# Patient Record
Sex: Male | Born: 1965 | Race: Black or African American | Hispanic: No | Smoking: Current some day smoker
Health system: Southern US, Community
[De-identification: ages and names within clinical notes are randomized; demographics above are authoritative.]

## PROBLEM LIST (undated history)

## (undated) DIAGNOSIS — F200 Paranoid schizophrenia: Secondary | ICD-10-CM

## (undated) DIAGNOSIS — C801 Malignant (primary) neoplasm, unspecified: Secondary | ICD-10-CM

## (undated) HISTORY — PX: STOMACH SURGERY: SHX791

---

## 2010-02-07 DIAGNOSIS — C801 Malignant (primary) neoplasm, unspecified: Secondary | ICD-10-CM

## 2010-02-07 HISTORY — DX: Malignant (primary) neoplasm, unspecified: C80.1

## 2011-06-13 ENCOUNTER — Emergency Department: Payer: Self-pay | Admitting: Emergency Medicine

## 2011-06-13 LAB — URINALYSIS, COMPLETE
Blood: NEGATIVE
Glucose,UR: NEGATIVE mg/dL (ref 0–75)
Leukocyte Esterase: NEGATIVE
Nitrite: NEGATIVE
Ph: 6 (ref 4.5–8.0)
Protein: NEGATIVE
RBC,UR: <1 /HPF (ref 0–5)
WBC UR: 1 /HPF (ref 0–5)

## 2011-06-13 LAB — COMPREHENSIVE METABOLIC PANEL
BUN: 15 mg/dL (ref 7–18)
Bilirubin,Total: 1.1 mg/dL — ABNORMAL HIGH (ref 0.2–1.0)
Calcium, Total: 8.9 mg/dL (ref 8.5–10.1)
Chloride: 108 mmol/L — ABNORMAL HIGH (ref 98–107)
Co2: 26 mmol/L (ref 21–32)
EGFR (Non-African Amer.): >60
Glucose: 97 mg/dL (ref 65–99)
Osmolality: 284 (ref 275–301)
Potassium: 3.8 mmol/L (ref 3.5–5.1)
Sodium: 142 mmol/L (ref 136–145)
Total Protein: 6.5 g/dL (ref 6.4–8.2)

## 2011-06-13 LAB — CBC
HGB: 15.2 g/dL (ref 13.0–18.0)
MCHC: 33.3 g/dL (ref 32.0–36.0)
MCV: 93 fL (ref 80–100)
Platelet: 222 10*3/uL (ref 150–440)
RDW: 13.6 % (ref 11.5–14.5)

## 2011-06-13 LAB — PROTIME-INR
INR: 0.9
Prothrombin Time: 12.9 secs (ref 11.5–14.7)

## 2011-07-29 ENCOUNTER — Emergency Department: Payer: Self-pay | Admitting: Emergency Medicine

## 2011-07-29 LAB — CBC
MCH: 31.3 pg (ref 26.0–34.0)
MCHC: 33.8 g/dL (ref 32.0–36.0)
MCV: 93 fL (ref 80–100)
Platelet: 237 10*3/uL (ref 150–440)
RBC: 4.77 10*6/uL (ref 4.40–5.90)
RDW: 13.6 % (ref 11.5–14.5)
WBC: 6.8 10*3/uL (ref 3.8–10.6)

## 2011-07-29 LAB — URINALYSIS, COMPLETE
Bacteria: NONE SEEN
Bilirubin,UR: NEGATIVE
Blood: NEGATIVE
Glucose,UR: NEGATIVE mg/dL (ref 0–75)
Ketone: NEGATIVE
Nitrite: NEGATIVE
Ph: 6 (ref 4.5–8.0)
Squamous Epithelial: <1
WBC UR: <1 /HPF (ref 0–5)

## 2011-07-29 LAB — COMPREHENSIVE METABOLIC PANEL
Anion Gap: 6 — ABNORMAL LOW (ref 7–16)
Co2: 26 mmol/L (ref 21–32)
Creatinine: 1.25 mg/dL (ref 0.60–1.30)
EGFR (African American): >60
Osmolality: 282 (ref 275–301)
Potassium: 3.7 mmol/L (ref 3.5–5.1)
SGOT(AST): 22 U/L (ref 15–37)
SGPT (ALT): 18 U/L

## 2011-08-19 ENCOUNTER — Ambulatory Visit: Payer: Self-pay | Admitting: Gastroenterology

## 2011-09-19 ENCOUNTER — Ambulatory Visit: Payer: Self-pay | Admitting: Surgery

## 2011-09-28 ENCOUNTER — Ambulatory Visit: Payer: Self-pay | Admitting: Surgery

## 2011-09-28 LAB — BASIC METABOLIC PANEL
Anion Gap: 3 — ABNORMAL LOW (ref 7–16)
Chloride: 110 mmol/L — ABNORMAL HIGH (ref 98–107)
Co2: 32 mmol/L (ref 21–32)
Creatinine: 1.18 mg/dL (ref 0.60–1.30)
Potassium: 3.9 mmol/L (ref 3.5–5.1)
Sodium: 145 mmol/L (ref 136–145)

## 2011-09-28 LAB — CBC
HGB: 14.8 g/dL (ref 13.0–18.0)
MCH: 32 pg (ref 26.0–34.0)
MCV: 92 fL (ref 80–100)
Platelet: 247 10*3/uL (ref 150–440)
RBC: 4.63 10*6/uL (ref 4.40–5.90)
RDW: 13.7 % (ref 11.5–14.5)

## 2011-10-04 ENCOUNTER — Inpatient Hospital Stay: Payer: Self-pay | Admitting: Surgery

## 2011-10-04 DIAGNOSIS — C169 Malignant neoplasm of stomach, unspecified: Secondary | ICD-10-CM | POA: Insufficient documentation

## 2011-10-04 LAB — BASIC METABOLIC PANEL
Anion Gap: 8 (ref 7–16)
Calcium, Total: 9.4 mg/dL (ref 8.5–10.1)
EGFR (African American): >60
EGFR (Non-African Amer.): >60
Glucose: 128 mg/dL — ABNORMAL HIGH (ref 65–99)
Osmolality: 279 (ref 275–301)

## 2011-10-04 LAB — CBC
HCT: 47.3 % (ref 40.0–52.0)
MCH: 31.8 pg (ref 26.0–34.0)
MCHC: 34.9 g/dL (ref 32.0–36.0)
MCV: 91 fL (ref 80–100)
RBC: 5.21 10*6/uL (ref 4.40–5.90)
RDW: 13.4 % (ref 11.5–14.5)

## 2011-10-05 LAB — CBC WITH DIFFERENTIAL/PLATELET
Basophil %: 0.3 %
Eosinophil %: 0 %
HCT: 40.1 % (ref 40.0–52.0)
HGB: 14.2 g/dL (ref 13.0–18.0)
Lymphocyte #: 1.6 10*3/uL (ref 1.0–3.6)
MCV: 91 fL (ref 80–100)
Monocyte %: 11.8 %
Neutrophil #: 6.8 10*3/uL — ABNORMAL HIGH (ref 1.4–6.5)
RBC: 4.4 10*6/uL (ref 4.40–5.90)
WBC: 9.6 10*3/uL (ref 3.8–10.6)

## 2011-10-05 LAB — BASIC METABOLIC PANEL
Anion Gap: 6 — ABNORMAL LOW (ref 7–16)
Calcium, Total: 8.7 mg/dL (ref 8.5–10.1)
Chloride: 103 mmol/L (ref 98–107)
Co2: 31 mmol/L (ref 21–32)
Creatinine: 1.27 mg/dL (ref 0.60–1.30)
EGFR (African American): >60
EGFR (Non-African Amer.): >60
Glucose: 145 mg/dL — ABNORMAL HIGH (ref 65–99)
Sodium: 140 mmol/L (ref 136–145)

## 2011-10-07 LAB — PATHOLOGY REPORT

## 2011-10-18 ENCOUNTER — Ambulatory Visit: Payer: Self-pay | Admitting: Hematology and Oncology

## 2011-10-18 LAB — COMPREHENSIVE METABOLIC PANEL
Albumin: 4.1 g/dL (ref 3.4–5.0)
Anion Gap: 4 — ABNORMAL LOW (ref 7–16)
BUN: 15 mg/dL (ref 7–18)
Glucose: 82 mg/dL (ref 65–99)
Potassium: 4.1 mmol/L (ref 3.5–5.1)
SGOT(AST): 24 U/L (ref 15–37)
Sodium: 142 mmol/L (ref 136–145)
Total Protein: 7.6 g/dL (ref 6.4–8.2)

## 2011-10-18 LAB — CBC CANCER CENTER
Basophil %: 2.1 %
Eosinophil #: 0.2 x10 3/mm (ref 0.0–0.7)
Eosinophil %: 3.3 %
HCT: 44.5 % (ref 40.0–52.0)
HGB: 15.6 g/dL (ref 13.0–18.0)
Lymphocyte #: 2.1 x10 3/mm (ref 1.0–3.6)
Lymphocyte %: 40.7 %
MCHC: 35 g/dL (ref 32.0–36.0)
MCV: 92 fL (ref 80–100)
Neutrophil #: 2.3 x10 3/mm (ref 1.4–6.5)
Neutrophil %: 43.6 %
RBC: 4.83 10*6/uL (ref 4.40–5.90)

## 2011-10-19 LAB — AFP TUMOR MARKER: AFP-Tumor Marker: 2.3 ng/mL (ref 0.0–8.3)

## 2011-11-08 ENCOUNTER — Ambulatory Visit: Payer: Self-pay | Admitting: Hematology and Oncology

## 2011-11-08 LAB — CBC CANCER CENTER
Basophil #: 0 x10 3/mm (ref 0.0–0.1)
Eosinophil #: 0.1 x10 3/mm (ref 0.0–0.7)
Lymphocyte %: 44.4 %
MCH: 31.1 pg (ref 26.0–34.0)
MCHC: 33.2 g/dL (ref 32.0–36.0)
Monocyte #: 0.5 x10 3/mm (ref 0.2–1.0)
Monocyte %: 9.4 %
Neutrophil %: 43.8 %
RBC: 4.8 10*6/uL (ref 4.40–5.90)
RDW: 14.1 % (ref 11.5–14.5)

## 2011-11-08 LAB — COMPREHENSIVE METABOLIC PANEL
Albumin: 4 g/dL (ref 3.4–5.0)
Alkaline Phosphatase: 102 U/L (ref 50–136)
Anion Gap: 12 (ref 7–16)
BUN: 15 mg/dL (ref 7–18)
Bilirubin,Total: 0.9 mg/dL (ref 0.2–1.0)
Calcium, Total: 9.1 mg/dL (ref 8.5–10.1)
Chloride: 104 mmol/L (ref 98–107)
Co2: 25 mmol/L (ref 21–32)
Creatinine: 1.22 mg/dL (ref 0.60–1.30)
EGFR (African American): >60
EGFR (Non-African Amer.): >60
Osmolality: 286 (ref 275–301)

## 2011-11-22 LAB — CBC CANCER CENTER
Basophil #: 0.1 x10 3/mm (ref 0.0–0.1)
Basophil %: 1.1 %
Eosinophil #: 0.1 x10 3/mm (ref 0.0–0.7)
Eosinophil %: 3.1 %
HCT: 47.1 % (ref 40.0–52.0)
HGB: 15.4 g/dL (ref 13.0–18.0)
Lymphocyte #: 2.3 x10 3/mm (ref 1.0–3.6)
Lymphocyte %: 49 %
MCV: 94 fL (ref 80–100)
Monocyte #: 0.5 x10 3/mm (ref 0.2–1.0)
Monocyte %: 10.7 %
Neutrophil #: 1.7 x10 3/mm (ref 1.4–6.5)
RBC: 5.03 10*6/uL (ref 4.40–5.90)
RDW: 13.8 % (ref 11.5–14.5)
WBC: 4.7 x10 3/mm (ref 3.8–10.6)

## 2011-12-09 ENCOUNTER — Ambulatory Visit: Payer: Self-pay | Admitting: Hematology and Oncology

## 2012-02-08 ENCOUNTER — Ambulatory Visit: Payer: Self-pay | Admitting: Hematology and Oncology

## 2012-02-28 LAB — COMPREHENSIVE METABOLIC PANEL
Alkaline Phosphatase: 102 U/L (ref 50–136)
Anion Gap: 9 (ref 7–16)
BUN: 15 mg/dL (ref 7–18)
Bilirubin,Total: 0.9 mg/dL (ref 0.2–1.0)
Chloride: 104 mmol/L (ref 98–107)
Co2: 30 mmol/L (ref 21–32)
Creatinine: 1.28 mg/dL (ref 0.60–1.30)
EGFR (African American): >60
EGFR (Non-African Amer.): >60
Glucose: 110 mg/dL — ABNORMAL HIGH (ref 65–99)
Osmolality: 286 (ref 275–301)
Potassium: 3.4 mmol/L — ABNORMAL LOW (ref 3.5–5.1)
SGOT(AST): 16 U/L (ref 15–37)
Sodium: 143 mmol/L (ref 136–145)

## 2012-02-28 LAB — CBC CANCER CENTER
Basophil #: 0 x10 3/mm (ref 0.0–0.1)
Basophil %: 0.1 %
Eosinophil %: 0.6 %
HCT: 44.7 % (ref 40.0–52.0)
MCH: 31.1 pg (ref 26.0–34.0)
MCHC: 34.1 g/dL (ref 32.0–36.0)
MCV: 91 fL (ref 80–100)
Monocyte #: 0.5 x10 3/mm (ref 0.2–1.0)
Monocyte %: 10.5 %
Neutrophil %: 40.2 %
RBC: 4.89 10*6/uL (ref 4.40–5.90)
WBC: 5.2 x10 3/mm (ref 3.8–10.6)

## 2012-03-10 ENCOUNTER — Ambulatory Visit: Payer: Self-pay | Admitting: Hematology and Oncology

## 2012-05-29 ENCOUNTER — Ambulatory Visit: Payer: Self-pay | Admitting: Hematology and Oncology

## 2012-05-29 LAB — CBC CANCER CENTER
Basophil #: 0.1 x10 3/mm (ref 0.0–0.1)
Basophil %: 1.3 %
HGB: 15.1 g/dL (ref 13.0–18.0)
Lymphocyte #: 2.5 x10 3/mm (ref 1.0–3.6)
Lymphocyte %: 37.8 %
MCH: 31.5 pg (ref 26.0–34.0)
Monocyte #: 0.5 x10 3/mm (ref 0.2–1.0)
Neutrophil #: 3.3 x10 3/mm (ref 1.4–6.5)
Neutrophil %: 50.1 %
RDW: 13.9 % (ref 11.5–14.5)
WBC: 6.5 x10 3/mm (ref 3.8–10.6)

## 2012-05-29 LAB — BASIC METABOLIC PANEL
BUN: 15 mg/dL (ref 7–18)
Co2: 26 mmol/L (ref 21–32)
Glucose: 111 mg/dL — ABNORMAL HIGH (ref 65–99)
Osmolality: 285 (ref 275–301)
Sodium: 142 mmol/L (ref 136–145)

## 2012-05-30 LAB — CEA: CEA: 5.5 ng/mL — ABNORMAL HIGH (ref 0.0–4.7)

## 2012-06-07 ENCOUNTER — Ambulatory Visit: Payer: Self-pay | Admitting: Hematology and Oncology

## 2012-07-08 ENCOUNTER — Ambulatory Visit: Payer: Self-pay | Admitting: Hematology and Oncology

## 2012-07-19 ENCOUNTER — Ambulatory Visit: Payer: Self-pay | Admitting: Hematology and Oncology

## 2012-07-25 LAB — CBC CANCER CENTER
Basophil #: 0.1 x10 3/mm (ref 0.0–0.1)
Eosinophil %: 3.3 %
HCT: 43.6 % (ref 40.0–52.0)
HGB: 15.2 g/dL (ref 13.0–18.0)
Lymphocyte #: 2.4 x10 3/mm (ref 1.0–3.6)
MCH: 32.2 pg (ref 26.0–34.0)
MCV: 92 fL (ref 80–100)
Monocyte #: 0.5 x10 3/mm (ref 0.2–1.0)
Monocyte %: 7.5 %
Neutrophil #: 3.8 x10 3/mm (ref 1.4–6.5)
Platelet: 255 x10 3/mm (ref 150–440)
RBC: 4.73 10*6/uL (ref 4.40–5.90)
RDW: 13.9 % (ref 11.5–14.5)

## 2012-07-25 LAB — COMPREHENSIVE METABOLIC PANEL
Alkaline Phosphatase: 98 U/L (ref 50–136)
BUN: 17 mg/dL (ref 7–18)
Bilirubin,Total: 0.6 mg/dL (ref 0.2–1.0)
Calcium, Total: 8.8 mg/dL (ref 8.5–10.1)
Chloride: 106 mmol/L (ref 98–107)
Creatinine: 1.29 mg/dL (ref 0.60–1.30)
EGFR (African American): >60
EGFR (Non-African Amer.): >60
Osmolality: 278 (ref 275–301)
Potassium: 3.9 mmol/L (ref 3.5–5.1)
SGOT(AST): 17 U/L (ref 15–37)
SGPT (ALT): 26 U/L (ref 12–78)
Sodium: 138 mmol/L (ref 136–145)
Total Protein: 7 g/dL (ref 6.4–8.2)

## 2012-08-07 ENCOUNTER — Ambulatory Visit: Payer: Self-pay | Admitting: Hematology and Oncology

## 2012-10-17 ENCOUNTER — Emergency Department: Payer: Self-pay | Admitting: Internal Medicine

## 2012-10-17 ENCOUNTER — Emergency Department: Payer: Self-pay

## 2012-10-17 LAB — URINALYSIS, COMPLETE
Bilirubin,UR: NEGATIVE
Bilirubin,UR: NEGATIVE
Blood: NEGATIVE
Blood: NEGATIVE
Glucose,UR: NEGATIVE mg/dL
Glucose,UR: NEGATIVE mg/dL (ref 0–75)
Ketone: NEGATIVE
Ketone: NEGATIVE
Leukocyte Esterase: NEGATIVE
Leukocyte Esterase: NEGATIVE
Nitrite: NEGATIVE
Nitrite: NEGATIVE
Ph: 6
Ph: 6 (ref 4.5–8.0)
Protein: NEGATIVE
Protein: NEGATIVE
RBC,UR: <1 /HPF
Specific Gravity: 1.013
Squamous Epithelial: NONE SEEN
Squamous Epithelial: NONE SEEN
WBC UR: 1 /HPF

## 2012-10-17 LAB — COMPREHENSIVE METABOLIC PANEL WITH GFR
Albumin: 3.7 g/dL
Alkaline Phosphatase: 94 U/L
Anion Gap: 7
BUN: 15 mg/dL
Bilirubin,Total: 0.3 mg/dL
Calcium, Total: 9.1 mg/dL
Chloride: 106 mmol/L
Co2: 26 mmol/L
Creatinine: 1.29 mg/dL
EGFR (African American): >60
EGFR (Non-African Amer.): >60
Glucose: 100 mg/dL — ABNORMAL HIGH
Osmolality: 278
Potassium: 3.8 mmol/L
SGOT(AST): 22 U/L
SGPT (ALT): 21 U/L
Sodium: 139 mmol/L
Total Protein: 6.8 g/dL

## 2012-10-17 LAB — CBC
HCT: 42.1 %
HCT: 42.3 % (ref 40.0–52.0)
HGB: 14.6 g/dL
HGB: 14.6 g/dL (ref 13.0–18.0)
MCH: 32.3 pg
MCH: 32.1 pg (ref 26.0–34.0)
MCHC: 34.8 g/dL
MCV: 93 fL
MCV: 93 fL (ref 80–100)
Platelet: 258 x10 3/mm 3
RBC: 4.54 x10 6/mm 3
RBC: 4.56 10*6/uL (ref 4.40–5.90)
RDW: 14.6 % — ABNORMAL HIGH
RDW: 14.3 % (ref 11.5–14.5)
WBC: 9.6 x10 3/mm 3
WBC: 7.6 10*3/uL (ref 3.8–10.6)

## 2012-10-17 LAB — BASIC METABOLIC PANEL
Anion Gap: 5 — ABNORMAL LOW (ref 7–16)
Calcium, Total: 8.8 mg/dL (ref 8.5–10.1)
Chloride: 107 mmol/L (ref 98–107)
Creatinine: 1.28 mg/dL (ref 0.60–1.30)
EGFR (Non-African Amer.): >60
Glucose: 132 mg/dL — ABNORMAL HIGH (ref 65–99)
Osmolality: 281 (ref 275–301)

## 2012-10-17 LAB — DRUG SCREEN, URINE
Barbiturates, Ur Screen: NEGATIVE (ref ?–200)
Benzodiazepine, Ur Scrn: NEGATIVE (ref ?–200)
Cannabinoid 50 Ng, Ur ~~LOC~~: NEGATIVE (ref ?–50)
Cocaine Metabolite,Ur ~~LOC~~: NEGATIVE (ref ?–300)
MDMA (Ecstasy)Ur Screen: NEGATIVE (ref ?–500)
Opiate, Ur Screen: POSITIVE (ref ?–300)
Phencyclidine (PCP) Ur S: NEGATIVE (ref ?–25)
Tricyclic, Ur Screen: NEGATIVE (ref ?–1000)

## 2012-10-17 LAB — TROPONIN I: Troponin-I: <0.02 ng/mL

## 2012-10-22 ENCOUNTER — Emergency Department: Payer: Self-pay | Admitting: Emergency Medicine

## 2012-10-22 ENCOUNTER — Ambulatory Visit: Payer: Self-pay | Admitting: Hematology and Oncology

## 2012-10-23 ENCOUNTER — Ambulatory Visit: Payer: Self-pay | Admitting: Hematology and Oncology

## 2012-11-17 ENCOUNTER — Emergency Department: Payer: Self-pay | Admitting: Emergency Medicine

## 2013-01-24 ENCOUNTER — Ambulatory Visit: Payer: Self-pay | Admitting: Hematology and Oncology

## 2013-01-24 LAB — COMPREHENSIVE METABOLIC PANEL
Albumin: 3.9 g/dL (ref 3.4–5.0)
Anion Gap: 9 (ref 7–16)
BUN: 17 mg/dL (ref 7–18)
Chloride: 102 mmol/L (ref 98–107)
Co2: 28 mmol/L (ref 21–32)
Creatinine: 1.46 mg/dL — ABNORMAL HIGH (ref 0.60–1.30)
SGOT(AST): 13 U/L — ABNORMAL LOW (ref 15–37)
SGPT (ALT): 22 U/L (ref 12–78)
Sodium: 139 mmol/L (ref 136–145)
Total Protein: 6.9 g/dL (ref 6.4–8.2)

## 2013-01-24 LAB — CBC CANCER CENTER
Basophil %: 1 %
Eosinophil #: 0.1 x10 3/mm (ref 0.0–0.7)
Eosinophil %: 1.9 %
Lymphocyte #: 2.2 x10 3/mm (ref 1.0–3.6)
Lymphocyte %: 43.2 %
MCHC: 33 g/dL (ref 32.0–36.0)
Monocyte #: 0.5 x10 3/mm (ref 0.2–1.0)
Monocyte %: 9.9 %
Neutrophil %: 44 %
Platelet: 252 x10 3/mm (ref 150–440)
RBC: 4.97 10*6/uL (ref 4.40–5.90)
WBC: 5.1 x10 3/mm (ref 3.8–10.6)

## 2013-01-25 LAB — CEA: CEA: 4.9 ng/mL — ABNORMAL HIGH (ref 0.0–4.7)

## 2013-02-07 ENCOUNTER — Ambulatory Visit: Payer: Self-pay | Admitting: Hematology and Oncology

## 2013-03-02 ENCOUNTER — Emergency Department: Payer: Self-pay | Admitting: Emergency Medicine

## 2013-04-17 ENCOUNTER — Ambulatory Visit: Payer: Self-pay | Admitting: Hematology and Oncology

## 2013-04-18 LAB — COMPREHENSIVE METABOLIC PANEL
AST: 16 U/L (ref 15–37)
Albumin: 3.9 g/dL (ref 3.4–5.0)
Alkaline Phosphatase: 86 U/L
Anion Gap: 8 (ref 7–16)
BILIRUBIN TOTAL: 0.9 mg/dL (ref 0.2–1.0)
BUN: 21 mg/dL — ABNORMAL HIGH (ref 7–18)
CO2: 30 mmol/L (ref 21–32)
Calcium, Total: 9.4 mg/dL (ref 8.5–10.1)
Chloride: 106 mmol/L (ref 98–107)
Creatinine: 1.44 mg/dL — ABNORMAL HIGH (ref 0.60–1.30)
EGFR (African American): >60
GFR CALC NON AF AMER: 58 — AB
Glucose: 96 mg/dL (ref 65–99)
Osmolality: 290 (ref 275–301)
POTASSIUM: 4.7 mmol/L (ref 3.5–5.1)
SGPT (ALT): 18 U/L (ref 12–78)
Sodium: 144 mmol/L (ref 136–145)
Total Protein: 7 g/dL (ref 6.4–8.2)

## 2013-04-18 LAB — CBC CANCER CENTER
BASOS ABS: 0.1 x10 3/mm (ref 0.0–0.1)
BASOS PCT: 1.3 %
Eosinophil #: 0.2 x10 3/mm (ref 0.0–0.7)
Eosinophil %: 2.8 %
HCT: 47.6 % (ref 40.0–52.0)
HGB: 15.5 g/dL (ref 13.0–18.0)
LYMPHS PCT: 51.8 %
Lymphocyte #: 2.9 x10 3/mm (ref 1.0–3.6)
MCH: 30.9 pg (ref 26.0–34.0)
MCHC: 32.5 g/dL (ref 32.0–36.0)
MCV: 95 fL (ref 80–100)
MONO ABS: 0.7 x10 3/mm (ref 0.2–1.0)
MONOS PCT: 12 %
NEUTROS PCT: 32.1 %
Neutrophil #: 1.8 x10 3/mm (ref 1.4–6.5)
PLATELETS: 231 x10 3/mm (ref 150–440)
RBC: 5.01 10*6/uL (ref 4.40–5.90)
RDW: 14.3 % (ref 11.5–14.5)
WBC: 5.6 x10 3/mm (ref 3.8–10.6)

## 2013-04-19 LAB — CEA: CEA: 4.8 ng/mL — AB (ref 0.0–4.7)

## 2013-04-29 ENCOUNTER — Emergency Department: Payer: Self-pay | Admitting: Emergency Medicine

## 2013-05-08 ENCOUNTER — Ambulatory Visit: Payer: Self-pay | Admitting: Hematology and Oncology

## 2013-10-11 IMAGING — CT CT ABD-PELV W/ CM
1 of 3 series · 14 of 32 positions shown, 19 images · IV contrast (isovue)
Comparison: none

REASON FOR EXAM: (1) h/o partial gastrectomy with LUQ abd pain eval; (2)
h/o partial gastrectomy
COMMENTS:

PROCEDURE:     CT  - CT ABDOMEN / PELVIS  W  - October 17, 2012  [DATE]
RESULT:     CT abdomen pelvis dated 10/17/2012 comparison made to prior study
dated 06/12/2012.
TECHNIQUE: Helical 3 mm sections were obtained from the lung bases through
the pubic symphysis status post intravenous administration of 100 mL of
Isovue-J79. The patient also received oral contrast.

[Series 2: 3mm soft tissue · axial · 0.68mm/px · z∈[+58,+452]mm · 14 of 147 slices shown, 19 images]
[im 8/147  soft-tissue]
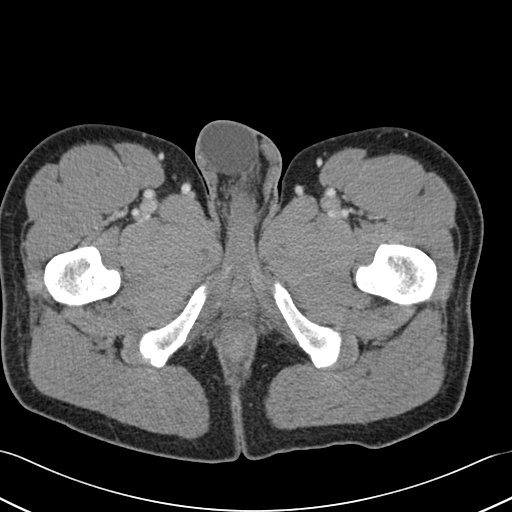
[im 8/147  bone]
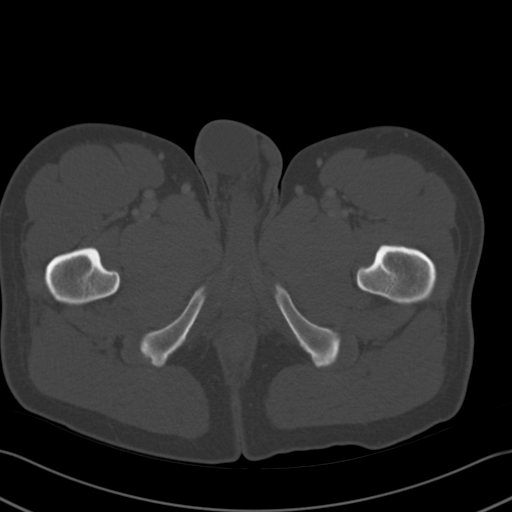
[im 24/147  soft-tissue]
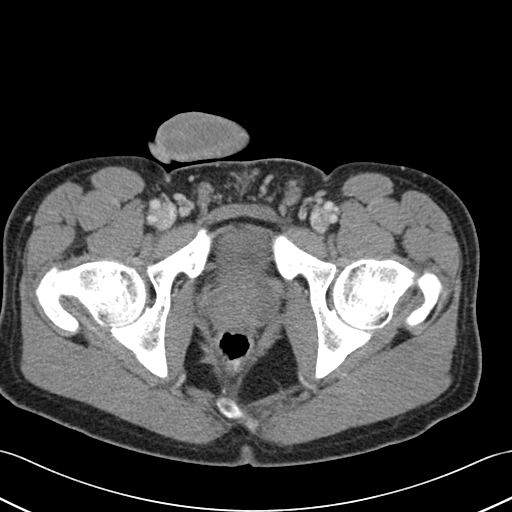
[im 31/147  soft-tissue]
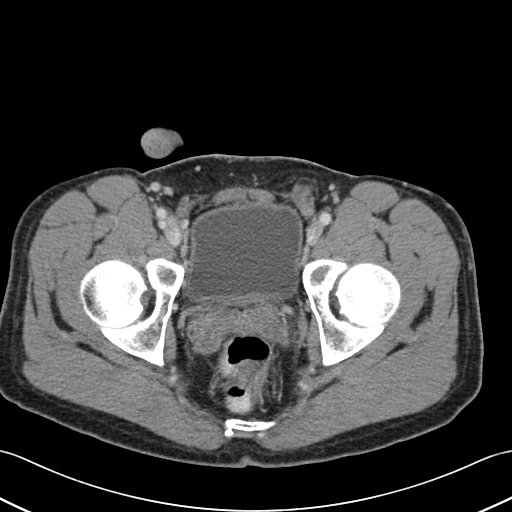
[im 39/147  soft-tissue]
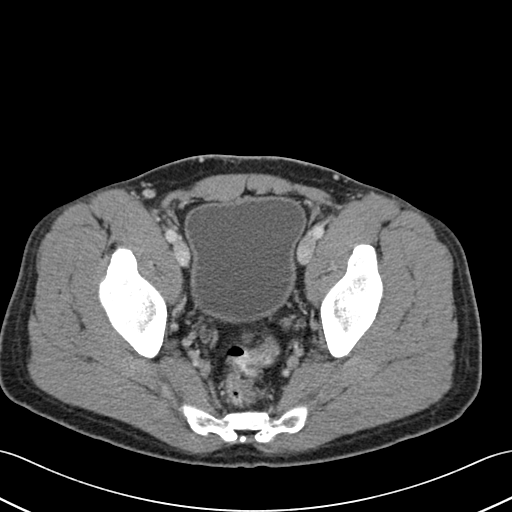
[im 54/147  soft-tissue]
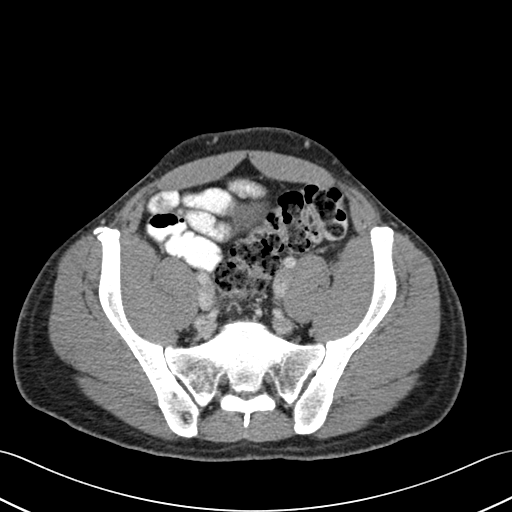
[im 62/147  soft-tissue]
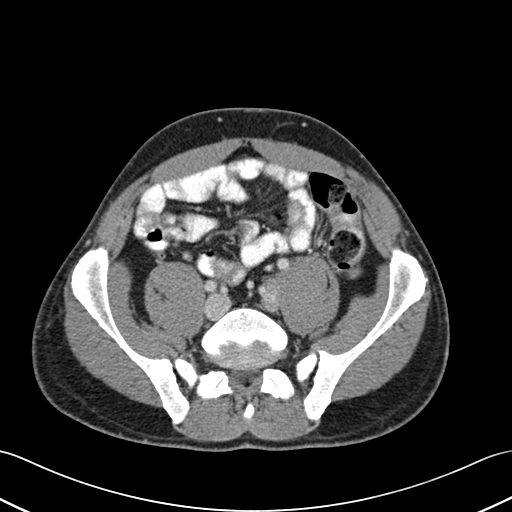
[im 77/147  soft-tissue]
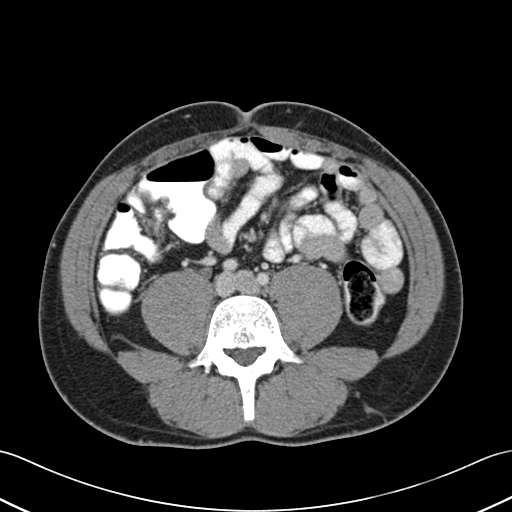
[im 85/147  soft-tissue]
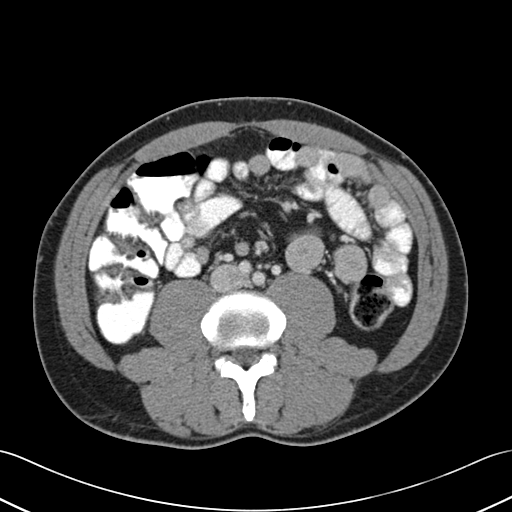
[im 93/147  soft-tissue]
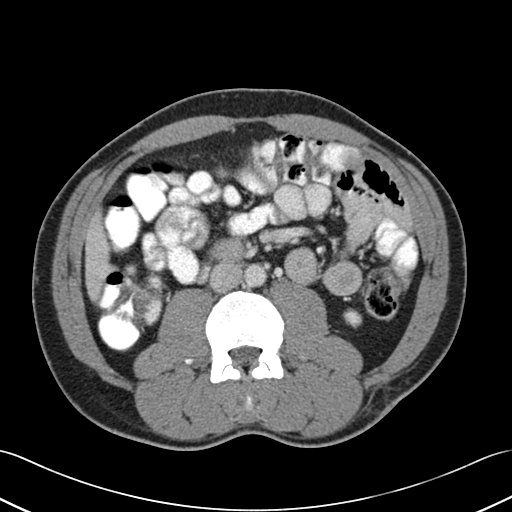
[im 93/147  bone]
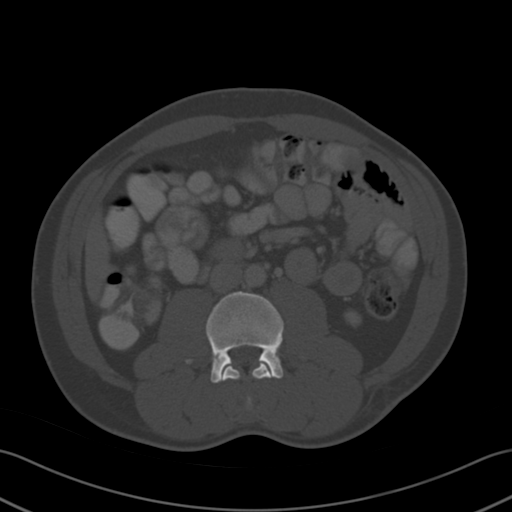
[im 108/147  soft-tissue]
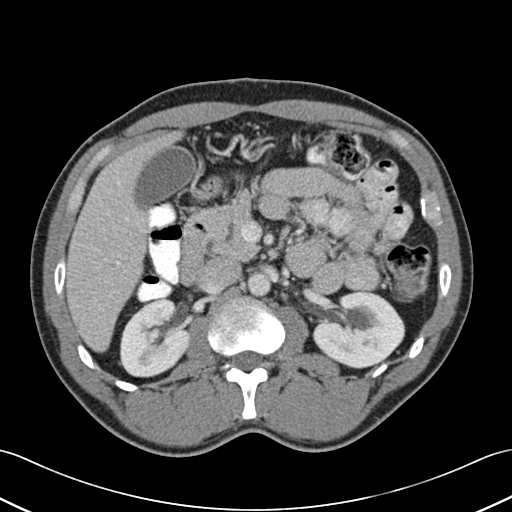
[im 116/147  soft-tissue]
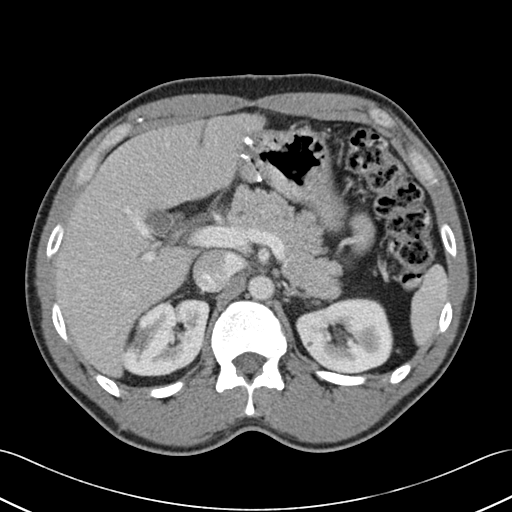
[im 116/147  lung]
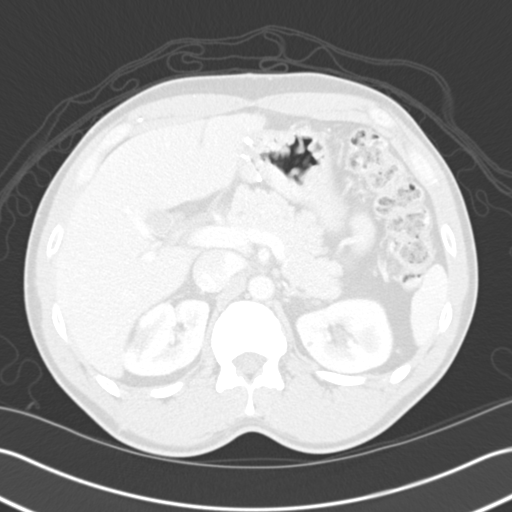
[im 123/147  soft-tissue]
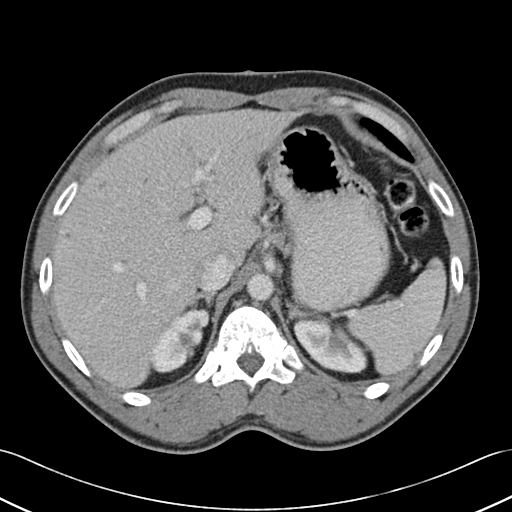
[im 123/147  lung]
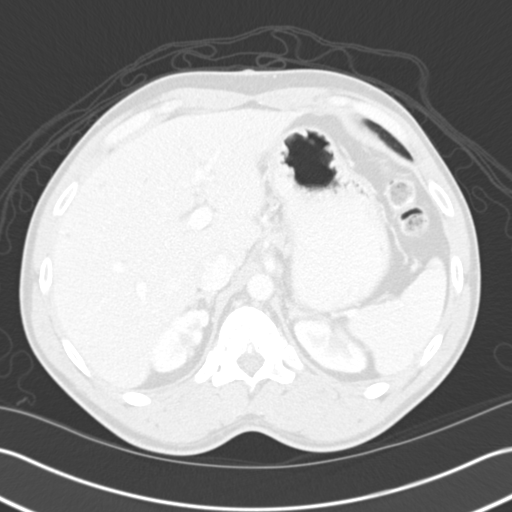
[im 131/147  lung]
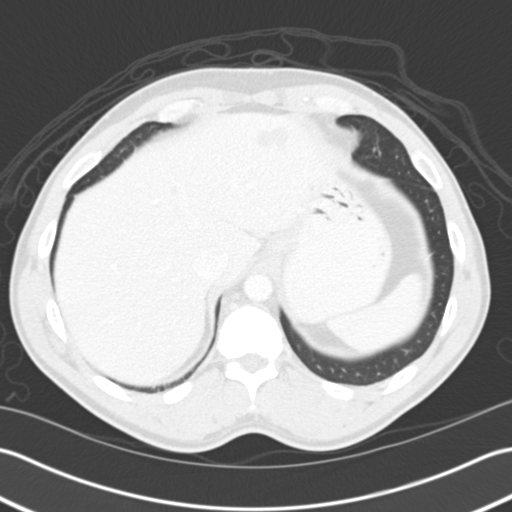
[im 139/147  soft-tissue]
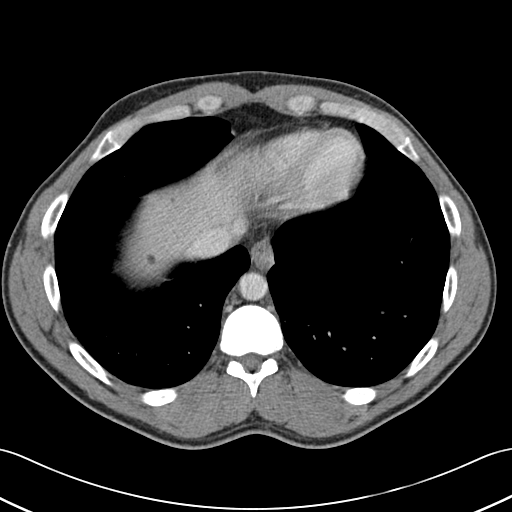
[im 139/147  lung]
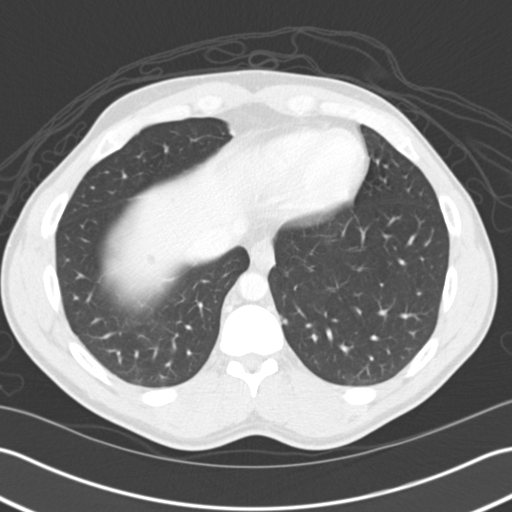

[14 of 32 positions shown; findings below may reference images not displayed]

FINDINGS: Calcified granulomas identified within the left lung base compare
stable. Lung bases otherwise unremarkable.

Stable hemangiomas are found in the left lobe of the liver. Small
nonenhancing low attenuating foci identified scattered throughout the liver
different considerations of small cyst versus small atypical hemangiomas.
The liver is otherwise unremarkable. The spleen, adrenals, pancreas
unremarkable. A cyst is identified within the left kidney. The kidney is
otherwise unremarkable. Postsurgical changes identified within the stomach
consistent patient's history. There is no evidence of bowel obstruction,
enteritis, colitis, diverticulitis common nor appendicitis. A moderate
amount of fecal retention is appreciated within the colon. The celiac, SMA,
IMA, portal vein, SMV are opacified. There is no evidence of abdominal
aortic aneurysm.
IMPRESSION: No CT evidence of obstructive or inflammatory abnormalities.
2. Fecal retention within the colon.
3. Left renal cyst
4. Hemangioma left lobe of the liver likely small cysts scattered throughout
the liver
5. Granulomatous changes within the left lung base

## 2013-11-17 ENCOUNTER — Emergency Department: Payer: Self-pay | Admitting: Emergency Medicine

## 2013-11-19 ENCOUNTER — Ambulatory Visit: Payer: Self-pay | Admitting: Internal Medicine

## 2013-11-19 LAB — CBC CANCER CENTER
Basophil #: 0.1 x10 3/mm (ref 0.0–0.1)
Basophil %: 1.3 %
EOS ABS: 0.1 x10 3/mm (ref 0.0–0.7)
Eosinophil %: 2.5 %
HCT: 45.9 % (ref 40.0–52.0)
HGB: 15.3 g/dL (ref 13.0–18.0)
LYMPHS ABS: 2.1 x10 3/mm (ref 1.0–3.6)
LYMPHS PCT: 38.3 %
MCH: 32 pg (ref 26.0–34.0)
MCHC: 33.3 g/dL (ref 32.0–36.0)
MCV: 96 fL (ref 80–100)
MONO ABS: 0.4 x10 3/mm (ref 0.2–1.0)
Monocyte %: 8.3 %
NEUTROS ABS: 2.7 x10 3/mm (ref 1.4–6.5)
Neutrophil %: 49.6 %
PLATELETS: 249 x10 3/mm (ref 150–440)
RBC: 4.78 10*6/uL (ref 4.40–5.90)
RDW: 13.7 % (ref 11.5–14.5)
WBC: 5.4 x10 3/mm (ref 3.8–10.6)

## 2013-11-19 LAB — COMPREHENSIVE METABOLIC PANEL
ALBUMIN: 3.8 g/dL (ref 3.4–5.0)
ALT: 25 U/L
Alkaline Phosphatase: 90 U/L
Anion Gap: 6 — ABNORMAL LOW (ref 7–16)
BUN: 13 mg/dL (ref 7–18)
Bilirubin,Total: 0.5 mg/dL (ref 0.2–1.0)
CALCIUM: 9 mg/dL (ref 8.5–10.1)
CHLORIDE: 106 mmol/L (ref 98–107)
CO2: 27 mmol/L (ref 21–32)
Creatinine: 1.41 mg/dL — ABNORMAL HIGH (ref 0.60–1.30)
EGFR (Non-African Amer.): 58 — ABNORMAL LOW
GLUCOSE: 79 mg/dL (ref 65–99)
OSMOLALITY: 277 (ref 275–301)
POTASSIUM: 3.6 mmol/L (ref 3.5–5.1)
SGOT(AST): 16 U/L (ref 15–37)
Sodium: 139 mmol/L (ref 136–145)
Total Protein: 6.8 g/dL (ref 6.4–8.2)

## 2013-11-21 LAB — CEA: CEA: 5.6 ng/mL — ABNORMAL HIGH (ref 0.0–4.7)

## 2013-12-03 ENCOUNTER — Ambulatory Visit: Payer: Self-pay | Admitting: Gastroenterology

## 2013-12-08 ENCOUNTER — Ambulatory Visit: Payer: Self-pay | Admitting: Internal Medicine

## 2014-02-14 ENCOUNTER — Emergency Department: Payer: Self-pay | Admitting: Emergency Medicine

## 2014-04-24 IMAGING — CR CERVICAL SPINE - 2-3 VIEW
1 series · 6 of 6 positions shown · non-contrast
Comparison: NM PET KYAW MYO MATHAN (PS) SKULL BASE T - THIGH dated
09/19/2011

CLINICAL DATA: Right-sided neck pain for 2 weeks extending into the
right shoulder and arm.

EXAM:
CERVICAL SPINE - 2-3 VIEW

[Series 1: w cervical spine lat · 0.14mm/px · 6 of 6 slices shown]
[im 1/6]
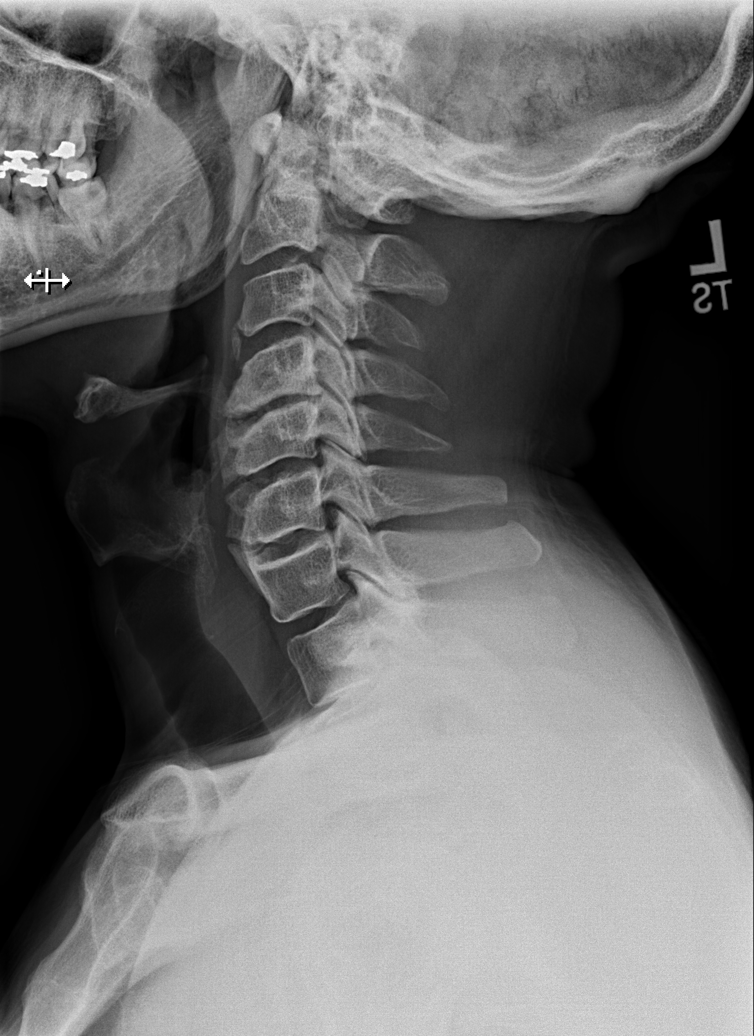
[im 2/6]
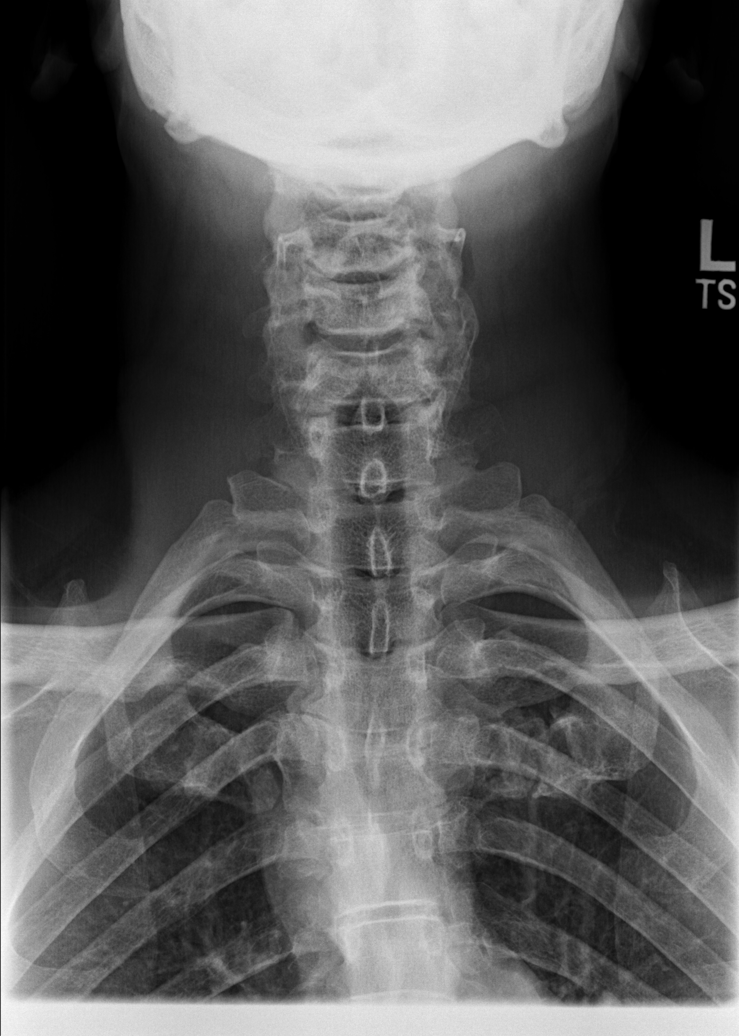
[im 3/6]
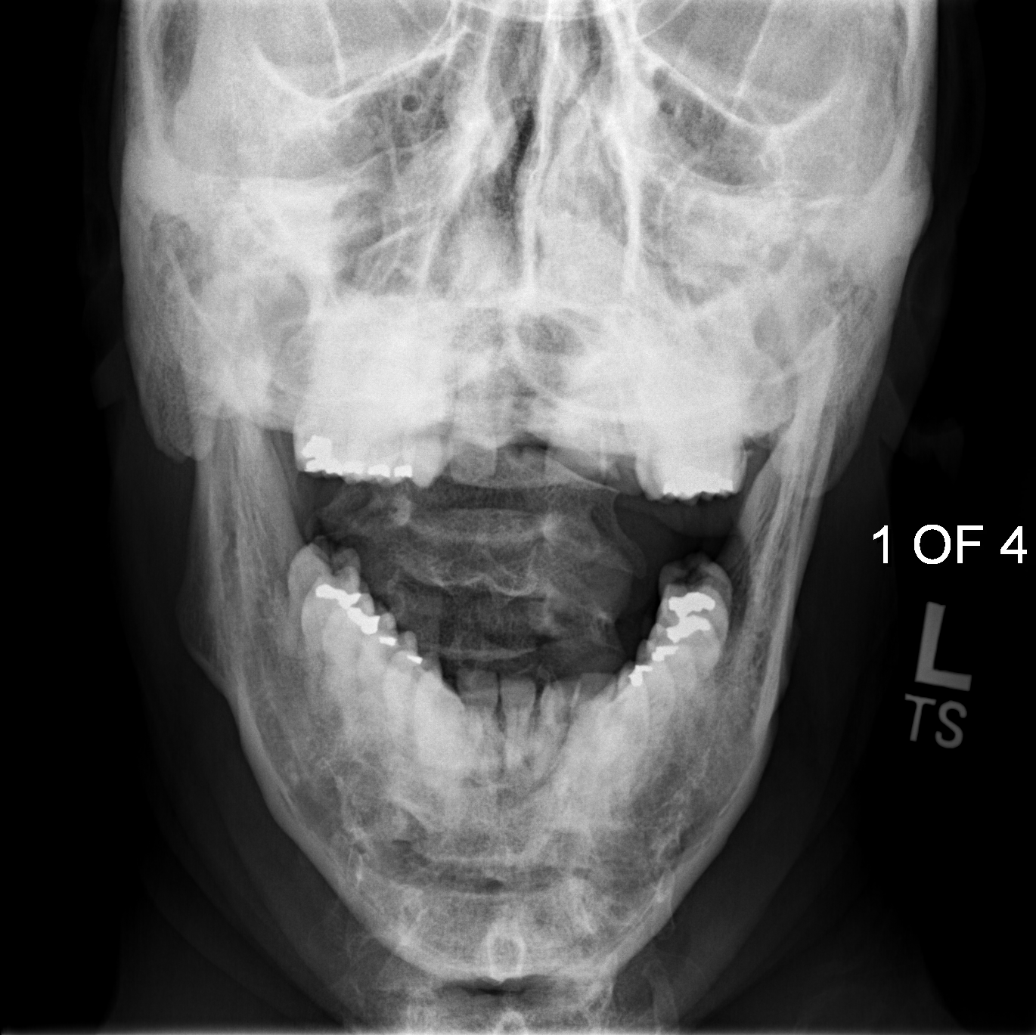
[im 4/6]
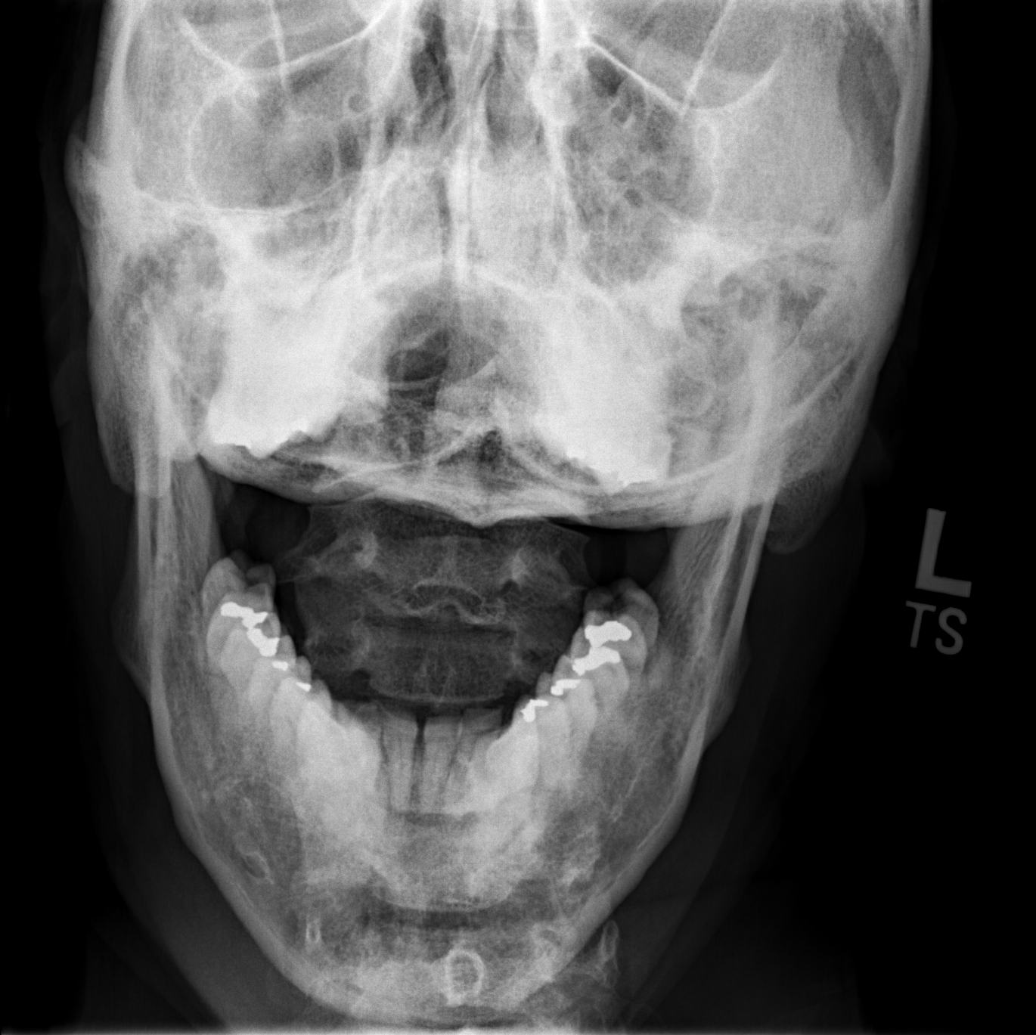
[im 5/6]
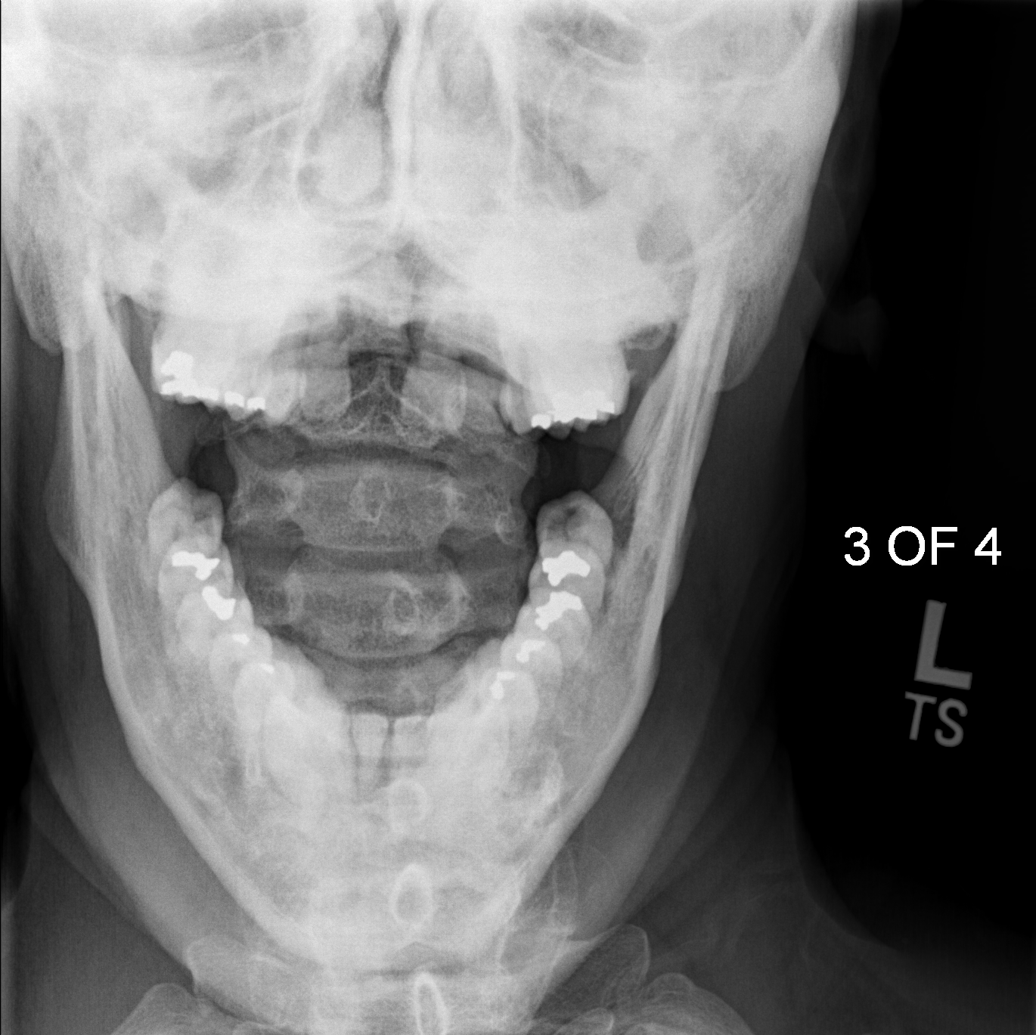
[im 6/6]
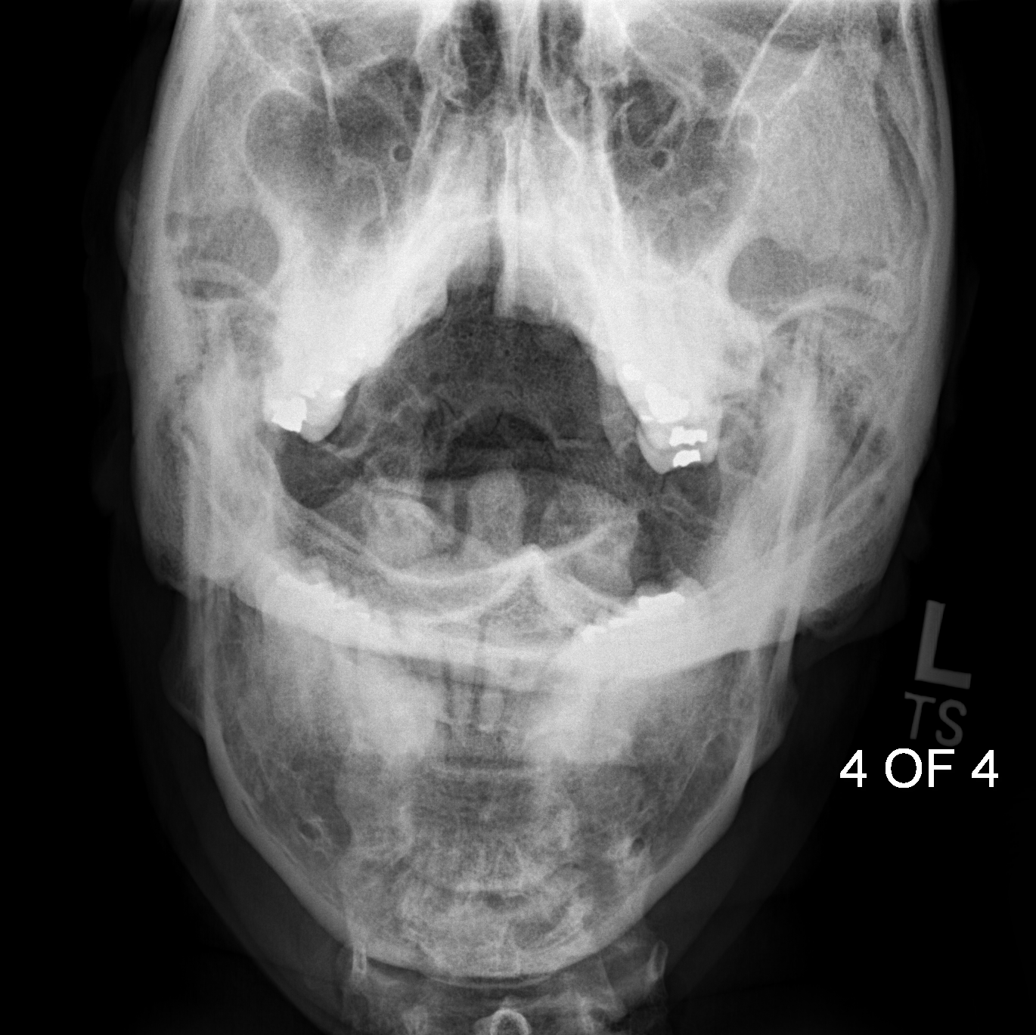

[6 of 6 positions shown; findings below may reference images not displayed]

FINDINGS: Vertebral alignment is normal. Prevertebral soft tissues are within
normal limits. The dens appears intact. No cervical spine fracture
is identified. There is multilevel anterior and, to a lesser extent,
posterior endplate spurring throughout the mid and lower cervical
spine. There is moderate disc space narrowing at C4-5. Facet
arthrosis is present at C7-T1. The visualized lung apices are clear.
IMPRESSION: No acute osseous abnormality identified. Multilevel degenerative
disc disease, greatest at C4-5.

## 2014-05-20 ENCOUNTER — Ambulatory Visit
Admit: 2014-05-20 | Disposition: A | Discharge status: Home / Self Care | Payer: Self-pay | Attending: Hematology and Oncology | Admitting: Hematology and Oncology

## 2014-05-20 LAB — COMPREHENSIVE METABOLIC PANEL
Albumin: 4.2 g/dL
Alkaline Phosphatase: 75 U/L
Anion Gap: 6 — ABNORMAL LOW (ref 7–16)
BUN: 17 mg/dL
Bilirubin,Total: 1 mg/dL
Calcium, Total: 9.5 mg/dL
Chloride: 103 mmol/L
Co2: 29 mmol/L
Creatinine: 1.24 mg/dL
EGFR (African American): >60
EGFR (Non-African Amer.): >60
Glucose: 69 mg/dL
Potassium: 4.3 mmol/L
SGOT(AST): 28 U/L
SGPT (ALT): 21 U/L
Sodium: 138 mmol/L
Total Protein: 6.9 g/dL

## 2014-05-20 LAB — CBC CANCER CENTER
Basophil #: 0.1 x10 3/mm (ref 0.0–0.1)
Basophil %: 1.3 %
Eosinophil #: 0.1 x10 3/mm (ref 0.0–0.7)
Eosinophil %: 2 %
HCT: 45.9 % (ref 40.0–52.0)
HGB: 15.4 g/dL (ref 13.0–18.0)
Lymphocyte #: 2.1 x10 3/mm (ref 1.0–3.6)
Lymphocyte %: 41.4 %
MCH: 32 pg (ref 26.0–34.0)
MCHC: 33.6 g/dL (ref 32.0–36.0)
MCV: 95 fL (ref 80–100)
Monocyte #: 0.6 x10 3/mm (ref 0.2–1.0)
Monocyte %: 11.1 %
Neutrophil #: 2.2 x10 3/mm (ref 1.4–6.5)
Neutrophil %: 44.2 %
Platelet: 250 x10 3/mm (ref 150–440)
RBC: 4.83 10*6/uL (ref 4.40–5.90)
RDW: 13.9 % (ref 11.5–14.5)
WBC: 5 x10 3/mm (ref 3.8–10.6)

## 2014-05-21 LAB — CEA: CEA: 5.2 ng/mL — ABNORMAL HIGH (ref 0.0–4.7)

## 2014-05-27 NOTE — Discharge Summary (Signed)
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 His symptoms included pain. He had a preop PET scan, which showed no disease in the liver.   PAST MEDICAL HISTORY: Generally he has been in good health. He had recent treatment for Helicobacter pylori infection.  HOSPITAL COURSE:  The patient was brought in through the outpatient surgery department and carried to the operating room where he had a distal gastrectomy with Billroth I anastomosis.   Postoperatively he was treated with IV fluids and subcutaneous heparin. As noted, he did have a preop dose of intravenous Invanz as prophylaxis.   During the postoperative course he made satisfactory progress, ambulated in the hall. He was begun on a clear liquid diet and gradually advanced and tolerated his diet well. His wound progressed satisfactorily.   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 There was no cancer found in nine regional lymph nodes.   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  DISCHARGE INSTRUCTIONS:  1. Take Tylenol if needed for minor pain, Norco as needed for more pain.  2. Take a regular diet, avoid large meals. 3. Follow up in the office. 4. Also anticipate oncology consultation.  ___________________________ Lenna Sciara. Rochel Brome, MD jws:bjt D:  10/10/2011 10:36:04 ET          T: 10/11/2011 13:38:34 ET         JOB#: [id number]  Loreli Dollar MD ELECTRONICALLY SIGNED 10/12/2011 12:04

## 2014-05-27 NOTE — Op Note (Signed)
PATIENT NAME:  Andrew Peters, Andrew Peters MR#:  [id number] DATE OF BIRTH:  08-21-1965  DATE OF PROCEDURE:  10/04/2011  PREOPERATIVE DIAGNOSIS: Carcinoma of the stomach.   POSTOPERATIVE DIAGNOSIS: Carcinoma of the stomach.   PROCEDURE: Distal gastrectomy.   SURGEON: Rochel Brome, M.D.   ANESTHESIA: General.   INDICATIONS: This 49 year old male has a history of abdominal pain, also has had upper endoscopy demonstrating a large cratered gastric ulcer in the prepyloric area. Biopsies demonstrated intramucosal adenocarcinoma and also Helicobacter. He was treated with doxycycline and metronidazole and also omeprazole. PET scan showed no disease outside the stomach. Surgery was recommended for definitive treatment.   DESCRIPTION OF PROCEDURE: The patient was placed on the operating table in the supine position under general endotracheal anesthesia. The abdomen was prepared with ChloraPrep and draped in a sterile manner.   An upper abdominal midline incision was made from the xiphoid process to the umbilicus and slightly to the left of the umbilicus and carried down through subcutaneous tissues. The midline fascia was incised and peritoneum was incised and the abdominal cavity was opened. Initial inspection revealed there was some mild distention of the stomach and I had the anesthetist insert an orogastric tube, draining a small amount of gastric contents and gas. There was a palpable mass in the prepyloric region of the stomach which was some 3 to 4 cm in dimension There was no grossly palpable mass within the liver The upper aspect of the stomach was also palpated and did not identify any other mass. There was no adenopathy identified in the region of the lesser curvature. The stomach was further elevated and was further exposed with insertion of the Balfour retractor. The pylorus was identified. There was no palpable mass within the duodenum. Duodenum was soft and pliable. Pancreas was soft and pliable.   A  site for the proximal margin of resection along the greater curvature was selected so that approximately 40% of the stomach would be removed and a window was created in the mesentery. The Harmonic scalpel was used for portions of the dissection. A portion of omentum was resected as it would lose blood supply with the partial gastrectomy. This was done with the Harmonic scalpel. It was dissected off the transverse colon and placed into formalin to accompany the stomach.  Next, the greater curvature was dissected with the Harmonic scalpel and dissection was carried down to the duodenum. There were a few small palpable lymph nodes at the greater curvature near the duodenum which were included in the resection. Circumferential dissection was carried out around the duodenum approximately 2 cm distal to the pylorus and there was no palpable mass in the duodenum. Next, a site for the proximal margin of resection was selected along the greater curvature and the portion of the lesser omentum was resected with the stomach using the Harmonic scalpel. A number of vascular pedicles were also suture ligated with 3-0 chromic. Next, the pursestring clamp was placed across the duodenum and the nylon sutures were inserted and pulled through. Next, the duodenum was divided sharply with a scalpel and the distal stomach was elevated with a Kocher clamp. Next, an incision was made in the anterior wall of the stomach and you could see into the stomach and see the site of the mass and could see there was normal mucosa proximal to it. The pursestring clamp was removed. The edges of the duodenum were held up with Allis clamps. The duodenal mucosa appeared normal. Small and medium sizers were inserted and  I selected the 29-mm EEA stapler. The anvil was removed. The stapler was introduced through the anterior gastrotomy and brought out through the posterior wall, advancing the pin through the posterior wall at the intended site of anastomosis,  and then the anvil was attached. The anvil was placed into the duodenum and the pursestring tied down. Next, the EEA was engaged and activated, creating the circular anastomosis between the posterior wall of the stomach and the duodenum. There was no tension on this. The anastomotic rings were intact. The duodenal ring was sent as distal margin of resection in the container with the omentum. Next, the TA-90 stapler was placed across the stomach some 2.5 cm distal to the anastomosis and also was at the margin of the mesenteric dissection, engaged, and activated, and the stomach was excised with a scalpel. It was examined on a side table and did appear to have good margins and was submitted in the same container and formalin for routine pathology.   Gloves were changed. The staple line was inspected. Several small bleeding points were cauterized. One bleeding point near the lesser curvature was suture ligated with 5-0 Vicryl. The anastomosis was inspected and appeared to be widely patent. After several minutes of inspecting the staple lines it appeared that hemostasis was intact. A small amount of blood was aspirated in the area and there was no further bleeding identified.   Next, the abdomen was closed with interrupted 0 Maxon figure-of-eight suture in the fascia. The skin was closed with clips and dressings were applied with paper tape.   The patient tolerated the procedure satisfactorily. Estimated blood loss from the procedure was approximately 15 mL. The patient was prepared for transfer to the recovery room.    ____________________________ Lenna Sciara. Rochel Brome, MD jws:bjt D: 10/04/2011 10:01:03 ET T: 10/04/2011 10:29:34 ET JOB#: [id number]  cc: Loreli Dollar, MD, <Dictator> Loreli Dollar MD ELECTRONICALLY SIGNED 10/05/2011 18:30

## 2014-06-20 ENCOUNTER — Emergency Department
Admission: EM | Admit: 2014-06-20 | Discharge: 2014-06-20 | Disposition: A | Discharge status: Home / Self Care | Payer: Medicaid Other | Attending: Emergency Medicine | Admitting: Emergency Medicine

## 2014-06-20 ENCOUNTER — Emergency Department: Payer: Medicaid Other

## 2014-06-20 DIAGNOSIS — R109 Unspecified abdominal pain: Secondary | ICD-10-CM | POA: Insufficient documentation

## 2014-06-20 LAB — CBC WITH DIFFERENTIAL/PLATELET
BASOS ABS: 0.1 10*3/uL (ref 0–0.1)
Basophils Relative: 1 %
EOS ABS: 0.2 10*3/uL (ref 0–0.7)
Eosinophils Relative: 4 %
HCT: 43.9 % (ref 40.0–52.0)
Hemoglobin: 14.6 g/dL (ref 13.0–18.0)
Lymphocytes Relative: 43 %
Lymphs Abs: 2.4 10*3/uL (ref 1.0–3.6)
MCH: 32.2 pg (ref 26.0–34.0)
MCHC: 33.3 g/dL (ref 32.0–36.0)
MCV: 96.6 fL (ref 80.0–100.0)
MONO ABS: 0.5 10*3/uL (ref 0.2–1.0)
Monocytes Relative: 9 %
NEUTROS PCT: 43 %
Neutro Abs: 2.4 10*3/uL (ref 1.4–6.5)
PLATELETS: 229 10*3/uL (ref 150–440)
RBC: 4.54 MIL/uL (ref 4.40–5.90)
RDW: 14.1 % (ref 11.5–14.5)
WBC: 5.6 10*3/uL (ref 3.8–10.6)

## 2014-06-20 LAB — COMPREHENSIVE METABOLIC PANEL
ALT: 15 U/L — AB (ref 17–63)
AST: 25 U/L (ref 15–41)
Albumin: 4 g/dL (ref 3.5–5.0)
Alkaline Phosphatase: 63 U/L (ref 38–126)
Anion gap: 7 (ref 5–15)
BILIRUBIN TOTAL: 0.7 mg/dL (ref 0.3–1.2)
BUN: 18 mg/dL (ref 6–20)
CO2: 27 mmol/L (ref 22–32)
CREATININE: 1.24 mg/dL (ref 0.61–1.24)
Calcium: 8.9 mg/dL (ref 8.9–10.3)
Chloride: 104 mmol/L (ref 101–111)
GFR calc Af Amer: >60 mL/min (ref >60)
GFR calc non Af Amer: >60 mL/min (ref >60)
Glucose, Bld: 195 mg/dL — ABNORMAL HIGH (ref 65–99)
Potassium: 3.6 mmol/L (ref 3.5–5.1)
SODIUM: 138 mmol/L (ref 135–145)
Total Protein: 6.5 g/dL (ref 6.5–8.1)

## 2014-06-20 LAB — URINALYSIS COMPLETE WITH MICROSCOPIC (ARMC ONLY)
BACTERIA UA: NONE SEEN
Bilirubin Urine: NEGATIVE
Glucose, UA: 150 mg/dL — AB
Hgb urine dipstick: NEGATIVE
Ketones, ur: NEGATIVE mg/dL
LEUKOCYTES UA: NEGATIVE
Nitrite: NEGATIVE
Protein, ur: NEGATIVE mg/dL
SPECIFIC GRAVITY, URINE: 1.019 (ref 1.005–1.030)
Squamous Epithelial / LPF: NONE SEEN
pH: 5 (ref 5.0–8.0)

## 2014-06-20 LAB — LIPASE, BLOOD: Lipase: 45 U/L (ref 22–51)

## 2014-06-20 MED ORDER — TRAMADOL HCL 50 MG PO TABS: 50.0000 mg | ORAL_TABLET | Freq: Four times a day (QID) | ORAL | Status: DC | PRN

## 2014-06-20 MED ORDER — MORPHINE SULFATE 4 MG/ML IJ SOLN
INTRAMUSCULAR | Status: AC
  Administered 2014-06-20: 4 mg via INTRAVENOUS
  Filled 2014-06-20: qty 1

## 2014-06-20 MED ORDER — ONDANSETRON HCL 4 MG/2ML IJ SOLN
INTRAMUSCULAR | Status: AC
  Administered 2014-06-20: 4 mg via INTRAVENOUS
  Filled 2014-06-20: qty 2

## 2014-06-20 MED ORDER — MORPHINE SULFATE 4 MG/ML IJ SOLN
4.0000 mg | Freq: Once | INTRAMUSCULAR | Status: AC
  Administered 2014-06-20: 4 mg via INTRAVENOUS

## 2014-06-20 MED ORDER — ONDANSETRON HCL 4 MG/2ML IJ SOLN
4.0000 mg | Freq: Once | INTRAMUSCULAR | Status: AC
  Administered 2014-06-20: 4 mg via INTRAVENOUS

## 2014-06-20 MED ORDER — SODIUM CHLORIDE 0.9 % IV BOLUS (SEPSIS)
1000.0000 mL | Freq: Once | INTRAVENOUS | Status: AC
Start: 2014-06-20 — End: 2014-06-20
  Administered 2014-06-20: 1000 mL via INTRAVENOUS

## 2014-06-20 NOTE — ED Notes (Signed)
Pt c/o right flank pain x 3 days, denies any urinary symptoms or injury, also having some nausea

## 2014-06-20 NOTE — ED Provider Notes (Signed)
[facility] Emergency Department Provider Note  Time seen: 8:49 AM  I have reviewed the triage vital signs and the nursing notes.   HISTORY  Chief Complaint Flank Pain    HPI Andrew Peters is a 49 y.o. male with a past medical history of gastric cancer status post resection 2 years ago who presents the emergency department with right flank pain 2 days. According to the patient for the past 2 days he has had worsening/intermittent right flank pain. Denies any radiation of the pain. Denies dysuria, hematuria. Denies a history of kidney stones in the past. Pain is worse with some movements. Has not found anything to relieve the pain but the pain will occasionally go away on its own. Pain is currently a 10/10. Denies any fever at home. Describes the pain as sharp.   No past medical history on file.  There are no active problems to display for this patient.   No past surgical history on file.  No current outpatient prescriptions on file.  Allergies Review of patient's allergies indicates no known allergies.  No family history on file.  Social History History  Substance Use Topics  . Smoking status: Not on file  . Smokeless tobacco: Not on file  . Alcohol Use: Not on file    Review of Systems Constitutional: Negative for fever. Cardiovascular: Negative for chest pain. Respiratory: Negative for shortness of breath. Gastrointestinal: Positive for right abdominal pain. Negative for vomiting or diarrhea. Genitourinary: Negative for dysuria or hematuria. Musculoskeletal: Positive for right back pain. Skin: Negative for rash.  10-point ROS otherwise negative.  ____________________________________________   PHYSICAL EXAM:  VITAL SIGNS: ED Triage Vitals  Enc Vitals Group     BP 06/20/14 0745 125/79 mmHg     Pulse Rate 06/20/14 0745 90     Resp 06/20/14 0745 18     Temp 06/20/14 0745 97.7 F (36.5 C)     Temp Source 06/20/14 0745 Oral   SpO2 06/20/14 0745 100 %     Weight 06/20/14 0745 165 lb (74.844 kg)     Height 06/20/14 0745 5\' 7"  (1.702 m)     Head Cir --      Peak Flow --      Pain Score 06/20/14 0745 10     Pain Loc --      Pain Edu? --      Excl. in [place]? --     Constitutional: Alert and oriented. Mild distress due to pain.   Mouth/Throat: Mucous membranes are moist. Cardiovascular: Normal rate, regular rhythm. No murmurs, rubs, or gallops. Respiratory: Normal respiratory effort without tachypnea nor retractions. Breath sounds are clear and equal bilaterally. No wheezes/rales/rhonchi. Gastrointestinal: Soft and nontender. No distention. Moderate right CVA tenderness to palpation. Musculoskeletal: Nontender with normal range of motion in all extremities.  Neurologic:  Normal speech and language. No gross focal neurologic deficits are appreciated. Speech is normal. Skin:  Skin is warm, dry and intact.  Psychiatric: Mood and affect are normal. Speech and behavior are normal.   ____________________________________________   RADIOLOGY  CT scan shows punctate right renal stone otherwise normal.   INITIAL IMPRESSION / ASSESSMENT AND PLAN / ED COURSE  Pertinent labs & imaging results that were available during my care of the patient were reviewed by me and considered in my medical decision making (see chart for details).  49 year old male with 2 days of right flank pain. We'll check labs, urinalysis, CT without contrast to evaluate for kidney stone. We  will treat pain and nausea, IV hydrate, and closely monitor in the emergency department.  ----------------------------------------- 9:48 AM on 06/20/2014 -----------------------------------------  Labs show overall normal results with a mildly elevated glucose. I discussed this with the patient and the need to follow up with her primary care doctor for further evaluation. Patient agrees.  CT scan shows a punctate right kidney stone, otherwise within normal  limits. Discussed the results with the patient. We'll prescribe Ultram for pain relief and have the patient follow up with the primary care doctor. Patient is to return to the emergency department if pain continues/worsens or he develops a fever. Patient agreeable to this plan. ____________________________________________   FINAL CLINICAL IMPRESSION(S) / ED DIAGNOSES  Right abdominal pain.   Harvest Dark, MD 06/20/14 [phone]

## 2014-06-20 NOTE — Discharge Instructions (Signed)

## 2014-10-30 ENCOUNTER — Emergency Department
Admission: EM | Admit: 2014-10-30 | Discharge: 2014-10-30 | Disposition: A | Discharge status: Home / Self Care | Payer: Medicaid Other | Attending: Emergency Medicine | Admitting: Emergency Medicine

## 2014-10-30 ENCOUNTER — Encounter: Payer: Self-pay | Admitting: *Deleted

## 2014-10-30 ENCOUNTER — Emergency Department: Payer: Medicaid Other

## 2014-10-30 DIAGNOSIS — K625 Hemorrhage of anus and rectum: Secondary | ICD-10-CM

## 2014-10-30 DIAGNOSIS — Z720 Tobacco use: Secondary | ICD-10-CM | POA: Insufficient documentation

## 2014-10-30 DIAGNOSIS — R1012 Left upper quadrant pain: Secondary | ICD-10-CM | POA: Insufficient documentation

## 2014-10-30 HISTORY — DX: Malignant (primary) neoplasm, unspecified: C80.1

## 2014-10-30 LAB — CBC WITH DIFFERENTIAL/PLATELET
Basophils Absolute: 0.1 10*3/uL (ref 0–0.1)
Basophils Relative: 1 %
EOS ABS: 0.3 10*3/uL (ref 0–0.7)
EOS PCT: 4 %
HCT: 43.7 % (ref 40.0–52.0)
Hemoglobin: 15 g/dL (ref 13.0–18.0)
LYMPHS ABS: 2.1 10*3/uL (ref 1.0–3.6)
LYMPHS PCT: 35 %
MCH: 32.2 pg (ref 26.0–34.0)
MCHC: 34.3 g/dL (ref 32.0–36.0)
MCV: 94 fL (ref 80.0–100.0)
MONO ABS: 0.5 10*3/uL (ref 0.2–1.0)
MONOS PCT: 8 %
Neutro Abs: 3.2 10*3/uL (ref 1.4–6.5)
Neutrophils Relative %: 52 %
PLATELETS: 222 10*3/uL (ref 150–440)
RBC: 4.65 MIL/uL (ref 4.40–5.90)
RDW: 13.8 % (ref 11.5–14.5)
WBC: 6 10*3/uL (ref 3.8–10.6)

## 2014-10-30 LAB — URINALYSIS COMPLETE WITH MICROSCOPIC (ARMC ONLY)
Bacteria, UA: NONE SEEN
Bilirubin Urine: NEGATIVE
GLUCOSE, UA: NEGATIVE mg/dL
Hgb urine dipstick: NEGATIVE
Ketones, ur: NEGATIVE mg/dL
Leukocytes, UA: NEGATIVE
Nitrite: NEGATIVE
PROTEIN: NEGATIVE mg/dL
SPECIFIC GRAVITY, URINE: 1.019 (ref 1.005–1.030)
SQUAMOUS EPITHELIAL / LPF: NONE SEEN
pH: 6 (ref 5.0–8.0)

## 2014-10-30 LAB — COMPREHENSIVE METABOLIC PANEL
ALBUMIN: 4.3 g/dL (ref 3.5–5.0)
ALK PHOS: 70 U/L (ref 38–126)
ALT: 9 U/L — ABNORMAL LOW (ref 17–63)
AST: 21 U/L (ref 15–41)
Anion gap: 7 (ref 5–15)
BUN: 16 mg/dL (ref 6–20)
CALCIUM: 9.1 mg/dL (ref 8.9–10.3)
CO2: 27 mmol/L (ref 22–32)
CREATININE: 1.26 mg/dL — AB (ref 0.61–1.24)
Chloride: 106 mmol/L (ref 101–111)
GFR calc non Af Amer: >60 mL/min (ref >60)
GLUCOSE: 94 mg/dL (ref 65–99)
Potassium: 3.7 mmol/L (ref 3.5–5.1)
Sodium: 140 mmol/L (ref 135–145)
Total Bilirubin: 0.1 mg/dL — ABNORMAL LOW (ref 0.3–1.2)
Total Protein: 6.7 g/dL (ref 6.5–8.1)

## 2014-10-30 LAB — LIPASE, BLOOD: Lipase: 46 U/L (ref 22–51)

## 2014-10-30 MED ORDER — ONDANSETRON HCL 4 MG/2ML IJ SOLN
4.0000 mg | Freq: Once | INTRAMUSCULAR | Status: AC
  Administered 2014-10-30: 4 mg via INTRAVENOUS
  Filled 2014-10-30: qty 2

## 2014-10-30 MED ORDER — IOHEXOL 300 MG/ML  SOLN
100.0000 mL | Freq: Once | INTRAMUSCULAR | Status: AC | PRN
  Administered 2014-10-30: 100 mL via INTRAVENOUS

## 2014-10-30 MED ORDER — IOHEXOL 240 MG/ML SOLN
25.0000 mL | Freq: Once | INTRAMUSCULAR | Status: AC | PRN
  Administered 2014-10-30: 25 mL via ORAL

## 2014-10-30 MED ORDER — SODIUM CHLORIDE 0.9 % IV SOLN
Freq: Once | INTRAVENOUS | Status: AC
  Administered 2014-10-30: 22:00:00 via INTRAVENOUS

## 2014-10-30 MED ORDER — MORPHINE SULFATE (PF) 4 MG/ML IV SOLN
4.0000 mg | Freq: Once | INTRAVENOUS | Status: AC
  Administered 2014-10-30: 4 mg via INTRAVENOUS
  Filled 2014-10-30: qty 1

## 2014-10-30 NOTE — ED Notes (Signed)
Pt ambulating to restroom. No difficulty.

## 2014-10-30 NOTE — ED Provider Notes (Signed)
[facility] Emergency Department Provider Note  ____________________________________________  Time seen: Approximately 9:35 PM  I have reviewed the triage vital signs and the nursing notes.   HISTORY  Chief Complaint Rectal Bleeding    HPI Andrew Peters is a 49 y.o. male patient reports he went to the bathroom earlier today and had a lot of red blood in the toilet and on the toilet tissue. He's never had that before. Patient reports that he began having pain in the left upper quadrant. Pain is fairly severe. Sharp in nature especially with deep breathing. Patient is not nauseated. Patient reports he had half of his stomach taken out for stomach cancer and also had a cyst on his kidney removed years ago.   Past Medical History  Diagnosis Date  . Cancer     Stomach    There are no active problems to display for this patient.   Past Surgical History  Procedure Laterality Date  . Stomach surgery      Removal of half of stomach due to cancer 2013 0r 2014.    Current Outpatient Rx  Name  Route  Sig  Dispense  Refill  . traMADol (ULTRAM) 50 MG tablet   Oral   Take 1 tablet (50 mg total) by mouth every 6 (six) hours as needed.   20 tablet   0     Allergies Review of patient's allergies indicates no known allergies.  No family history on file.  Social History Social History  Substance Use Topics  . Smoking status: Current Some Day Smoker  . Smokeless tobacco: None  . Alcohol Use: Yes     Comment: 3/4 x week    Review of Systems Constitutional: No fever/chills Eyes: No visual changes. ENT: No sore throat. Cardiovascular: Denies chest pain. Respiratory: Denies shortness of breath. Gastrointestinal: See history of present illness  No nausea, no vomiting.  No diarrhea.  No constipation. Genitourinary: Negative for dysuria. Musculoskeletal: Negative for back pain. Skin: Negative for rash. Neurological: Negative for headaches, focal  weakness or numbness.  10-point ROS otherwise negative.  ____________________________________________   PHYSICAL EXAM:  VITAL SIGNS: ED Triage Vitals  Enc Vitals Group     BP 10/30/14 1859 132/97 mmHg     Pulse Rate 10/30/14 1859 78     Resp 10/30/14 1859 18     Temp 10/30/14 1859 97.3 F (36.3 C)     Temp Source 10/30/14 1859 Oral     SpO2 10/30/14 1859 100 %     Weight 10/30/14 1859 163 lb (73.936 kg)     Height 10/30/14 1859 5\' 7"  (1.702 m)     Head Cir --      Peak Flow --      Pain Score 10/30/14 1900 9     Pain Loc --      Pain Edu? --      Excl. in [place]? --     Constitutional: Alert and oriented. Well appearing and in no acute distress. Eyes: Conjunctivae are normal. PERRL. EOMI. Head: Atraumatic. Nose: No congestion/rhinnorhea. Mouth/Throat: Mucous membranes are moist.  Oropharynx non-erythematous. Neck: No stridor. Cardiovascular: Normal rate, regular rhythm. Grossly normal heart sounds.  Good peripheral circulation. Respiratory: Normal respiratory effort.  No retractions. Lungs CTAB. Gastrointestinal: Soft patient tender to palpation percussion from the epigastrium to left upper quadrant and down left side of the abdomen to left lower quadrant worst pain is in the left upper quadrant. No distention. No abdominal bruits. No CVA tenderness.  Rectal exam: There is a small amount of hemorrhoidal tags around the rectum. There are no palpable internal hemorrhoids. There is no stool in the vault my finger comes out clean. There is no tenderness on rectal exam Musculoskeletal: No lower extremity tenderness nor edema.  No joint effusions. Neurologic:  Normal speech and language. No gross focal neurologic deficits are appreciated. No gait instability. Skin:  Skin is warm, dry and intact. No rash noted. Psychiatric: Mood and affect are normal. Speech and behavior are normal.  ____________________________________________   LABS (all labs ordered are listed, but only abnormal  results are displayed)  Labs Reviewed  COMPREHENSIVE METABOLIC PANEL - Abnormal; Notable for the following:    Creatinine, Ser 1.26 (*)    ALT 9 (*)    Total Bilirubin 0.1 (*)    All other components within normal limits  URINALYSIS COMPLETEWITH MICROSCOPIC ([facility] ONLY) - Abnormal; Notable for the following:    Color, Urine COLORLESS (*)    APPearance CLEAR (*)    All other components within normal limits  LIPASE, BLOOD  CBC WITH DIFFERENTIAL/PLATELET   ____________________________________________  EKG  EKG read and interpreted by me shows normal sinus rhythm a rate of 73 normal axis possible LVH otherwise normal. ____________________________________________  RADIOLOGY  CT shows some stool in the colon otherwise no acute findings no sign of cancer ____________________________________________   PROCEDURES   ____________________________________________   INITIAL IMPRESSION / ASSESSMENT AND PLAN / ED COURSE  Pertinent labs & imaging results that were available during my care of the patient were reviewed by me and considered in my medical decision making (see chart for details). . Patient is not lightheaded patient has a stable H&H patient's CT is normal although is nothing on my finger on rectal exam discussed a shouldn't in depth with Dr. Jackelyn Poling [facility]. Feel he is stable to discharge we'll have him follow-up with W Dr. Allen Norris the gastroenterologist next week.  ____________________________________________   FINAL CLINICAL IMPRESSION(S) / ED DIAGNOSES  Final diagnoses:  Rectal bleeding      Nena Polio, MD 10/30/14 [phone]

## 2014-10-30 NOTE — ED Notes (Addendum)
Patient noticed bright red blood in toilet and on tissue today after a bowel movement. Patient has stomach cancer and had 1/2 of stomach removed, but treatment does not include chemo or radiation. Patient also states he feels dizzy.

## 2014-10-30 NOTE — ED Notes (Signed)
Unable to obtain urine specimen at this point

## 2014-10-30 NOTE — ED Notes (Signed)
Patient transported to CT 

## 2014-10-30 NOTE — ED Notes (Signed)
Patient unable to void at this moment.

## 2014-10-30 NOTE — Discharge Instructions (Signed)

## 2014-10-30 NOTE — ED Notes (Signed)
Pt reports feeling weak and lightheaded since this afternoon. Pt reports having a bowle movement that he did not have to strain during. Pt wiped three times and reports seeing bright red blood on toilet paper all three times. Pt reports sudden onset of sharp constant left sided abd after having bloo in stool. Pt denies NVD and changes in urination. Pt denies having hemrroids in the past.

## 2014-10-31 ENCOUNTER — Emergency Department
Admission: EM | Admit: 2014-10-31 | Discharge: 2014-10-31 | Disposition: A | Discharge status: Home / Self Care | Payer: Medicaid Other | Attending: Emergency Medicine | Admitting: Emergency Medicine

## 2014-10-31 ENCOUNTER — Emergency Department: Payer: Medicaid Other

## 2014-10-31 DIAGNOSIS — Z79899 Other long term (current) drug therapy: Secondary | ICD-10-CM | POA: Insufficient documentation

## 2014-10-31 DIAGNOSIS — R1012 Left upper quadrant pain: Secondary | ICD-10-CM | POA: Insufficient documentation

## 2014-10-31 DIAGNOSIS — Z720 Tobacco use: Secondary | ICD-10-CM | POA: Insufficient documentation

## 2014-10-31 DIAGNOSIS — R109 Unspecified abdominal pain: Secondary | ICD-10-CM

## 2014-10-31 LAB — CBC
HEMATOCRIT: 43.5 % (ref 40.0–52.0)
HEMOGLOBIN: 14.7 g/dL (ref 13.0–18.0)
MCH: 32 pg (ref 26.0–34.0)
MCHC: 33.7 g/dL (ref 32.0–36.0)
MCV: 94.9 fL (ref 80.0–100.0)
Platelets: 207 10*3/uL (ref 150–440)
RBC: 4.59 MIL/uL (ref 4.40–5.90)
RDW: 13.8 % (ref 11.5–14.5)
WBC: 6.4 10*3/uL (ref 3.8–10.6)

## 2014-10-31 LAB — COMPREHENSIVE METABOLIC PANEL
ALT: 15 U/L — ABNORMAL LOW (ref 17–63)
ANION GAP: 9 (ref 5–15)
AST: 24 U/L (ref 15–41)
Albumin: 4.1 g/dL (ref 3.5–5.0)
Alkaline Phosphatase: 63 U/L (ref 38–126)
BILIRUBIN TOTAL: 0.5 mg/dL (ref 0.3–1.2)
BUN: 14 mg/dL (ref 6–20)
CO2: 23 mmol/L (ref 22–32)
Calcium: 8.7 mg/dL — ABNORMAL LOW (ref 8.9–10.3)
Chloride: 102 mmol/L (ref 101–111)
Creatinine, Ser: 1.12 mg/dL (ref 0.61–1.24)
Glucose, Bld: 89 mg/dL (ref 65–99)
Potassium: 3.3 mmol/L — ABNORMAL LOW (ref 3.5–5.1)
Sodium: 134 mmol/L — ABNORMAL LOW (ref 135–145)
TOTAL PROTEIN: 6.4 g/dL — AB (ref 6.5–8.1)

## 2014-10-31 LAB — URINALYSIS COMPLETE WITH MICROSCOPIC (ARMC ONLY)
BACTERIA UA: NONE SEEN
Bilirubin Urine: NEGATIVE
GLUCOSE, UA: NEGATIVE mg/dL
HGB URINE DIPSTICK: NEGATIVE
Ketones, ur: NEGATIVE mg/dL
LEUKOCYTES UA: NEGATIVE
NITRITE: NEGATIVE
Protein, ur: NEGATIVE mg/dL
SPECIFIC GRAVITY, URINE: 1.01 (ref 1.005–1.030)
pH: 6 (ref 5.0–8.0)

## 2014-10-31 LAB — LIPASE, BLOOD: Lipase: 43 U/L (ref 22–51)

## 2014-10-31 MED ORDER — DICYCLOMINE HCL 20 MG PO TABS: 20.0000 mg | ORAL_TABLET | Freq: Three times a day (TID) | ORAL | Status: DC | PRN

## 2014-10-31 MED ORDER — SUCRALFATE 1 G PO TABS: 1.0000 g | ORAL_TABLET | Freq: Four times a day (QID) | ORAL | Status: DC

## 2014-10-31 MED ORDER — DICYCLOMINE HCL 10 MG/ML IM SOLN
20.0000 mg | Freq: Once | INTRAMUSCULAR | Status: AC
  Administered 2014-10-31: 20 mg via INTRAMUSCULAR
  Filled 2014-10-31: qty 2

## 2014-10-31 MED ORDER — GI COCKTAIL ~~LOC~~
30.0000 mL | Freq: Once | ORAL | Status: AC
  Administered 2014-10-31: 30 mL via ORAL
  Filled 2014-10-31: qty 30

## 2014-10-31 MED ORDER — PANTOPRAZOLE SODIUM 40 MG PO TBEC: 40.0000 mg | DELAYED_RELEASE_TABLET | Freq: Every day | ORAL | Status: DC

## 2014-10-31 NOTE — ED Provider Notes (Signed)
[facility] Emergency Department Provider Note   ____________________________________________  Time seen: 1730  I have reviewed the triage vital signs and the nursing notes.   HISTORY  Chief Complaint Abdominal Pain   History limited by: Not Limited   HPI Andrew Peters is a 49 y.o. male who presents to the emergency department today with concerns for continued abdominal pain. Patient states that the abdominal pain started 2 days ago. The pain has been constant. It is severe. The patient was seen in the emergency department yesterday for bloody stool. He denies any bloody stool today. He underwent a CT scan yesterday which did not show any acute pathology. Patient denies any fevers.    Past Medical History  Diagnosis Date  . Cancer     Stomach    There are no active problems to display for this patient.   Past Surgical History  Procedure Laterality Date  . Stomach surgery      Removal of half of stomach due to cancer 2013 0r 2014.    Current Outpatient Rx  Name  Route  Sig  Dispense  Refill  . dicyclomine (BENTYL) 20 MG tablet   Oral   Take 1 tablet (20 mg total) by mouth 3 (three) times daily as needed for spasms.   30 tablet   0   . pantoprazole (PROTONIX) 40 MG tablet   Oral   Take 1 tablet (40 mg total) by mouth daily.   30 tablet   1   . sucralfate (CARAFATE) 1 G tablet   Oral   Take 1 tablet (1 g total) by mouth 4 (four) times daily.   120 tablet   1   . traMADol (ULTRAM) 50 MG tablet   Oral   Take 1 tablet (50 mg total) by mouth every 6 (six) hours as needed.   20 tablet   0     Allergies Review of patient's allergies indicates no known allergies.  No family history on file.  Social History Social History  Substance Use Topics  . Smoking status: Current Some Day Smoker  . Smokeless tobacco: None  . Alcohol Use: Yes     Comment: 3/4 x week    Review of Systems  Constitutional: Negative for  fever. Cardiovascular: Negative for chest pain. Respiratory: Negative for shortness of breath. Gastrointestinal: Positive for abdominal pain Genitourinary: Negative for dysuria. Musculoskeletal: Negative for back pain. Skin: Negative for rash.   10-point ROS otherwise negative.  ____________________________________________   PHYSICAL EXAM:  VITAL SIGNS: ED Triage Vitals  Enc Vitals Group     BP 10/31/14 1657 137/104 mmHg     Pulse Rate 10/31/14 1657 60     Resp 10/31/14 1657 15     Temp 10/31/14 1657 98.1 F (36.7 C)     Temp Source 10/31/14 1657 Oral     SpO2 10/31/14 1653 100 %     Weight --      Height --      Head Cir --      Peak Flow --      Pain Score 10/31/14 1658 7   Constitutional: Alert and oriented. Well appearing and in no distress. Eyes: Conjunctivae are normal. PERRL. Normal extraocular movements. ENT   Head: Normocephalic and atraumatic.   Nose: No congestion/rhinnorhea.   Mouth/Throat: Mucous membranes are moist.   Neck: No stridor. Hematological/Lymphatic/Immunilogical: No cervical lymphadenopathy. Cardiovascular: Normal rate, regular rhythm.  No murmurs, rubs, or gallops. Respiratory: Normal respiratory effort without tachypnea nor  retractions. Breath sounds are clear and equal bilaterally. No wheezes/rales/rhonchi. Gastrointestinal: Soft and mildly tender to palpation of the mid and left upper quadrant Genitourinary: Deferred Musculoskeletal: Normal range of motion in all extremities. No joint effusions.  No lower extremity tenderness nor edema. Neurologic:  Normal speech and language. No gross focal neurologic deficits are appreciated. Speech is normal.  Skin:  Skin is warm, dry and intact. No rash noted. Psychiatric: Mood and affect are normal. Speech and behavior are normal. Patient exhibits appropriate insight and judgment.  ____________________________________________    LABS (pertinent positives/negatives)  Labs Reviewed   COMPREHENSIVE METABOLIC PANEL - Abnormal; Notable for the following:    Sodium 134 (*)    Potassium 3.3 (*)    Calcium 8.7 (*)    Total Protein 6.4 (*)    ALT 15 (*)    All other components within normal limits  URINALYSIS COMPLETEWITH MICROSCOPIC ([facility] ONLY) - Abnormal; Notable for the following:    Color, Urine YELLOW (*)    APPearance CLEAR (*)    Squamous Epithelial / LPF 0-5 (*)    All other components within normal limits  LIPASE, BLOOD  CBC     ____________________________________________   EKG  None  ____________________________________________    RADIOLOGY  Three-way abdomen IMPRESSION: No acute abnormality.  ____________________________________________   PROCEDURES  Procedure(s) performed: None  Critical Care performed: No  ____________________________________________   INITIAL IMPRESSION / ASSESSMENT AND PLAN / ED COURSE  Pertinent labs & imaging results that were available during my care of the patient were reviewed by me and considered in my medical decision making (see chart for details).  Patient presents to the emergency department today with continued abdominal pain. He did have some bloody stool yesterday was seen in the emergency department underwent a CT scan which did not show any obvious etiology of his symptoms. Given that recent CT scan I did not repeat that advanced imaging however did get a three-way abdomen which did not show any concerning findings. Additionally his blood work today without any obvious etiology of the pain. I gave the patient a GI cocktail which she stated did help his pain. I do wonder if patient has a component of gastritis contributing to his pain. Will plan on discharging with prescription for gastritis as well as Bentyl. Discussed with patient that he should follow-up with GI.  ____________________________________________   FINAL CLINICAL IMPRESSION(S) / ED DIAGNOSES  Final diagnoses:  Abdominal pain,  unspecified abdominal location     Nance Pear, MD 10/31/14 2107

## 2014-10-31 NOTE — Discharge Instructions (Signed)
Please seek medical attention for any high fevers, chest pain, shortness of breath, change in behavior, persistent vomiting, bloody stool or any other new or concerning symptoms. ° °Abdominal Pain °Many things can cause abdominal pain. Usually, abdominal pain is not caused by a disease and will improve without treatment. It can often be observed and treated at home. Your health care provider will do a physical exam and possibly order blood tests and X-rays to help determine the seriousness of your pain. However, in many cases, more time must pass before a clear cause of the pain can be found. Before that point, your health care provider may not know if you need more testing or further treatment. °HOME CARE INSTRUCTIONS  °Monitor your abdominal pain for any changes. The following actions may help to alleviate any discomfort you are experiencing: °· Only take over-the-counter or prescription medicines as directed by your health care provider. °· Do not take laxatives unless directed to do so by your health care provider. °· Try a clear liquid diet (broth, tea, or water) as directed by your health care provider. Slowly move to a bland diet as tolerated. °SEEK MEDICAL CARE IF: °· You have unexplained abdominal pain. °· You have abdominal pain associated with nausea or diarrhea. °· You have pain when you urinate or have a bowel movement. °· You experience abdominal pain that wakes you in the night. °· You have abdominal pain that is worsened or improved by eating food. °· You have abdominal pain that is worsened with eating fatty foods. °· You have a fever. °SEEK IMMEDIATE MEDICAL CARE IF:  °· Your pain does not go away within 2 hours. °· You keep throwing up (vomiting). °· Your pain is felt only in portions of the abdomen, such as the right side or the left lower portion of the abdomen. °· You pass bloody or black tarry stools. °MAKE SURE YOU: °· Understand these instructions.   °· Will watch your condition.   °· Will  get help right away if you are not doing well or get worse.   °Document Released: 11/03/2004 Document Revised: 01/29/2013 Document Reviewed: 10/03/2012 °Exit[employer]® Patient Information ©2015 [employer]. This information is not intended to replace advice given to you by your health care provider. Make sure you discuss any questions you have with your health care provider. ° °

## 2014-10-31 NOTE — ED Notes (Signed)
Pt brought to ED via EMS w/ c/o ab pain.  Per EMS, pt was here yesterday for dizziness and blood in stool and was found to have "backed up stool".  Pt sts he was fine at home until 1500 9/23.  Pt sts he used restroom and began to have ab pain in RLQ and mid abd.  Per EMS, pt received 100 mg Fentyl en route.

## 2014-11-03 ENCOUNTER — Telehealth: Payer: Self-pay | Admitting: *Deleted

## 2014-11-03 NOTE — Telephone Encounter (Signed)
Appt tomorrow at 10:45 agreed on by pt

## 2014-11-03 NOTE — Telephone Encounter (Signed)
  I looked at old [place] notes.  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  My last note said he needed a PCP.  He must have a gastroenterologist who scoped him in the past.    He definitely needs to go to ER and call his GI doctor.  M

## 2014-11-03 NOTE — Telephone Encounter (Signed)
called to report that he went to ER Thursday and Friday with bloody stools and again this morning he is passing blood in his stools. Asking to be seen today. I spoke with Dr Grayland Ormond who said to schedule him to see Dr Mike Gip his oncologist tomorrow or to go back to ER if he does not want to wait to be seen. I have attempted several times to call patient back, but I get VM. I left  A message for him to call me back

## 2014-11-04 ENCOUNTER — Inpatient Hospital Stay: Payer: Medicaid Other | Attending: Hematology and Oncology | Admitting: Hematology and Oncology

## 2014-11-04 ENCOUNTER — Encounter: Payer: Self-pay | Admitting: Hematology and Oncology

## 2014-11-04 VITALS — BP 125/85 | HR 81 | Temp 98.0°F | Wt 159.1 lb

## 2014-11-04 DIAGNOSIS — C169 Malignant neoplasm of stomach, unspecified: Secondary | ICD-10-CM

## 2014-11-04 DIAGNOSIS — R1012 Left upper quadrant pain: Secondary | ICD-10-CM | POA: Insufficient documentation

## 2014-11-04 DIAGNOSIS — K625 Hemorrhage of anus and rectum: Secondary | ICD-10-CM

## 2014-11-04 DIAGNOSIS — N400 Enlarged prostate without lower urinary tract symptoms: Secondary | ICD-10-CM | POA: Diagnosis not present

## 2014-11-04 DIAGNOSIS — F17210 Nicotine dependence, cigarettes, uncomplicated: Secondary | ICD-10-CM

## 2014-11-04 DIAGNOSIS — C163 Malignant neoplasm of pyloric antrum: Secondary | ICD-10-CM | POA: Insufficient documentation

## 2014-11-04 DIAGNOSIS — K921 Melena: Secondary | ICD-10-CM

## 2014-11-04 DIAGNOSIS — D1809 Hemangioma of other sites: Secondary | ICD-10-CM | POA: Insufficient documentation

## 2014-11-04 DIAGNOSIS — R55 Syncope and collapse: Secondary | ICD-10-CM | POA: Diagnosis not present

## 2014-11-04 DIAGNOSIS — K7689 Other specified diseases of liver: Secondary | ICD-10-CM | POA: Diagnosis not present

## 2014-11-04 NOTE — Progress Notes (Addendum)
[facility] day:  11/04/2014  Chief Complaint: Andrew Peters is a 49 y.o. male with stage IB gastric cancer who is seen for assessment secondary to abdominal pain and hematochezia.  HPI: The patient was last seen in the medical oncology clinic on 05/20/2014.  At that time, he was seen for initial assessment by me.  Symptomatically, he was doing well.  Exam was unremarkable.  Labs included a normal CBC with diff and CMP.  CEA was 5.2 with a prior value of 5.6 on 11/19/2013.  During the interim,  The patient was seen in the emergency room at [facility] with severe abdominal pain.  He also described blood stool.  CBC with diff, CMP, and lipase were normal.  Abdomen and pelvic CT scan with contrast on 10/30/2014 revealed no evidence of bowel obstruction or inflammation.  There were multiple stable liver cysts and hemangiomas.  There was no evidence of residual tumor.  There was prostate enlargement with bladder wall thickening.  He describes a tissue full of blood (greater than a tablespoon) last Thursday after wiping.  He felt presyncopal.  The following day he experienced LUQ abdominal pain.  Pain has improved.  He was told that he was constipated.  He is eating well.  He denies any pain after eating.  Past Medical History  Diagnosis Date  . Cancer [facility])     Stomach    Past Surgical History  Procedure Laterality Date  . Stomach surgery      Removal of half of stomach due to cancer 2013 0r 2014.  . Colonoscopy with propofol N/A 11/13/2014    Procedure: COLONOSCOPY WITH PROPOFOL;  Surgeon: Hulen Luster, MD;  Location: [facility] ENDOSCOPY;  Service: Gastroenterology;  Laterality: N/A;  . Esophagogastroduodenoscopy (egd) with propofol N/A 11/13/2014    Procedure: ESOPHAGOGASTRODUODENOSCOPY (EGD) WITH PROPOFOL;  Surgeon: Hulen Luster, MD;  Location[facility] ENDOSCOPY;  Service: Gastroenterology;  Laterality: N/A;    History reviewed. No pertinent family  history.  Social History:  reports that he has been smoking Cigarettes.  He has been smoking about 0.25 packs per day. He does not have any smokeless tobacco history on file. He reports that he drinks alcohol. He reports that he does not use illicit drugs.  He smokes 5 cigarettes a week. The patient is alone today.  Allergies: No Known Allergies  Current Medications: No current outpatient prescriptions on file.   No current facility-administered medications for this visit.   Review of Systems:  GENERAL:  Feels "ok".  No fevers, sweats or weight loss. PERFORMANCE STATUS (ECOG):  1 HEENT:  No visual changes, runny nose, sore throat, mouth sores or tenderness. Lungs: No shortness of breath or cough.  No hemoptysis. Cardiac:  No chest pain, palpitations, orthopnea, or PND. GI:  Abdominal pain (see HPI).  Constipation.  Gastritis.  No nausea, vomiting, diarrhea, or melena.  Hematochezia. GU:  No urgency, frequency, dysuria, or hematuria. Musculoskeletal:  No back pain.  No joint pain.  No muscle tenderness. Extremities:  No pain or swelling. Skin:  No rashes or skin changes. Neuro:  Sometimes dizzy/light headed.  No headache, numbness or weakness, balance or coordination issues. Endocrine:  No diabetes, thyroid issues, hot flashes or night sweats. Psych:  No mood changes, depression or anxiety. Pain:  No focal pain. Review of systems:  All other systems reviewed and found to be negative.  Physical Exam: Blood pressure 125/85, pulse 81, temperature 98 F (36.7 C), temperature source  Tympanic, weight 159 lb 1 oz (72.15 kg). GENERAL:  Well developed, well nourished, sitting comfortably in the exam room in no acute distress. MENTAL STATUS:  Alert and oriented to person, place and time. HEAD:  Short gray hair.  Mustache.  Normocephalic, atraumatic, face symmetric, no Cushingoid features. EYES:  Brown eyes.  Pupils equal round and reactive to light and accomodation.  No conjunctivitis or  scleral icterus. ENT:  Oropharynx clear without lesion.  Tongue normal. Mucous membranes moist.  RESPIRATORY:  Clear to auscultation without rales, wheezes or rhonchi. CARDIOVASCULAR:  Regular rate and rhythm without murmur, rub or gallop. ABDOMEN:  Soft, slightly tender LUQ.  No guarding or rebound tenderness.  Active bowel sounds, and no hepatosplenomegaly.  No masses. RECTAL:  Two large hemorrhoids.  No active bleeding. SKIN:  No rashes, ulcers or lesions. EXTREMITIES: No edema, no skin discoloration or tenderness.  No palpable cords. LYMPH NODES: No palpable cervical, supraclavicular, axillary or inguinal adenopathy  NEUROLOGICAL: Unremarkable. PSYCH:  Appropriate.  No visits with results within 3 Day(s) from this visit. Latest known visit with results is:  Admission on 10/31/2014, Discharged on 10/31/2014  Component Date Value Ref Range Status  . Lipase 10/31/2014 43  22 - 51 U/L Final  . Sodium 10/31/2014 134* 135 - 145 mmol/L Final  . Potassium 10/31/2014 3.3* 3.5 - 5.1 mmol/L Final  . Chloride 10/31/2014 102  101 - 111 mmol/L Final  . CO2 10/31/2014 23  22 - 32 mmol/L Final  . Glucose, Bld 10/31/2014 89  65 - 99 mg/dL Final  . BUN 10/31/2014 14  6 - 20 mg/dL Final  . Creatinine, Ser 10/31/2014 1.12  0.61 - 1.24 mg/dL Final  . Calcium 10/31/2014 8.7* 8.9 - 10.3 mg/dL Final  . Total Protein 10/31/2014 6.4* 6.5 - 8.1 g/dL Final  . Albumin 10/31/2014 4.1  3.5 - 5.0 g/dL Final  . AST 10/31/2014 24  15 - 41 U/L Final  . ALT 10/31/2014 15* 17 - 63 U/L Final  . Alkaline Phosphatase 10/31/2014 63  38 - 126 U/L Final  . Total Bilirubin 10/31/2014 0.5  0.3 - 1.2 mg/dL Final  . GFR calc non Af Amer 10/31/2014 >60  >60 mL/min Final  . GFR calc Af Amer 10/31/2014 >60  >60 mL/min Final   Comment: (NOTE) The eGFR has been calculated using the CKD EPI equation. This calculation has not been validated in all clinical situations. eGFR's persistently <60 mL/min signify possible Chronic  Kidney Disease.   . Anion gap 10/31/2014 9  5 - 15 Final  . WBC 10/31/2014 6.4  3.8 - 10.6 K/uL Final  . RBC 10/31/2014 4.59  4.40 - 5.90 MIL/uL Final  . Hemoglobin 10/31/2014 14.7  13.0 - 18.0 g/dL Final  . HCT 10/31/2014 43.5  40.0 - 52.0 % Final  . MCV 10/31/2014 94.9  80.0 - 100.0 fL Final  . MCH 10/31/2014 32.0  26.0 - 34.0 pg Final  . MCHC 10/31/2014 33.7  32.0 - 36.0 g/dL Final  . RDW 10/31/2014 13.8  11.5 - 14.5 % Final  . Platelets 10/31/2014 207  150 - 440 K/uL Final  . Color, Urine 10/31/2014 YELLOW* YELLOW Final  . APPearance 10/31/2014 CLEAR* CLEAR Final  . Glucose, UA 10/31/2014 NEGATIVE  NEGATIVE mg/dL Final  . Bilirubin Urine 10/31/2014 NEGATIVE  NEGATIVE Final  . Ketones, ur 10/31/2014 NEGATIVE  NEGATIVE mg/dL Final  . Specific Gravity, Urine 10/31/2014 1.010  1.005 - 1.030 Final  . Hgb urine dipstick 10/31/2014 NEGATIVE  NEGATIVE Final  . pH 10/31/2014 6.0  5.0 - 8.0 Final  . Protein, ur 10/31/2014 NEGATIVE  NEGATIVE mg/dL Final  . Nitrite 10/31/2014 NEGATIVE  NEGATIVE Final  . Leukocytes, UA 10/31/2014 NEGATIVE  NEGATIVE Final  . RBC / HPF 10/31/2014 0-5  0 - 5 RBC/hpf Final  . WBC, UA 10/31/2014 0-5  0 - 5 WBC/hpf Final  . Bacteria, UA 10/31/2014 NONE SEEN  NONE SEEN Final  . Squamous Epithelial / LPF 10/31/2014 0-5* NONE SEEN Final  . Mucous 10/31/2014 PRESENT   Final    Assessment:  Andrew Peters is a 49 y.o. male old African American gentleman with stage IB gastric carcinoma.  He presented in 08/2011 with an 8 month history of epigastric pain, early satiety, anorexia, and hematemesis.  Upper endoscopy on 08/19/2011 revealed a large gastric antral ulcer.  Biopsies demonstrated chronic active gastritis with Helicobacter pylori as well as intramucosal adenocarcinoma.  PET scan did not reveal any disease outside of the stomach.    He underwent partial distal gastrectomy on 10/04/2011.  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  Margins were negative.  Nine lymph nodes were negative.  Pathologic stage was T2N0M0.  CEA has ranged between 4 and 5.  Upper endoscopy on 12/03/2013 was negative.    Abdomen and pelvic CT scan with contrast on 10/30/2014 revealed no evidence of bowel obstruction or inflammation.  There were multiple stable liver cysts and hemangiomas.  There was no evidence of residual tumor.  There was prostate enlargement with bladder wall thickening.  Symptomatically, he has had recent hematochezia and LUQ abdominal pain.  Exam reveals a slightly tender LUQ without guarding or rebound tenderness.  Plan: 1. Review labs and scans from ER evaluation. 2. Patient to contact Dr. Myrna Blazer office this week for follow-up endoscopy. 3. RTC as previously scheduled.    Lequita Asal, MD  11/04/2014, 11:18 AM

## 2014-11-13 ENCOUNTER — Encounter
Admission: RE | Disposition: A | Discharge status: Home / Self Care | Payer: Self-pay | Source: Ambulatory Visit | Attending: Gastroenterology

## 2014-11-13 ENCOUNTER — Ambulatory Visit: Payer: Medicaid Other | Admitting: Anesthesiology

## 2014-11-13 ENCOUNTER — Ambulatory Visit
Admission: RE | Admit: 2014-11-13 | Discharge: 2014-11-13 | Disposition: A | Discharge status: Home / Self Care | Payer: Medicaid Other | Source: Ambulatory Visit | Attending: Gastroenterology | Admitting: Gastroenterology

## 2014-11-13 DIAGNOSIS — Z85028 Personal history of other malignant neoplasm of stomach: Secondary | ICD-10-CM | POA: Insufficient documentation

## 2014-11-13 DIAGNOSIS — F17210 Nicotine dependence, cigarettes, uncomplicated: Secondary | ICD-10-CM | POA: Diagnosis not present

## 2014-11-13 DIAGNOSIS — Z903 Acquired absence of stomach [part of]: Secondary | ICD-10-CM | POA: Diagnosis not present

## 2014-11-13 DIAGNOSIS — K2950 Unspecified chronic gastritis without bleeding: Secondary | ICD-10-CM | POA: Diagnosis not present

## 2014-11-13 DIAGNOSIS — Z791 Long term (current) use of non-steroidal anti-inflammatories (NSAID): Secondary | ICD-10-CM | POA: Diagnosis not present

## 2014-11-13 DIAGNOSIS — K259 Gastric ulcer, unspecified as acute or chronic, without hemorrhage or perforation: Secondary | ICD-10-CM | POA: Diagnosis not present

## 2014-11-13 DIAGNOSIS — K921 Melena: Secondary | ICD-10-CM | POA: Insufficient documentation

## 2014-11-13 DIAGNOSIS — D124 Benign neoplasm of descending colon: Secondary | ICD-10-CM | POA: Insufficient documentation

## 2014-11-13 DIAGNOSIS — R1012 Left upper quadrant pain: Secondary | ICD-10-CM | POA: Insufficient documentation

## 2014-11-13 HISTORY — PX: ESOPHAGOGASTRODUODENOSCOPY (EGD) WITH PROPOFOL: SHX5813

## 2014-11-13 HISTORY — PX: COLONOSCOPY WITH PROPOFOL: SHX5780

## 2014-11-13 SURGERY — COLONOSCOPY WITH PROPOFOL
Anesthesia: General

## 2014-11-13 MED ORDER — PROPOFOL 500 MG/50ML IV EMUL
INTRAVENOUS | Status: DC | PRN
  Administered 2014-11-13: 140 ug/kg/min via INTRAVENOUS

## 2014-11-13 MED ORDER — GLYCOPYRROLATE 0.2 MG/ML IJ SOLN
INTRAMUSCULAR | Status: DC | PRN
  Administered 2014-11-13: 0.2 mg via INTRAVENOUS

## 2014-11-13 MED ORDER — SODIUM CHLORIDE 0.9 % IV SOLN
INTRAVENOUS | Status: DC
  Administered 2014-11-13: 10:00:00 via INTRAVENOUS
  Administered 2014-11-13: 1000 mL via INTRAVENOUS

## 2014-11-13 MED ORDER — MIDAZOLAM HCL 2 MG/2ML IJ SOLN
INTRAMUSCULAR | Status: DC | PRN
  Administered 2014-11-13: 1 mg via INTRAVENOUS

## 2014-11-13 MED ORDER — PROPOFOL 10 MG/ML IV BOLUS
INTRAVENOUS | Status: DC | PRN
  Administered 2014-11-13: 50 mg via INTRAVENOUS

## 2014-11-13 MED ORDER — LIDOCAINE HCL (CARDIAC) 20 MG/ML IV SOLN
INTRAVENOUS | Status: DC | PRN
  Administered 2014-11-13: 60 mg via INTRAVENOUS

## 2014-11-13 MED ORDER — SODIUM CHLORIDE 0.9 % IV SOLN: INTRAVENOUS | Status: DC

## 2014-11-13 NOTE — Op Note (Signed)
[facility] Gastroenterology Patient Name: Andrew Peters Procedure Date: 11/13/2014 10:33 AM MRN: [id number] Account #: [id number] Date of Birth: March 27, 1965 Admit Type: Outpatient Age: 49 Room: [facility] ENDO ROOM 4 Gender: Male Note Status: Finalized Procedure:         Colonoscopy Indications:       Rectal bleeding Providers:         Lupita Dawn. Candace Cruise, MD Referring MD:      Lupita Dawn. Candace Cruise, MD (Referring MD) Medicines:         Monitored Anesthesia Care Complications:     No immediate complications. Procedure:         Pre-Anesthesia Assessment:                    - Prior to the procedure, a History and Physical was                     performed, and patient medications, allergies and                     sensitivities were reviewed. The patient's tolerance of                     previous anesthesia was reviewed.                    - The risks and benefits of the procedure and the sedation                     options and risks were discussed with the patient. All                     questions were answered and informed consent was obtained.                    - After reviewing the risks and benefits, the patient was                     deemed in satisfactory condition to undergo the procedure.                    After obtaining informed consent, the colonoscope was                     passed under direct vision. Throughout the procedure, the                     patient's blood pressure, pulse, and oxygen saturations                     were monitored continuously. The Colonoscope was                     introduced through the anus and advanced to the the cecum,                     identified by appendiceal orifice and ileocecal valve. The                     colonoscopy was performed without difficulty. The patient                     tolerated the procedure well. The quality of the bowel  preparation was poor. Findings:      A medium polyp was found in the  descending colon. The polyp was sessile.       The polyp was removed with a hot snare. Resection and retrieval were       complete.      The exam was otherwise without abnormality. Impression:        - Preparation of the colon was poor.                    - One medium polyp in the descending colon. Resected and                     retrieved.                    - The examination was otherwise normal. Recommendation:    - Discharge patient to home.                    - Await pathology results.                    - Repeat colonoscopy in 5 years for surveillance based on                     pathology results.                    - The findings and recommendations were discussed with the                     patient. Procedure Code(s): --- Professional ---                    [id number], Colonoscopy, flexible; with removal of tumor(s),                     polyp(s), or other lesion(s) by snare technique Diagnosis Code(s): --- Professional ---                    D12.4, Benign neoplasm of descending colon                    K62.5, Hemorrhage of anus and rectum CPT copyright 2014 [employer]. All rights reserved. The codes documented in this report are preliminary and upon coder review may  be revised to meet current compliance requirements. Hulen Luster, MD 11/13/2014 11:01:59 AM This report has been signed electronically. Number of Addenda: 0 Note Initiated On: 11/13/2014 10:33 AM Scope Withdrawal Time: 0 hours 11 minutes 41 seconds  Total Procedure Duration: 0 hours 15 minutes 8 seconds       [facility]

## 2014-11-13 NOTE — Transfer of Care (Signed)
Immediate Anesthesia Transfer of Care Note  Patient: Andrew Peters  Procedure(s) Performed: Procedure(s): COLONOSCOPY WITH PROPOFOL (N/A) ESOPHAGOGASTRODUODENOSCOPY (EGD) WITH PROPOFOL (N/A)  Patient Location: Endoscopy Unit  Anesthesia Type:General  Level of Consciousness: awake, alert , oriented and patient cooperative  Airway & Oxygen Therapy: Patient Spontanous Breathing and Patient connected to nasal cannula oxygen  Post-op Assessment: Report given to RN, Post -op Vital signs reviewed and stable and Patient moving all extremities X 4  Post vital signs: Reviewed and stable  Last Vitals:  Filed Vitals:   11/13/14 1106  BP: 114/68  Pulse: 69  Temp: 36 C  Resp: 15    Complications: No apparent anesthesia complications

## 2014-11-13 NOTE — Op Note (Signed)
[facility] Gastroenterology Patient Name: Andrew Peters Procedure Date: 11/13/2014 10:34 AM MRN: [id number] Account #: [id number] Date of Birth: December 25, 1965 Admit Type: Outpatient Age: 49 Room: [facility] ENDO ROOM 4 Gender: Male Note Status: Finalized Procedure:         Upper GI endoscopy Indications:       Abdominal pain in the left upper quadrant, Hematochezia,                     F/U gastric cancer Providers:         Lupita Dawn. Candace Cruise, MD Referring MD:      Lupita Dawn. Candace Cruise, MD (Referring MD) Medicines:         Monitored Anesthesia Care Complications:     No immediate complications. Procedure:         Pre-Anesthesia Assessment:                    - Prior to the procedure, a History and Physical was                     performed, and patient medications, allergies and                     sensitivities were reviewed. The patient's tolerance of                     previous anesthesia was reviewed.                    - The risks and benefits of the procedure and the sedation                     options and risks were discussed with the patient. All                     questions were answered and informed consent was obtained.                    - After reviewing the risks and benefits, the patient was                     deemed in satisfactory condition to undergo the procedure.                    After obtaining informed consent, the endoscope was passed                     under direct vision. Throughout the procedure, the                     patient's blood pressure, pulse, and oxygen saturations                     were monitored continuously. The Olympus GIF-160 endoscope                     (S#. [id number]) was introduced through the mouth, and                     advanced to the second part of duodenum. The upper GI                     endoscopy was accomplished without difficulty. The patient  tolerated the procedure well. Findings:      The examined  esophagus was normal.      Few non-bleeding gastric ulcers were found in the stomach. Biopsies were       taken with a cold forceps for histology. These appear at the       anastomosis. Patial gastrectomy.      The exam was otherwise without abnormality.      The examined duodenum was normal. Impression:        - Normal esophagus.                    - Gastric ulcers with clean base. Biopsied.                    - The examination was otherwise normal.                    - Normal examined duodenum. Recommendation:    - Discharge patient to home.                    - Observe patient's clinical course.                    - Continue present medications.                    - Await pathology results.                    - The findings and recommendations were discussed with the                     patient.                    - Avoid NSAIDS. Procedure Code(s): --- Professional ---                    [phone], Esophagogastroduodenoscopy, flexible, transoral;                     with biopsy, single or multiple Diagnosis Code(s): --- Professional ---                    K25.9, Gastric ulcer, unspecified as acute or chronic,                     without hemorrhage or perforation                    R10.12, Left upper quadrant pain                    K92.1, Melena CPT copyright 2014 American Medical Association. All rights reserved. The codes documented in this report are preliminary and upon coder review may  be revised to meet current compliance requirements. Hulen Luster, MD 11/13/2014 10:43:54 AM This report has been signed electronically. Number of Addenda: 0 Note Initiated On: 11/13/2014 10:34 AM      [facility]

## 2014-11-13 NOTE — Anesthesia Preprocedure Evaluation (Signed)
Anesthesia Evaluation  Patient identified by MRN, date of birth, ID band Patient awake    Reviewed: Allergy & Precautions, NPO status , Patient's Chart, lab work & pertinent test results  History of Anesthesia Complications Negative for: history of anesthetic complications  Airway Mallampati: II       Dental  (+) Missing   Pulmonary Current Smoker,           Cardiovascular negative cardio ROS       Neuro/Psych negative neurological ROS     GI/Hepatic negative GI ROS, Neg liver ROS,   Endo/Other  negative endocrine ROS  Renal/GU negative Renal ROS     Musculoskeletal   Abdominal   Peds  Hematology negative hematology ROS (+)   Anesthesia Other Findings   Reproductive/Obstetrics                             Anesthesia Physical Anesthesia Plan  ASA: II  Anesthesia Plan:    Post-op Pain Management:    Induction: Intravenous  Airway Management Planned: Nasal Cannula  Additional Equipment:   Intra-op Plan:   Post-operative Plan:   Informed Consent: I have reviewed the patients History and Physical, chart, labs and discussed the procedure including the risks, benefits and alternatives for the proposed anesthesia with the patient or authorized representative who has indicated his/her understanding and acceptance.     Plan Discussed with:   Anesthesia Plan Comments:         Anesthesia Quick Evaluation

## 2014-11-13 NOTE — H&P (Signed)
Date of Initial H&P:11/06/2014  History reviewed, patient examined, no change in status, stable for surgery.

## 2014-11-13 NOTE — Anesthesia Postprocedure Evaluation (Signed)
  Anesthesia Post-op Note  Patient: Andrew Peters  Procedure(s) Performed: Procedure(s): COLONOSCOPY WITH PROPOFOL (N/A) ESOPHAGOGASTRODUODENOSCOPY (EGD) WITH PROPOFOL (N/A)  Anesthesia type:No value filed.  Patient location: PACU  Post pain: Pain level controlled  Post assessment: Post-op Vital signs reviewed, Patient's Cardiovascular Status Stable, Respiratory Function Stable, Patent Airway and No signs of Nausea or vomiting  Post vital signs: Reviewed and stable  Last Vitals:  Filed Vitals:   11/13/14 1137  BP: 139/90  Pulse: 74  Temp:   Resp: 16    Level of consciousness: awake, alert  and patient cooperative  Complications: No apparent anesthesia complications

## 2014-11-14 LAB — SURGICAL PATHOLOGY

## 2014-11-17 ENCOUNTER — Encounter: Payer: Self-pay | Admitting: Gastroenterology

## 2014-11-20 ENCOUNTER — Inpatient Hospital Stay (HOSPITAL_BASED_OUTPATIENT_CLINIC_OR_DEPARTMENT_OTHER): Payer: Medicaid Other | Admitting: Hematology and Oncology

## 2014-11-20 ENCOUNTER — Encounter: Payer: Self-pay | Admitting: Hematology and Oncology

## 2014-11-20 ENCOUNTER — Inpatient Hospital Stay: Payer: Medicaid Other | Attending: Hematology and Oncology

## 2014-11-20 VITALS — BP 116/79 | HR 67 | Temp 96.1°F | Resp 18 | Ht 67.0 in | Wt 161.6 lb

## 2014-11-20 DIAGNOSIS — C163 Malignant neoplasm of pyloric antrum: Secondary | ICD-10-CM | POA: Diagnosis not present

## 2014-11-20 DIAGNOSIS — C169 Malignant neoplasm of stomach, unspecified: Secondary | ICD-10-CM

## 2014-11-20 DIAGNOSIS — D124 Benign neoplasm of descending colon: Secondary | ICD-10-CM | POA: Insufficient documentation

## 2014-11-20 DIAGNOSIS — K2950 Unspecified chronic gastritis without bleeding: Secondary | ICD-10-CM

## 2014-11-20 DIAGNOSIS — F17210 Nicotine dependence, cigarettes, uncomplicated: Secondary | ICD-10-CM

## 2014-11-20 DIAGNOSIS — K259 Gastric ulcer, unspecified as acute or chronic, without hemorrhage or perforation: Secondary | ICD-10-CM | POA: Insufficient documentation

## 2014-11-20 DIAGNOSIS — K253 Acute gastric ulcer without hemorrhage or perforation: Secondary | ICD-10-CM

## 2014-11-20 LAB — COMPREHENSIVE METABOLIC PANEL
ALT: 15 U/L — ABNORMAL LOW (ref 17–63)
AST: 30 U/L (ref 15–41)
Albumin: 4.3 g/dL (ref 3.5–5.0)
Alkaline Phosphatase: 67 U/L (ref 38–126)
Anion gap: 7 (ref 5–15)
BUN: 15 mg/dL (ref 6–20)
CO2: 25 mmol/L (ref 22–32)
Calcium: 8.7 mg/dL — ABNORMAL LOW (ref 8.9–10.3)
Chloride: 106 mmol/L (ref 101–111)
Creatinine, Ser: 1.01 mg/dL (ref 0.61–1.24)
GFR calc Af Amer: >60 mL/min (ref >60)
GFR calc non Af Amer: >60 mL/min (ref >60)
Glucose, Bld: 89 mg/dL (ref 65–99)
Potassium: 4 mmol/L (ref 3.5–5.1)
Sodium: 138 mmol/L (ref 135–145)
Total Bilirubin: 0.7 mg/dL (ref 0.3–1.2)
Total Protein: 6.7 g/dL (ref 6.5–8.1)

## 2014-11-20 LAB — CBC WITH DIFFERENTIAL/PLATELET
Basophils Absolute: 0 10*3/uL (ref 0–0.1)
Basophils Relative: 1 %
Eosinophils Absolute: 0.1 10*3/uL (ref 0–0.7)
Eosinophils Relative: 4 %
HCT: 43.8 % (ref 40.0–52.0)
Hemoglobin: 14.9 g/dL (ref 13.0–18.0)
Lymphocytes Relative: 45 %
Lymphs Abs: 1.8 10*3/uL (ref 1.0–3.6)
MCH: 31.5 pg (ref 26.0–34.0)
MCHC: 34.1 g/dL (ref 32.0–36.0)
MCV: 92.5 fL (ref 80.0–100.0)
Monocytes Absolute: 0.5 10*3/uL (ref 0.2–1.0)
Monocytes Relative: 12 %
Neutro Abs: 1.5 10*3/uL (ref 1.4–6.5)
Neutrophils Relative %: 38 %
Platelets: 226 10*3/uL (ref 150–440)
RBC: 4.73 MIL/uL (ref 4.40–5.90)
RDW: 13.6 % (ref 11.5–14.5)
WBC: 3.9 10*3/uL (ref 3.8–10.6)

## 2014-11-20 NOTE — Progress Notes (Signed)
Patient is here for 6 month follow-up of gastric cancer. He states that he has been doing ok and denies any new problems.

## 2014-11-20 NOTE — Progress Notes (Signed)
[facility] day:  11/20/2014   Chief Complaint: Jarrod Mcenery is a 49 y.o. male with stage IB gastric cancer who is seen for assessment after interval EGD and colonoscopy.  HPI: The patient was last seen in the medical oncology clinic on 11/04/2014.  At that time, he had abdominal pain and blood in his stool.  Abdominal and pelvic CT scan from 10/30/2014 had revealed no evidence of disease.  He was referred to GI for endoscopies.  EGD on 11/13/2014 revealed a normal esophagus, gastric ulcer with a clean base (biopsied) and normal duodenum. Gastric biopsy revealed oxyntic mucosa with focal moderate chronic gastritis, negative for H pylori, dysplasia, and malignancy.  Colonoscopy on 11/13/2014 revealed a poor prep. There was a medium polyp in the descending colon. Pathology revealed a tubular adenoma with no high grade dysplasia or malignancy.  Repeat colonoscopy was recommended in 5 years.  Symptomatically, he has had no further abdominal pain or bleeding.  Past Medical History  Diagnosis Date  . Cancer [facility])     Stomach    Past Surgical History  Procedure Laterality Date  . Stomach surgery      Removal of half of stomach due to cancer 2013 0r 2014.  . Colonoscopy with propofol N/A 11/13/2014    Procedure: COLONOSCOPY WITH PROPOFOL;  Surgeon: Hulen Luster, MD;  Location: [facility] ENDOSCOPY;  Service: Gastroenterology;  Laterality: N/A;  . Esophagogastroduodenoscopy (egd) with propofol N/A 11/13/2014    Procedure: ESOPHAGOGASTRODUODENOSCOPY (EGD) WITH PROPOFOL;  Surgeon: Hulen Luster, MD;  Location: [facility] ENDOSCOPY;  Service: Gastroenterology;  Laterality: N/A;    No family history on file.  Social History:  reports that he has been smoking Cigarettes.  He has been smoking about 0.25 packs per day. He does not have any smokeless tobacco history on file. He reports that he drinks alcohol. He reports that he does not use illicit drugs.  He smokes 5  cigarettes a week. The patient is alone today.  Allergies: No Known Allergies  Current Medications: No current outpatient prescriptions on file.   No current facility-administered medications for this visit.   Review of Systems:  GENERAL:  Feels good.  No fevers, sweats or weight loss. PERFORMANCE STATUS (ECOG):  0 HEENT:  No visual changes, runny nose, sore throat, mouth sores or tenderness. Lungs: No shortness of breath or cough.  No hemoptysis. Cardiac:  No chest pain, palpitations, orthopnea, or PND. GI:  Gastritis.  Abdominal pain, resolved.  No nausea, vomiting, diarrhea, melena or hematochezia. GU:  No urgency, frequency, dysuria, or hematuria. Musculoskeletal:  No back pain.  No joint pain.  No muscle tenderness. Extremities:  No pain or swelling. Skin:  No rashes or skin changes. Neuro:  No headache, numbness or weakness, balance or coordination issues. Endocrine:  No diabetes, thyroid issues, hot flashes or night sweats. Psych:  No mood changes, depression or anxiety. Pain:  No focal pain. Review of systems:  All other systems reviewed and found to be negative.  Physical Exam: Blood pressure 116/79, pulse 67, temperature 96.1 F (35.6 C), temperature source Tympanic, resp. rate 18, height _0  (1.702 m), weight 161 lb 9.6 oz (73.3 kg). GENERAL:  Well developed, well nourished, sitting comfortably in the exam room in no acute distress. MENTAL STATUS:  Alert and oriented to person, place and time. HEAD:  Short gray hair.  Mustache.  Normocephalic, atraumatic, face symmetric, no Cushingoid features. EYES:  Brown eyes.  Pupils equal round  and reactive to light and accomodation.  No conjunctivitis or scleral icterus. ENT:  Oropharynx clear without lesion.  Tongue normal. Mucous membranes moist.  RESPIRATORY:  Clear to auscultation without rales, wheezes or rhonchi. CARDIOVASCULAR:  Regular rate and rhythm without murmur, rub or gallop. ABDOMEN:  Soft, non-tender with active  bowel sounds, and no hepatosplenomegaly.  No masses. SKIN:  No rashes, ulcers or lesions. EXTREMITIES: No edema, no skin discoloration or tenderness.  No palpable cords. LYMPH NODES: No palpable cervical, supraclavicular, axillary or inguinal adenopathy  NEUROLOGICAL: Unremarkable. PSYCH:  Appropriate.  Appointment on 11/20/2014  Component Date Value Ref Range Status  . WBC 11/20/2014 3.9  3.8 - 10.6 K/uL Final  . RBC 11/20/2014 4.73  4.40 - 5.90 MIL/uL Final  . Hemoglobin 11/20/2014 14.9  13.0 - 18.0 g/dL Final  . HCT 11/20/2014 43.8  40.0 - 52.0 % Final  . MCV 11/20/2014 92.5  80.0 - 100.0 fL Final  . MCH 11/20/2014 31.5  26.0 - 34.0 pg Final  . MCHC 11/20/2014 34.1  32.0 - 36.0 g/dL Final  . RDW 11/20/2014 13.6  11.5 - 14.5 % Final  . Platelets 11/20/2014 226  150 - 440 K/uL Final  . Neutrophils Relative % 11/20/2014 38   Final  . Neutro Abs 11/20/2014 1.5  1.4 - 6.5 K/uL Final  . Lymphocytes Relative 11/20/2014 45   Final  . Lymphs Abs 11/20/2014 1.8  1.0 - 3.6 K/uL Final  . Monocytes Relative 11/20/2014 12   Final  . Monocytes Absolute 11/20/2014 0.5  0.2 - 1.0 K/uL Final  . Eosinophils Relative 11/20/2014 4   Final  . Eosinophils Absolute 11/20/2014 0.1  0 - 0.7 K/uL Final  . Basophils Relative 11/20/2014 1   Final  . Basophils Absolute 11/20/2014 0.0  0 - 0.1 K/uL Final  . Sodium 11/20/2014 138  135 - 145 mmol/L Final  . Potassium 11/20/2014 4.0  3.5 - 5.1 mmol/L Final  . Chloride 11/20/2014 106  101 - 111 mmol/L Final  . CO2 11/20/2014 25  22 - 32 mmol/L Final  . Glucose, Bld 11/20/2014 89  65 - 99 mg/dL Final  . BUN 11/20/2014 15  6 - 20 mg/dL Final  . Creatinine, Ser 11/20/2014 1.01  0.61 - 1.24 mg/dL Final  . Calcium 11/20/2014 8.7* 8.9 - 10.3 mg/dL Final  . Total Protein 11/20/2014 6.7  6.5 - 8.1 g/dL Final  . Albumin 11/20/2014 4.3  3.5 - 5.0 g/dL Final  . AST 11/20/2014 30  15 - 41 U/L Final  . ALT 11/20/2014 15* 17 - 63 U/L Final  . Alkaline Phosphatase  11/20/2014 67  38 - 126 U/L Final  . Total Bilirubin 11/20/2014 0.7  0.3 - 1.2 mg/dL Final  . GFR calc non Af Amer 11/20/2014 >60  >60 mL/min Final  . GFR calc Af Amer 11/20/2014 >60  >60 mL/min Final   Comment: (NOTE) The eGFR has been calculated using the CKD EPI equation. This calculation has not been validated in all clinical situations. eGFR's persistently <60 mL/min signify possible Chronic Kidney Disease.   Georgiann Hahn gap 11/20/2014 7  5 - 15 Final    Assessment:  Andrew Peters is a 49 y.o. male old African American gentleman with stage IB gastric carcinoma.  He presented in 08/2011 with an 8 month history of epigastric pain, early satiety, anorexia, and hematemesis.  Upper endoscopy on 08/19/2011 revealed a large gastric antral ulcer.  Biopsies demonstrated chronic active gastritis with Helicobacter pylori as  well as intramucosal adenocarcinoma.  PET scan did not reveal any disease outside of the stomach.    He underwent partial distal gastrectomy on 10/04/2011.  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  Margins were negative.  Nine lymph nodes were negative.  Pathologic stage was T2N0M0.  CEA has ranged between 4 and 5.  He developed hematochezia and LUQ abdominal pain.   Abdomen and pelvic CT scan with contrast on 10/30/2014 revealed no evidence of bowel obstruction or inflammation.  There were multiple stable liver cysts and hemangiomas.  There was no evidence of residual tumor.  There was prostate enlargement with bladder wall thickening.  EGD on 11/13/2014 revealed a normal esophagus, gastric ulcer with a clean base and normal duodenum. Gastric biopsy revealed oxyntic mucosa with focal moderate chronic gastritis, negative for H pylori, dysplasia, and malignancy.  Colonoscopy on 11/13/2014 revealed a poor prep. There was a medium polyp in the descending colon. Pathology revealed a tubular adenoma with no high grade dysplasia  or malignancy.  Repeat colonoscopy was recommended in 5 years.  Symptomatically, he feels well.  His abdominal pain has resolved.  He has had no further bleeding.  Exam is normal.  Plan: 1. Labs today:  CBC with diff, CMP, CEA. 2. Review EGD and colonoscopy reports and pathology- done. 3. Discuss omeprazole 20 mg po q day. 4. Avoid NSAIDs. 5. RTC in 3 months for MD assessment and labs (CBC with dif, CMP). 6. If doing well at next appointment, return to follow-up every 6 months.    Lequita Asal, MD  11/20/2014, 10:49 AM

## 2014-11-21 LAB — CEA: CEA: 5.1 ng/mL — ABNORMAL HIGH (ref 0.0–4.7)

## 2015-02-17 ENCOUNTER — Other Ambulatory Visit: Payer: Self-pay

## 2015-02-17 DIAGNOSIS — C169 Malignant neoplasm of stomach, unspecified: Secondary | ICD-10-CM

## 2015-02-19 ENCOUNTER — Inpatient Hospital Stay (HOSPITAL_BASED_OUTPATIENT_CLINIC_OR_DEPARTMENT_OTHER): Payer: Medicaid Other | Admitting: Hematology and Oncology

## 2015-02-19 ENCOUNTER — Encounter: Payer: Self-pay | Admitting: Hematology and Oncology

## 2015-02-19 ENCOUNTER — Inpatient Hospital Stay: Payer: Medicaid Other | Attending: Hematology and Oncology

## 2015-02-19 VITALS — BP 118/80 | HR 66 | Temp 96.7°F | Resp 18 | Ht 67.0 in | Wt 161.4 lb

## 2015-02-19 DIAGNOSIS — C163 Malignant neoplasm of pyloric antrum: Secondary | ICD-10-CM | POA: Diagnosis not present

## 2015-02-19 DIAGNOSIS — F17210 Nicotine dependence, cigarettes, uncomplicated: Secondary | ICD-10-CM | POA: Insufficient documentation

## 2015-02-19 DIAGNOSIS — D124 Benign neoplasm of descending colon: Secondary | ICD-10-CM | POA: Insufficient documentation

## 2015-02-19 DIAGNOSIS — C169 Malignant neoplasm of stomach, unspecified: Secondary | ICD-10-CM

## 2015-02-19 DIAGNOSIS — K2950 Unspecified chronic gastritis without bleeding: Secondary | ICD-10-CM | POA: Diagnosis not present

## 2015-02-19 DIAGNOSIS — Z903 Acquired absence of stomach [part of]: Secondary | ICD-10-CM

## 2015-02-19 LAB — CBC WITH DIFFERENTIAL/PLATELET
Basophils Absolute: 0.1 10*3/uL (ref 0–0.1)
Basophils Relative: 1 %
Eosinophils Absolute: 0.2 10*3/uL (ref 0–0.7)
Eosinophils Relative: 3 %
HCT: 45.6 % (ref 40.0–52.0)
Hemoglobin: 15.4 g/dL (ref 13.0–18.0)
Lymphocytes Relative: 47 %
Lymphs Abs: 2.2 10*3/uL (ref 1.0–3.6)
MCH: 31.4 pg (ref 26.0–34.0)
MCHC: 33.8 g/dL (ref 32.0–36.0)
MCV: 92.9 fL (ref 80.0–100.0)
Monocytes Absolute: 0.5 10*3/uL (ref 0.2–1.0)
Monocytes Relative: 10 %
Neutro Abs: 1.9 10*3/uL (ref 1.4–6.5)
Neutrophils Relative %: 39 %
Platelets: 242 10*3/uL (ref 150–440)
RBC: 4.91 MIL/uL (ref 4.40–5.90)
RDW: 14 % (ref 11.5–14.5)
WBC: 4.8 10*3/uL (ref 3.8–10.6)

## 2015-02-19 LAB — COMPREHENSIVE METABOLIC PANEL
ALT: 14 U/L — ABNORMAL LOW (ref 17–63)
AST: 19 U/L (ref 15–41)
Albumin: 4.4 g/dL (ref 3.5–5.0)
Alkaline Phosphatase: 74 U/L (ref 38–126)
Anion gap: 6 (ref 5–15)
BUN: 17 mg/dL (ref 6–20)
CO2: 25 mmol/L (ref 22–32)
Calcium: 9.2 mg/dL (ref 8.9–10.3)
Chloride: 106 mmol/L (ref 101–111)
Creatinine, Ser: 1.07 mg/dL (ref 0.61–1.24)
GFR calc Af Amer: >60 mL/min (ref >60)
GFR calc non Af Amer: >60 mL/min (ref >60)
Glucose, Bld: 84 mg/dL (ref 65–99)
Potassium: 3.6 mmol/L (ref 3.5–5.1)
Sodium: 137 mmol/L (ref 135–145)
Total Bilirubin: 1.1 mg/dL (ref 0.3–1.2)
Total Protein: 7.1 g/dL (ref 6.5–8.1)

## 2015-02-19 NOTE — Progress Notes (Signed)
[facility] day:  02/19/2015   Chief Complaint: Andrew Peters is a 50 y.o. male with stage IB gastric cancer who is seen for 3 month assessment.  HPI: The patient was last seen in the medical oncology clinic on 11/20/2014.  At that time, he was seen for assessment after interval EGD and colonoscopy. EGD on 11/13/2014 revealed a normal esophagus, gastric ulcer with a clean base (biopsied) and normal duodenum. Gastric biopsy revealed oxyntic mucosa with focal moderate chronic gastritis, negative for H pylori, dysplasia, and malignancy.  Colonoscopy on 11/13/2014 revealed a poor prep. There was a medium polyp in the descending colon. Pathology revealed a tubular adenoma with no high grade dysplasia or malignancy.  Repeat colonoscopy was recommended in 5 years.  Symptomatically, he was doing well.  During the interim, he has continued to do well.  He denies any abdominal symptoms.  He is eating well and gaining weight.  He denies pain.  He is active roller skating.  Past Medical History  Diagnosis Date  . Cancer [facility])     Stomach    Past Surgical History  Procedure Laterality Date  . Stomach surgery      Removal of half of stomach due to cancer 2013 0r 2014.  . Colonoscopy with propofol N/A 11/13/2014    Procedure: COLONOSCOPY WITH PROPOFOL;  Surgeon: Hulen Luster, MD;  Location: [facility] ENDOSCOPY;  Service: Gastroenterology;  Laterality: N/A;  . Esophagogastroduodenoscopy (egd) with propofol N/A 11/13/2014    Procedure: ESOPHAGOGASTRODUODENOSCOPY (EGD) WITH PROPOFOL;  Surgeon: Hulen Luster, MD;  Location: [facility] ENDOSCOPY;  Service: Gastroenterology;  Laterality: N/A;    No family history on file.  Social History:  reports that he has been smoking Cigarettes.  He has been smoking about 0.25 packs per day. He does not have any smokeless tobacco history on file. He reports that he drinks alcohol. He reports that he does not use illicit drugs.  He loves to  roller skate.  He smokes 5 cigarettes a week. The patient is alone today.  Allergies: No Known Allergies  Current Medications: No current outpatient prescriptions on file.   No current facility-administered medications for this visit.   Review of Systems:  GENERAL:  Feels good.  No fevers or sweats.  Gaining weight.  Goal weight 175 pounds. PERFORMANCE STATUS (ECOG):  0 HEENT:  No visual changes, runny nose, sore throat, mouth sores or tenderness. Lungs: No shortness of breath or cough.  No hemoptysis. Cardiac:  No chest pain, palpitations, orthopnea, or PND. GI:  No abdominal pain.  No nausea, vomiting, diarrhea, melena or hematochezia. GU:  No urgency, frequency, dysuria, or hematuria. Musculoskeletal:  No back pain.  No joint pain.  No muscle tenderness. Extremities:  No pain or swelling. Skin:  No rashes or skin changes. Neuro:  No headache, numbness or weakness, balance or coordination issues. Endocrine:  No diabetes, thyroid issues, hot flashes or night sweats. Psych:  No mood changes, depression or anxiety. Pain:  No focal pain. Review of systems:  All other systems reviewed and found to be negative.  Physical Exam: Blood pressure 118/80, pulse 66, temperature 96.7 F (35.9 C), temperature source Tympanic, resp. rate 18, height 5' 7"  (1.702 m), weight 161 lb 6 oz (73.2 kg). GENERAL:  Well developed, well nourished, sitting comfortably in the exam room in no acute distress. MENTAL STATUS:  Alert and oriented to person, place and time. HEAD:  Short gray hair and beard.  Normocephalic,  atraumatic, face symmetric, no Cushingoid features. EYES:  Brown eyes.  Pupils equal round and reactive to light and accomodation.  No conjunctivitis or scleral icterus. ENT:  Oropharynx clear without lesion.  Tongue normal. Mucous membranes moist.  RESPIRATORY:  Clear to auscultation without rales, wheezes or rhonchi. CARDIOVASCULAR:  Regular rate and rhythm without murmur, rub or  gallop. ABDOMEN:  Soft, non-tender with active bowel sounds, and no hepatosplenomegaly.  No masses. SKIN:  No rashes, ulcers or lesions. EXTREMITIES: No edema, no skin discoloration or tenderness.  No palpable cords. LYMPH NODES: No palpable cervical, supraclavicular, axillary or inguinal adenopathy  NEUROLOGICAL: Unremarkable. PSYCH:  Appropriate.  Appointment on 02/19/2015  Component Date Value Ref Range Status  . WBC 02/19/2015 4.8  3.8 - 10.6 K/uL Final  . RBC 02/19/2015 4.91  4.40 - 5.90 MIL/uL Final  . Hemoglobin 02/19/2015 15.4  13.0 - 18.0 g/dL Final  . HCT 02/19/2015 45.6  40.0 - 52.0 % Final  . MCV 02/19/2015 92.9  80.0 - 100.0 fL Final  . MCH 02/19/2015 31.4  26.0 - 34.0 pg Final  . MCHC 02/19/2015 33.8  32.0 - 36.0 g/dL Final  . RDW 02/19/2015 14.0  11.5 - 14.5 % Final  . Platelets 02/19/2015 242  150 - 440 K/uL Final  . Neutrophils Relative % 02/19/2015 39   Final  . Neutro Abs 02/19/2015 1.9  1.4 - 6.5 K/uL Final  . Lymphocytes Relative 02/19/2015 47   Final  . Lymphs Abs 02/19/2015 2.2  1.0 - 3.6 K/uL Final  . Monocytes Relative 02/19/2015 10   Final  . Monocytes Absolute 02/19/2015 0.5  0.2 - 1.0 K/uL Final  . Eosinophils Relative 02/19/2015 3   Final  . Eosinophils Absolute 02/19/2015 0.2  0 - 0.7 K/uL Final  . Basophils Relative 02/19/2015 1   Final  . Basophils Absolute 02/19/2015 0.1  0 - 0.1 K/uL Final  . Sodium 02/19/2015 137  135 - 145 mmol/L Final  . Potassium 02/19/2015 3.6  3.5 - 5.1 mmol/L Final  . Chloride 02/19/2015 106  101 - 111 mmol/L Final  . CO2 02/19/2015 25  22 - 32 mmol/L Final  . Glucose, Bld 02/19/2015 84  65 - 99 mg/dL Final  . BUN 02/19/2015 17  6 - 20 mg/dL Final  . Creatinine, Ser 02/19/2015 1.07  0.61 - 1.24 mg/dL Final  . Calcium 02/19/2015 9.2  8.9 - 10.3 mg/dL Final  . Total Protein 02/19/2015 7.1  6.5 - 8.1 g/dL Final  . Albumin 02/19/2015 4.4  3.5 - 5.0 g/dL Final  . AST 02/19/2015 19  15 - 41 U/L Final  . ALT 02/19/2015 14* 17  - 63 U/L Final  . Alkaline Phosphatase 02/19/2015 74  38 - 126 U/L Final  . Total Bilirubin 02/19/2015 1.1  0.3 - 1.2 mg/dL Final  . GFR calc non Af Amer 02/19/2015 >60  >60 mL/min Final  . GFR calc Af Amer 02/19/2015 >60  >60 mL/min Final   Comment: (NOTE) The eGFR has been calculated using the CKD EPI equation. This calculation has not been validated in all clinical situations. eGFR's persistently <60 mL/min signify possible Chronic Kidney Disease.   Georgiann Hahn gap 02/19/2015 6  5 - 15 Final    Assessment:  Andrew Peters is a 50 y.o. male old African American gentleman with stage IB gastric carcinoma.  He presented in 08/2011 with an 8 month history of epigastric pain, early satiety, anorexia, and hematemesis.  Upper endoscopy on 08/19/2011 revealed  a large gastric antral ulcer.  Biopsies demonstrated chronic active gastritis with Helicobacter pylori as well as intramucosal adenocarcinoma.  PET scan did not reveal any disease outside of the stomach.    He underwent partial distal gastrectomy on 10/04/2011.  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  Margins were negative.  Nine lymph nodes were negative.  Pathologic stage was T2N0M0.  CEA has ranged between 4 and 5.  He developed hematochezia and LUQ abdominal pain.   Abdomen and pelvic CT scan with contrast on 10/30/2014 revealed no evidence of bowel obstruction or inflammation.  There were multiple stable liver cysts and hemangiomas.  There was no evidence of residual tumor.  There was prostate enlargement with bladder wall thickening.  EGD on 11/13/2014 revealed a normal esophagus, gastric ulcer with a clean base and normal duodenum. Gastric biopsy revealed oxyntic mucosa with focal moderate chronic gastritis, negative for H pylori, dysplasia, and malignancy.  Colonoscopy on 11/13/2014 revealed a poor prep. There was a medium polyp in the descending colon. Pathology revealed a  tubular adenoma with no high grade dysplasia or malignancy.  Repeat colonoscopy was recommended in 5 years.  Symptomatically, he feels well.  He is gaining weight.  Exam is normal.  Plan: 1.  Labs today:  CBC with diff, CMP. 2.  RTC in 6 months for MD assessment and labs (CBC with dif, CMP, ferritin, B12, folate).    Lequita Asal, MD  02/19/2015, 11:31 AM

## 2015-02-20 ENCOUNTER — Other Ambulatory Visit: Payer: Self-pay

## 2015-02-20 DIAGNOSIS — C169 Malignant neoplasm of stomach, unspecified: Secondary | ICD-10-CM

## 2015-02-26 NOTE — Progress Notes (Signed)
  Oncology Nurse Navigator Documentation  Navigator Location: [facility] (02/26/15 1500)                 Barriers/Navigation Needs: Financial (02/26/15 1500)                          Time Spent with Patient: 30 (02/26/15 1500)   Receied request for assistance with current power bill. Assisted with [employer]

## 2015-07-14 ENCOUNTER — Encounter: Payer: Self-pay | Admitting: *Deleted

## 2015-08-20 ENCOUNTER — Inpatient Hospital Stay: Payer: Medicaid Other | Attending: Hematology and Oncology

## 2015-08-20 ENCOUNTER — Inpatient Hospital Stay (HOSPITAL_BASED_OUTPATIENT_CLINIC_OR_DEPARTMENT_OTHER): Payer: Medicaid Other | Admitting: Hematology and Oncology

## 2015-08-20 ENCOUNTER — Encounter: Payer: Self-pay | Admitting: Hematology and Oncology

## 2015-08-20 VITALS — BP 138/94 | HR 60 | Temp 98.5°F | Resp 18 | Wt 151.3 lb

## 2015-08-20 DIAGNOSIS — R1032 Left lower quadrant pain: Secondary | ICD-10-CM | POA: Diagnosis not present

## 2015-08-20 DIAGNOSIS — Z86010 Personal history of colonic polyps: Secondary | ICD-10-CM | POA: Diagnosis not present

## 2015-08-20 DIAGNOSIS — K7689 Other specified diseases of liver: Secondary | ICD-10-CM | POA: Insufficient documentation

## 2015-08-20 DIAGNOSIS — R197 Diarrhea, unspecified: Secondary | ICD-10-CM | POA: Insufficient documentation

## 2015-08-20 DIAGNOSIS — R079 Chest pain, unspecified: Secondary | ICD-10-CM

## 2015-08-20 DIAGNOSIS — M25512 Pain in left shoulder: Secondary | ICD-10-CM | POA: Insufficient documentation

## 2015-08-20 DIAGNOSIS — Z903 Acquired absence of stomach [part of]: Secondary | ICD-10-CM | POA: Diagnosis not present

## 2015-08-20 DIAGNOSIS — R634 Abnormal weight loss: Secondary | ICD-10-CM

## 2015-08-20 DIAGNOSIS — B9681 Helicobacter pylori [H. pylori] as the cause of diseases classified elsewhere: Secondary | ICD-10-CM | POA: Insufficient documentation

## 2015-08-20 DIAGNOSIS — R110 Nausea: Secondary | ICD-10-CM

## 2015-08-20 DIAGNOSIS — F17210 Nicotine dependence, cigarettes, uncomplicated: Secondary | ICD-10-CM | POA: Diagnosis not present

## 2015-08-20 DIAGNOSIS — C169 Malignant neoplasm of stomach, unspecified: Secondary | ICD-10-CM | POA: Insufficient documentation

## 2015-08-20 DIAGNOSIS — K2950 Unspecified chronic gastritis without bleeding: Secondary | ICD-10-CM

## 2015-08-20 DIAGNOSIS — Z85028 Personal history of other malignant neoplasm of stomach: Secondary | ICD-10-CM | POA: Diagnosis not present

## 2015-08-20 DIAGNOSIS — R0789 Other chest pain: Secondary | ICD-10-CM

## 2015-08-20 DIAGNOSIS — R1013 Epigastric pain: Secondary | ICD-10-CM | POA: Insufficient documentation

## 2015-08-20 LAB — COMPREHENSIVE METABOLIC PANEL
ALT: 15 U/L — ABNORMAL LOW (ref 17–63)
AST: 16 U/L (ref 15–41)
Albumin: 4.4 g/dL (ref 3.5–5.0)
Alkaline Phosphatase: 72 U/L (ref 38–126)
Anion gap: 3 — ABNORMAL LOW (ref 5–15)
BUN: 22 mg/dL — ABNORMAL HIGH (ref 6–20)
CO2: 29 mmol/L (ref 22–32)
Calcium: 9.3 mg/dL (ref 8.9–10.3)
Chloride: 109 mmol/L (ref 101–111)
Creatinine, Ser: 1.07 mg/dL (ref 0.61–1.24)
GFR calc Af Amer: >60 mL/min (ref >60)
GFR calc non Af Amer: >60 mL/min (ref >60)
Glucose, Bld: 98 mg/dL (ref 65–99)
Potassium: 4.2 mmol/L (ref 3.5–5.1)
Sodium: 141 mmol/L (ref 135–145)
Total Bilirubin: 0.7 mg/dL (ref 0.3–1.2)
Total Protein: 7.1 g/dL (ref 6.5–8.1)

## 2015-08-20 LAB — CBC WITH DIFFERENTIAL/PLATELET
Basophils Absolute: 0 10*3/uL (ref 0–0.1)
Basophils Relative: 1 %
Eosinophils Absolute: 0.1 10*3/uL (ref 0–0.7)
Eosinophils Relative: 2 %
HCT: 42.9 % (ref 40.0–52.0)
Hemoglobin: 14.9 g/dL (ref 13.0–18.0)
Lymphocytes Relative: 42 %
Lymphs Abs: 2.1 10*3/uL (ref 1.0–3.6)
MCH: 32.2 pg (ref 26.0–34.0)
MCHC: 34.7 g/dL (ref 32.0–36.0)
MCV: 92.9 fL (ref 80.0–100.0)
Monocytes Absolute: 0.5 10*3/uL (ref 0.2–1.0)
Monocytes Relative: 9 %
Neutro Abs: 2.3 10*3/uL (ref 1.4–6.5)
Neutrophils Relative %: 46 %
Platelets: 237 10*3/uL (ref 150–440)
RBC: 4.61 MIL/uL (ref 4.40–5.90)
RDW: 13.7 % (ref 11.5–14.5)
WBC: 5 10*3/uL (ref 3.8–10.6)

## 2015-08-20 LAB — FOLATE: Folate: 8.7 ng/mL (ref >5.9)

## 2015-08-20 LAB — FERRITIN: Ferritin: 55 ng/mL (ref 24–336)

## 2015-08-20 LAB — VITAMIN B12: Vitamin B-12: 416 pg/mL (ref 180–914)

## 2015-08-20 MED ORDER — OMEPRAZOLE 20 MG PO CPDR: 20.0000 mg | DELAYED_RELEASE_CAPSULE | Freq: Two times a day (BID) | ORAL | Status: DC

## 2015-08-20 NOTE — Progress Notes (Signed)
[place] [facility] day:  08/20/2015   Chief Complaint: Andrew Peters is a 49 y.o. male with stage IB gastric cancer who is seen for 6 month assessment.  HPI: The patient was last seen in the medical oncology clinic on 02/19/2015.  At that time,  he felt well.  He was gaining weight.  Exam was normal.  CBC with differential and CMP were normal.  During the interim, he states that his weight has been up and down. He notes three different kinds of pain. He describes an intermittent stabbing chest pain not associated with activty. He describes stomach pains which feels like knots.  Sharp pain causes nausea and diarrhea. He denies any associated foods. He has 3-4 bowel movements a day. He denies any melena or hematochezia.  He has a popping sensation in his shoulder.   Past Medical History  Diagnosis Date  . Cancer [facility])     Stomach    Past Surgical History  Procedure Laterality Date  . Stomach surgery      Removal of half of stomach due to cancer 2013 0r 2014.  . Colonoscopy with propofol N/A 11/13/2014    Procedure: COLONOSCOPY WITH PROPOFOL;  Surgeon: Hulen Luster, MD;  Location: [facility] ENDOSCOPY;  Service: Gastroenterology;  Laterality: N/A;  . Esophagogastroduodenoscopy (egd) with propofol N/A 11/13/2014    Procedure: ESOPHAGOGASTRODUODENOSCOPY (EGD) WITH PROPOFOL;  Surgeon: Hulen Luster, MD;  Location: [facility] ENDOSCOPY;  Service: Gastroenterology;  Laterality: N/A;    History reviewed. No pertinent family history.  Social History:  reports that he has been smoking Cigarettes.  He has been smoking about 0.25 packs per day. He does not have any smokeless tobacco history on file. He reports that he drinks alcohol. He reports that he does not use illicit drugs.  He works at the [employer].  He loves to roller skate.  He smokes 5 cigarettes a week. The patient is alone today.  Allergies: No Known Allergies  Current Medications: No current  outpatient prescriptions on file.   No current facility-administered medications for this visit.   Review of Systems:  GENERAL:  Feels "ok".  No fevers or sweats.  Weight loss 10 pounds. PERFORMANCE STATUS (ECOG):  0 HEENT:  No visual changes, runny nose, sore throat, mouth sores or tenderness. Lungs: No shortness of breath or cough.  No hemoptysis. Cardiac:  Intermittent sharp chest pain.  No palpitations, orthopnea, or PND. GI:  Abdominal pain (see HPI).  Some nausea.  Some diarrhea.  3-4 bowel movements a day.  No vomiting, melena or hematochezia. GU:  No urgency, frequency, dysuria, or hematuria. Musculoskeletal:  No back pain.  Shoulder popping.  No muscle tenderness. Extremities:  No pain or swelling. Skin:  No rashes or skin changes. Neuro:  No headache, numbness or weakness, balance or coordination issues. Endocrine:  No diabetes, thyroid issues, hot flashes or night sweats. Psych:  No mood changes, depression or anxiety. Pain:  Pain in chest, stomach, and left shoulder. Review of systems:  All other systems reviewed and found to be negative.  Physical Exam: Blood pressure 138/94, pulse 60, temperature 98.5 F (36.9 C), temperature source Tympanic, resp. rate 18, weight 151 lb 5.5 oz (68.65 kg). GENERAL:  Well developed, well nourished, gentleman sitting comfortably in the exam room in no acute distress. MENTAL STATUS:  Alert and oriented to person, place and time. HEAD:  Short gray hair.  Normocephalic, atraumatic, face symmetric, no Cushingoid features. EYES:  Brown eyes.  Pupils equal round and reactive to light and accomodation.  No conjunctivitis or scleral icterus. ENT:  Oropharynx clear without lesion.  Tongue normal. Mucous membranes moist.  RESPIRATORY:  Clear to auscultation without rales, wheezes or rhonchi. CARDIOVASCULAR:  Regular rate and rhythm without murmur, rub or gallop. ABDOMEN:  Soft, non-tender with active bowel sounds, and no hepatosplenomegaly.  No  masses. SKIN:  No rashes, ulcers or lesions. EXTREMITIES: No edema, no skin discoloration or tenderness.  No palpable cords. LYMPH NODES: No palpable cervical, supraclavicular, axillary or inguinal adenopathy  NEUROLOGICAL: Unremarkable. PSYCH:  Appropriate.  Appointment on 08/20/2015  Component Date Value Ref Range Status  . Sodium 08/20/2015 141  135 - 145 mmol/L Final  . Potassium 08/20/2015 4.2  3.5 - 5.1 mmol/L Final  . Chloride 08/20/2015 109  101 - 111 mmol/L Final  . CO2 08/20/2015 29  22 - 32 mmol/L Final  . Glucose, Bld 08/20/2015 98  65 - 99 mg/dL Final  . BUN 08/20/2015 22* 6 - 20 mg/dL Final  . Creatinine, Ser 08/20/2015 1.07  0.61 - 1.24 mg/dL Final  . Calcium 08/20/2015 9.3  8.9 - 10.3 mg/dL Final  . Total Protein 08/20/2015 7.1  6.5 - 8.1 g/dL Final  . Albumin 08/20/2015 4.4  3.5 - 5.0 g/dL Final  . AST 08/20/2015 16  15 - 41 U/L Final  . ALT 08/20/2015 15* 17 - 63 U/L Final  . Alkaline Phosphatase 08/20/2015 72  38 - 126 U/L Final  . Total Bilirubin 08/20/2015 0.7  0.3 - 1.2 mg/dL Final  . GFR calc non Af Amer 08/20/2015 >60  >60 mL/min Final  . GFR calc Af Amer 08/20/2015 >60  >60 mL/min Final   Comment: (NOTE) The eGFR has been calculated using the CKD EPI equation. This calculation has not been validated in all clinical situations. eGFR's persistently <60 mL/min signify possible Chronic Kidney Disease.   . Anion gap 08/20/2015 3* 5 - 15 Final  . WBC 08/20/2015 5.0  3.8 - 10.6 K/uL Final  . RBC 08/20/2015 4.61  4.40 - 5.90 MIL/uL Final  . Hemoglobin 08/20/2015 14.9  13.0 - 18.0 g/dL Final  . HCT 08/20/2015 42.9  40.0 - 52.0 % Final  . MCV 08/20/2015 92.9  80.0 - 100.0 fL Final  . MCH 08/20/2015 32.2  26.0 - 34.0 pg Final  . MCHC 08/20/2015 34.7  32.0 - 36.0 g/dL Final  . RDW 08/20/2015 13.7  11.5 - 14.5 % Final  . Platelets 08/20/2015 237  150 - 440 K/uL Final  . Neutrophils Relative % 08/20/2015 46   Final  . Neutro Abs 08/20/2015 2.3  1.4 - 6.5 K/uL  Final  . Lymphocytes Relative 08/20/2015 42   Final  . Lymphs Abs 08/20/2015 2.1  1.0 - 3.6 K/uL Final  . Monocytes Relative 08/20/2015 9   Final  . Monocytes Absolute 08/20/2015 0.5  0.2 - 1.0 K/uL Final  . Eosinophils Relative 08/20/2015 2   Final  . Eosinophils Absolute 08/20/2015 0.1  0 - 0.7 K/uL Final  . Basophils Relative 08/20/2015 1   Final  . Basophils Absolute 08/20/2015 0.0  0 - 0.1 K/uL Final    Assessment:  Andrew Peters is a 50 y.o. male old African American gentleman with stage IB gastric carcinoma.  He presented in 08/2011 with an 8 month history of epigastric pain, early satiety, anorexia, and hematemesis.  Upper endoscopy on 08/19/2011 revealed a large gastric antral ulcer.  Biopsies demonstrated chronic active  gastritis with Helicobacter pylori as well as intramucosal adenocarcinoma.  PET scan did not reveal any disease outside of the stomach.    He underwent partial distal gastrectomy on 10/04/2011.  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  Margins were negative.  Nine lymph nodes were negative.  Pathologic stage was T2N0M0.  CEA has ranged between 4 and 5.  He developed hematochezia and LUQ abdominal pain.   Abdomen and pelvic CT scan with contrast on 10/30/2014 revealed no evidence of bowel obstruction or inflammation.  There were multiple stable liver cysts and hemangiomas.  There was no evidence of residual tumor.  There was prostate enlargement with bladder wall thickening.  EGD on 11/13/2014 revealed a normal esophagus, gastric ulcer with a clean base and normal duodenum. Gastric biopsy revealed oxyntic mucosa with focal moderate chronic gastritis, negative for H pylori, dysplasia, and malignancy.  Colonoscopy on 11/13/2014 revealed a poor prep. There was a medium polyp in the descending colon. Pathology revealed a tubular adenoma with no high grade dysplasia or malignancy.  Repeat colonoscopy was recommended  in 5 years.  Symptomatically, he has abdominal pain and intermittent chest pain.  He may have reflux.  He has lost 10 pounds.  Exam is normal.  Plan: 1.  Labs today:  CBC with diff, CMP, ferritin, B12, folate. 2.  Schedule abdominal and pelvic CT scan: gastric cancer, abdominal pain, weight loss. 3.  Rx: omeprazole 20 mg po BID (dis:#60 with 1 refill). 4.  Encourage patient finding a PCP. 5.  Instruct patient to go to ER for evaluation of chest pain. 6.  Referral for GI follow-up (previously saw Dr. Candace Cruise). 7.  RTC after CT scan.    Lequita Asal, MD  08/20/2015, 11:19 AM

## 2015-08-20 NOTE — Progress Notes (Signed)
Patient is here for a follow up, he notes some chest pain at night when he is laying in bed. He denies any arm pain.he also has abdominal pain and pain in left shoulder. He would like to speak to [place] today as well.

## 2015-08-25 ENCOUNTER — Telehealth: Payer: Self-pay | Admitting: *Deleted

## 2015-08-25 NOTE — Telephone Encounter (Signed)
Called pt and left message that he has appt at [facility] with GI on 8/14 and then PCP at [facility] with Dr. Netty Starring on 8/16 at 1:30.  I have asked him to call me back and make sure he can get to these appt or else I need to cancel them. Will await his call back

## 2015-08-31 ENCOUNTER — Ambulatory Visit
Admission: RE | Admit: 2015-08-31 | Discharge: 2015-08-31 | Disposition: A | Discharge status: Home / Self Care | Payer: Medicaid Other | Source: Ambulatory Visit | Attending: Hematology and Oncology | Admitting: Hematology and Oncology

## 2015-08-31 DIAGNOSIS — N281 Cyst of kidney, acquired: Secondary | ICD-10-CM | POA: Insufficient documentation

## 2015-08-31 DIAGNOSIS — R634 Abnormal weight loss: Secondary | ICD-10-CM | POA: Diagnosis present

## 2015-08-31 DIAGNOSIS — C169 Malignant neoplasm of stomach, unspecified: Secondary | ICD-10-CM | POA: Insufficient documentation

## 2015-08-31 DIAGNOSIS — N400 Enlarged prostate without lower urinary tract symptoms: Secondary | ICD-10-CM | POA: Insufficient documentation

## 2015-08-31 DIAGNOSIS — R1013 Epigastric pain: Secondary | ICD-10-CM | POA: Insufficient documentation

## 2015-08-31 DIAGNOSIS — D1800 Hemangioma unspecified site: Secondary | ICD-10-CM | POA: Insufficient documentation

## 2015-08-31 DIAGNOSIS — K7689 Other specified diseases of liver: Secondary | ICD-10-CM | POA: Diagnosis not present

## 2015-08-31 MED ORDER — IOPAMIDOL (ISOVUE-300) INJECTION 61%
100.0000 mL | Freq: Once | INTRAVENOUS | Status: AC | PRN
  Administered 2015-08-31: 100 mL via INTRAVENOUS

## 2015-09-07 NOTE — Progress Notes (Signed)
[place] [facility] day:  09/08/15   Chief Complaint: Andrew Peters is a 50 y.o. male with stage IB gastric cancer who is seen for review of interval CT scans.  HPI: The patient was last seen in the medical oncology clinic on 08/20/2015.  At that time, he had abdominal pain and intermittent chest pain.  He had lost 10 pounds.  Exam was normal.  Labs included a normal CBC with diff, CMP, B12 (416), and folate (8.7).  He was prescribed omeprazole and referred to GI.  He was scheduled for follow-up imaging.  Abdomen and pelvic CT scan on 08/31/2015 revealed no evidence of metastatic disease.  An appointment was scheduled with GI at the [facility] on 09/21/2015.  He did not fill his prescription for omeprazole.  Symptomatically, he notes a little back pain.  He had a little pain in his side.  He denies any epigastric pain or reflux.  He has gained 2 pounds.   Past Medical History:  Diagnosis Date  . Cancer [facility]) 2012   Gastrectomy    Past Surgical History:  Procedure Laterality Date  . COLONOSCOPY WITH PROPOFOL N/A 11/13/2014   Procedure: COLONOSCOPY WITH PROPOFOL;  Surgeon: Hulen Luster, MD;  Location: [facility] ENDOSCOPY;  Service: Gastroenterology;  Laterality: N/A;  . ESOPHAGOGASTRODUODENOSCOPY (EGD) WITH PROPOFOL N/A 11/13/2014   Procedure: ESOPHAGOGASTRODUODENOSCOPY (EGD) WITH PROPOFOL;  Surgeon: Hulen Luster, MD;  Location: [facility] ENDOSCOPY;  Service: Gastroenterology;  Laterality: N/A;  . 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    No family history on file.  Social History:  reports that he has been smoking Cigarettes.  He has been smoking about 0.25 packs per day. He does not have any smokeless tobacco history on file. He reports that he drinks alcohol. He reports that he does not use drugs.  He works at the [employer].  He loves to roller skate.  He smokes 5 cigarettes a week. The patient is alone  today.  Allergies: No Known Allergies  Current Medications: Current Outpatient Prescriptions  Medication Sig Dispense Refill  . omeprazole (PRILOSEC) 20 MG capsule Take 1 capsule (20 mg total) by mouth 2 (two) times daily before a meal. (Patient not taking: Reported on 09/08/2015) 60 capsule 1   No current facility-administered medications for this visit.    Review of Systems:  GENERAL:  Feels good.  No fevers or sweats.  Weight gain of 2 pounds after loss 10 pounds. PERFORMANCE STATUS (ECOG):  0 HEENT:  No visual changes, runny nose, sore throat, mouth sores or tenderness. Lungs: No shortness of breath or cough.  No hemoptysis. Cardiac:  No chest pain, palpitations, orthopnea, or PND. GI:  No abdominal pain.  Transient die pain.  No vomiting, melena or hematochezia. GU:  No urgency, frequency, dysuria, or hematuria. Musculoskeletal:  No back pain.  Shoulder popping.  No muscle tenderness. Extremities:  No pain or swelling. Skin:  No rashes or skin changes. Neuro:  No headache, numbness or weakness, balance or coordination issues. Endocrine:  No diabetes, thyroid issues, hot flashes or night sweats. Psych:  No mood changes, depression or anxiety. Pain:  Pain in chest, stomach, and left shoulder. Review of systems:  All other systems reviewed and found to be negative.  Physical Exam: Blood pressure 125/82, pulse 77, temperature (!) 96.9 F (36.1 C), temperature source Tympanic, height 5' 7"  (1.702 m), weight 153 lb 12.3  oz (69.7 kg), SpO2 97 %. GENERAL:  Well developed, well nourished, gentleman sitting comfortably in the exam room in no acute distress. MENTAL STATUS:  Alert and oriented to person, place and time. HEAD:  Short gray hair.  Normocephalic, atraumatic, face symmetric, no Cushingoid features. EYES:  Brown eyes.  Pupils equal round and reactive to light and accomodation.  No conjunctivitis or scleral icterus. ENT:  Oropharynx clear without lesion.  Missing front tooth.   Tongue normal. Mucous membranes moist.  RESPIRATORY:  Clear to auscultation without rales, wheezes or rhonchi. CARDIOVASCULAR:  Regular rate and rhythm without murmur, rub or gallop. ABDOMEN:  Soft, non-tender with active bowel sounds, and no hepatosplenomegaly.  No masses. SKIN:  No rashes, ulcers or lesions. EXTREMITIES: No edema, no skin discoloration or tenderness.  No palpable cords. LYMPH NODES: No palpable cervical, supraclavicular, axillary or inguinal adenopathy  NEUROLOGICAL: Unremarkable. PSYCH:  Appropriate.  No visits with results within 3 Day(s) from this visit.  Latest known visit with results is:  Appointment on 08/20/2015  Component Date Value Ref Range Status  . Sodium 08/20/2015 141  135 - 145 mmol/L Final  . Potassium 08/20/2015 4.2  3.5 - 5.1 mmol/L Final  . Chloride 08/20/2015 109  101 - 111 mmol/L Final  . CO2 08/20/2015 29  22 - 32 mmol/L Final  . Glucose, Bld 08/20/2015 98  65 - 99 mg/dL Final  . BUN 08/20/2015 22* 6 - 20 mg/dL Final  . Creatinine, Ser 08/20/2015 1.07  0.61 - 1.24 mg/dL Final  . Calcium 08/20/2015 9.3  8.9 - 10.3 mg/dL Final  . Total Protein 08/20/2015 7.1  6.5 - 8.1 g/dL Final  . Albumin 08/20/2015 4.4  3.5 - 5.0 g/dL Final  . AST 08/20/2015 16  15 - 41 U/L Final  . ALT 08/20/2015 15* 17 - 63 U/L Final  . Alkaline Phosphatase 08/20/2015 72  38 - 126 U/L Final  . Total Bilirubin 08/20/2015 0.7  0.3 - 1.2 mg/dL Final  . GFR calc non Af Amer 08/20/2015 >60  >60 mL/min Final  . GFR calc Af Amer 08/20/2015 >60  >60 mL/min Final   Comment: (NOTE) The eGFR has been calculated using the CKD EPI equation. This calculation has not been validated in all clinical situations. eGFR's persistently <60 mL/min signify possible Chronic Kidney Disease.   . Anion gap 08/20/2015 3* 5 - 15 Final  . WBC 08/20/2015 5.0  3.8 - 10.6 K/uL Final  . RBC 08/20/2015 4.61  4.40 - 5.90 MIL/uL Final  . Hemoglobin 08/20/2015 14.9  13.0 - 18.0 g/dL Final  . HCT  08/20/2015 42.9  40.0 - 52.0 % Final  . MCV 08/20/2015 92.9  80.0 - 100.0 fL Final  . MCH 08/20/2015 32.2  26.0 - 34.0 pg Final  . MCHC 08/20/2015 34.7  32.0 - 36.0 g/dL Final  . RDW 08/20/2015 13.7  11.5 - 14.5 % Final  . Platelets 08/20/2015 237  150 - 440 K/uL Final  . Neutrophils Relative % 08/20/2015 46  % Final  . Neutro Abs 08/20/2015 2.3  1.4 - 6.5 K/uL Final  . Lymphocytes Relative 08/20/2015 42  % Final  . Lymphs Abs 08/20/2015 2.1  1.0 - 3.6 K/uL Final  . Monocytes Relative 08/20/2015 9  % Final  . Monocytes Absolute 08/20/2015 0.5  0.2 - 1.0 K/uL Final  . Eosinophils Relative 08/20/2015 2  % Final  . Eosinophils Absolute 08/20/2015 0.1  0 - 0.7 K/uL Final  . Basophils Relative 08/20/2015  1  % Final  . Basophils Absolute 08/20/2015 0.0  0 - 0.1 K/uL Final  . Vitamin B-12 08/20/2015 416  180 - 914 pg/mL Final   Comment: (NOTE) This assay is not validated for testing neonatal or myeloproliferative syndrome specimens for Vitamin B12 levels. Performed at [facility]   . Folate 08/20/2015 8.7  >5.9 ng/mL Final  . Ferritin 08/20/2015 55  24 - 336 ng/mL Final    Assessment:  Andrew Peters is a 50 y.o. male old African American gentleman with stage IB gastric carcinoma.  He presented in 08/2011 with an 8 month history of epigastric pain, early satiety, anorexia, and hematemesis.  Upper endoscopy on 08/19/2011 revealed a large gastric antral ulcer.  Biopsies demonstrated chronic active gastritis with Helicobacter pylori as well as intramucosal adenocarcinoma.  PET scan did not reveal any disease outside of the stomach.    He underwent partial distal gastrectomy on 10/04/2011.  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  Margins were negative.  Nine lymph nodes were negative.  Pathologic stage was T2N0M0.  CEA has ranged between 4 and 5.  He developed hematochezia and LUQ abdominal pain.   Abdomen and pelvic CT scan  with contrast on 10/30/2014 revealed no evidence of bowel obstruction or inflammation.  There were multiple stable liver cysts and hemangiomas.  There was no evidence of residual tumor.  There was prostate enlargement with bladder wall thickening.  EGD on 11/13/2014 revealed a normal esophagus, gastric ulcer with a clean base and normal duodenum. Gastric biopsy revealed oxyntic mucosa with focal moderate chronic gastritis, negative for H pylori, dysplasia, and malignancy.  Colonoscopy on 11/13/2014 revealed a poor prep. There was a medium polyp in the descending colon. Pathology revealed a tubular adenoma with no high grade dysplasia or malignancy.  Repeat colonoscopy was recommended in 5 years.  Abdomen and pelvic CT scan on 08/31/2015 revealed no evidence of metastatic disease.  Symptomatically, he is feeling better.  He initially lost 10 pounds, but has gained back 2 pounds.  Exam is normal.  Plan: 1.  Patient to fill omeprazole prescription. 2.  Follow-up with GI on 09/21/2015. 3.  Follow-up with Dr. Netty Starring on 09/23/2015 3.  RTC in 3 months for MD assess and labs (CBC with diff, CMP).    Lequita Asal, MD  09/08/2015, 10:05 PM

## 2015-09-08 ENCOUNTER — Inpatient Hospital Stay: Payer: Medicaid Other | Attending: Hematology and Oncology | Admitting: Hematology and Oncology

## 2015-09-08 ENCOUNTER — Encounter (INDEPENDENT_AMBULATORY_CARE_PROVIDER_SITE_OTHER): Payer: Self-pay

## 2015-09-08 ENCOUNTER — Encounter: Payer: Self-pay | Admitting: Hematology and Oncology

## 2015-09-08 VITALS — BP 125/82 | HR 77 | Temp 96.9°F | Ht 67.0 in | Wt 153.8 lb

## 2015-09-08 DIAGNOSIS — B9681 Helicobacter pylori [H. pylori] as the cause of diseases classified elsewhere: Secondary | ICD-10-CM | POA: Diagnosis not present

## 2015-09-08 DIAGNOSIS — Z79899 Other long term (current) drug therapy: Secondary | ICD-10-CM

## 2015-09-08 DIAGNOSIS — Z903 Acquired absence of stomach [part of]: Secondary | ICD-10-CM

## 2015-09-08 DIAGNOSIS — C169 Malignant neoplasm of stomach, unspecified: Secondary | ICD-10-CM

## 2015-09-08 DIAGNOSIS — F17210 Nicotine dependence, cigarettes, uncomplicated: Secondary | ICD-10-CM | POA: Diagnosis not present

## 2015-09-08 DIAGNOSIS — M549 Dorsalgia, unspecified: Secondary | ICD-10-CM | POA: Diagnosis not present

## 2015-09-08 DIAGNOSIS — Z85028 Personal history of other malignant neoplasm of stomach: Secondary | ICD-10-CM | POA: Diagnosis present

## 2015-09-08 NOTE — Progress Notes (Signed)
Patient here for follow up. Complaints of back pain. Would like pain medication.

## 2015-09-10 ENCOUNTER — Other Ambulatory Visit: Payer: Self-pay | Admitting: *Deleted

## 2015-09-10 DIAGNOSIS — C169 Malignant neoplasm of stomach, unspecified: Secondary | ICD-10-CM

## 2015-12-10 ENCOUNTER — Other Ambulatory Visit: Payer: Self-pay | Admitting: *Deleted

## 2015-12-10 ENCOUNTER — Encounter: Payer: Self-pay | Admitting: Hematology and Oncology

## 2015-12-10 ENCOUNTER — Inpatient Hospital Stay (HOSPITAL_BASED_OUTPATIENT_CLINIC_OR_DEPARTMENT_OTHER): Payer: Medicaid Other | Admitting: Hematology and Oncology

## 2015-12-10 ENCOUNTER — Inpatient Hospital Stay: Payer: Medicaid Other | Attending: Hematology and Oncology

## 2015-12-10 VITALS — BP 126/85 | HR 79 | Temp 96.4°F | Resp 18 | Wt 166.9 lb

## 2015-12-10 DIAGNOSIS — Z79899 Other long term (current) drug therapy: Secondary | ICD-10-CM | POA: Insufficient documentation

## 2015-12-10 DIAGNOSIS — F17210 Nicotine dependence, cigarettes, uncomplicated: Secondary | ICD-10-CM | POA: Insufficient documentation

## 2015-12-10 DIAGNOSIS — Z903 Acquired absence of stomach [part of]: Secondary | ICD-10-CM

## 2015-12-10 DIAGNOSIS — C169 Malignant neoplasm of stomach, unspecified: Secondary | ICD-10-CM

## 2015-12-10 DIAGNOSIS — B9681 Helicobacter pylori [H. pylori] as the cause of diseases classified elsewhere: Secondary | ICD-10-CM

## 2015-12-10 DIAGNOSIS — Z85028 Personal history of other malignant neoplasm of stomach: Secondary | ICD-10-CM | POA: Diagnosis not present

## 2015-12-10 DIAGNOSIS — N400 Enlarged prostate without lower urinary tract symptoms: Secondary | ICD-10-CM | POA: Diagnosis not present

## 2015-12-10 LAB — COMPREHENSIVE METABOLIC PANEL
ALT: 14 U/L — ABNORMAL LOW (ref 17–63)
AST: 20 U/L (ref 15–41)
Albumin: 4.7 g/dL (ref 3.5–5.0)
Alkaline Phosphatase: 68 U/L (ref 38–126)
Anion gap: 3 — ABNORMAL LOW (ref 5–15)
BUN: 17 mg/dL (ref 6–20)
CO2: 28 mmol/L (ref 22–32)
Calcium: 9.2 mg/dL (ref 8.9–10.3)
Chloride: 107 mmol/L (ref 101–111)
Creatinine, Ser: 1.16 mg/dL (ref 0.61–1.24)
GFR calc Af Amer: >60 mL/min (ref >60)
GFR calc non Af Amer: >60 mL/min (ref >60)
Glucose, Bld: 94 mg/dL (ref 65–99)
Potassium: 4.3 mmol/L (ref 3.5–5.1)
Sodium: 138 mmol/L (ref 135–145)
Total Bilirubin: 1 mg/dL (ref 0.3–1.2)
Total Protein: 7.2 g/dL (ref 6.5–8.1)

## 2015-12-10 LAB — CBC WITH DIFFERENTIAL/PLATELET
Basophils Absolute: 0 10*3/uL (ref 0–0.1)
Basophils Relative: 1 %
Eosinophils Absolute: 0.1 10*3/uL (ref 0–0.7)
Eosinophils Relative: 2 %
HCT: 43.1 % (ref 40.0–52.0)
Hemoglobin: 14.9 g/dL (ref 13.0–18.0)
Lymphocytes Relative: 45 %
Lymphs Abs: 2.2 10*3/uL (ref 1.0–3.6)
MCH: 31.9 pg (ref 26.0–34.0)
MCHC: 34.6 g/dL (ref 32.0–36.0)
MCV: 92.2 fL (ref 80.0–100.0)
Monocytes Absolute: 0.5 10*3/uL (ref 0.2–1.0)
Monocytes Relative: 10 %
Neutro Abs: 2 10*3/uL (ref 1.4–6.5)
Neutrophils Relative %: 42 %
Platelets: 203 10*3/uL (ref 150–440)
RBC: 4.67 MIL/uL (ref 4.40–5.90)
RDW: 13.6 % (ref 11.5–14.5)
WBC: 4.7 10*3/uL (ref 3.8–10.6)

## 2015-12-10 LAB — FERRITIN: Ferritin: 51 ng/mL (ref 24–336)

## 2015-12-10 LAB — VITAMIN B12: Vitamin B-12: 333 pg/mL (ref 180–914)

## 2015-12-10 NOTE — Progress Notes (Signed)
[facility] day:  12/10/15   Chief Complaint: Andrew Peters is a 50 y.o. male with stage IB gastric cancer who is seen for 3 month assessment.  HPI: The patient was last seen in the medical oncology clinic on 09/08/2015.  At that time, he was feeling better.  He initially lost 10 pounds, but had gained back 2 pounds.  Exam was normal.  Abdomen and pelvic CT scan on 08/31/2015 revealed no evidence of metastatic disease.  An appointment was scheduled with GI at the [facility] on 09/21/2015.   He saw Dr. Loistine Simas on 09/21/2015.  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  Weight had initially dropped from 161 pounds to 151 pounds. His weight was up to 155 pounds at that visit. He denied any symptoms.  He was felt not need an upper endoscopy.  Symptomatically, he feels fine. He is eating okay. He is eating plenty of fruit. He is roller scating at night.  He notes that he may have a hernia.   Past Medical History:  Diagnosis Date  . Cancer [facility]) 2012   Gastrectomy    Past Surgical History:  Procedure Laterality Date  . COLONOSCOPY WITH PROPOFOL N/A 11/13/2014   Procedure: COLONOSCOPY WITH PROPOFOL;  Surgeon: Hulen Luster, MD;  Location: [facility] ENDOSCOPY;  Service: Gastroenterology;  Laterality: N/A;  . ESOPHAGOGASTRODUODENOSCOPY (EGD) WITH PROPOFOL N/A 11/13/2014   Procedure: ESOPHAGOGASTRODUODENOSCOPY (EGD) WITH PROPOFOL;  Surgeon: Hulen Luster, MD;  Location: [facility] ENDOSCOPY;  Service: Gastroenterology;  Laterality: N/A;  . 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    History reviewed. No pertinent family history.  Social History:  reports that he has been smoking Cigarettes.  He has been smoking about 0.25 packs per day. He does not have any smokeless tobacco history on file. He reports that he drinks alcohol. He reports that he does not use drugs.  He works  at the [employer].  He loves to roller skate.  He smokes 5 cigarettes a week. The patient is alone today.  Allergies: No Known Allergies  Current Medications: Current Outpatient Prescriptions  Medication Sig Dispense Refill  . buPROPion (WELLBUTRIN XL) 150 MG 24 hr tablet Take by mouth.    . cyclobenzaprine (FLEXERIL) 10 MG tablet Take by mouth.    . meloxicam (MOBIC) 7.5 MG tablet Take by mouth.    . Vitamin D, Ergocalciferol, (DRISDOL) 50000 units CAPS capsule Take by mouth.     No current facility-administered medications for this visit.    Review of Systems:  GENERAL:  Feels good.  No fevers or sweats.  Weight gain of 13 pounds. PERFORMANCE STATUS (ECOG):  0 HEENT:  No visual changes, runny nose, sore throat, mouth sores or tenderness. Lungs: No shortness of breath or cough.  No hemoptysis. Cardiac:  No chest pain, palpitations, orthopnea, or PND. GI:  No abdominal pain.  Eating well.  No vomiting, melena or hematochezia. GU:  No urgency, frequency, dysuria, or hematuria.  May have a hernia. Musculoskeletal:  No back pain.  Shoulder popping.  No muscle tenderness. Extremities:  No pain or swelling. Skin:  No rashes or skin changes. Neuro:  No headache, numbness or weakness, balance or coordination issues. Endocrine:  No diabetes, thyroid issues, hot flashes or night sweats. Psych:  No mood changes, depression or anxiety. Pain:  No pain.  Review of systems:  All other systems reviewed and found to be negative.  Physical Exam: Blood pressure 126/85, pulse 79, temperature (!) 96.4 F (35.8 C), temperature source Tympanic, resp. rate 18, weight 166 lb 14.2 oz (75.7 kg). GENERAL:  Well developed, well nourished, gentleman sitting comfortably in the exam room in no acute distress. MENTAL STATUS:  Alert and oriented to person, place and time. HEAD:  Short gray hair.  Normocephalic, atraumatic, face symmetric, no Cushingoid features. EYES:  Brown eyes.  Pupils equal round and  reactive to light and accomodation.  No conjunctivitis or scleral icterus. ENT:  Oropharynx clear without lesion.  Missing front tooth.  Tongue normal. Mucous membranes moist.  RESPIRATORY:  Clear to auscultation without rales, wheezes or rhonchi. CARDIOVASCULAR:  Regular rate and rhythm without murmur, rub or gallop. ABDOMEN:  Soft, non-tender with active bowel sounds, and no hepatosplenomegaly.  No masses. SKIN:  No rashes, ulcers or lesions. EXTREMITIES: No edema, no skin discoloration or tenderness.  No palpable cords. LYMPH NODES: No palpable cervical, supraclavicular, axillary or inguinal adenopathy  NEUROLOGICAL: Unremarkable. PSYCH:  Appropriate.   Appointment on 12/10/2015  Component Date Value Ref Range Status  . WBC 12/10/2015 4.7  3.8 - 10.6 K/uL Final  . RBC 12/10/2015 4.67  4.40 - 5.90 MIL/uL Final  . Hemoglobin 12/10/2015 14.9  13.0 - 18.0 g/dL Final  . HCT 12/10/2015 43.1  40.0 - 52.0 % Final  . MCV 12/10/2015 92.2  80.0 - 100.0 fL Final  . MCH 12/10/2015 31.9  26.0 - 34.0 pg Final  . MCHC 12/10/2015 34.6  32.0 - 36.0 g/dL Final  . RDW 12/10/2015 13.6  11.5 - 14.5 % Final  . Platelets 12/10/2015 203  150 - 440 K/uL Final  . Neutrophils Relative % 12/10/2015 42  % Final  . Neutro Abs 12/10/2015 2.0  1.4 - 6.5 K/uL Final  . Lymphocytes Relative 12/10/2015 45  % Final  . Lymphs Abs 12/10/2015 2.2  1.0 - 3.6 K/uL Final  . Monocytes Relative 12/10/2015 10  % Final  . Monocytes Absolute 12/10/2015 0.5  0.2 - 1.0 K/uL Final  . Eosinophils Relative 12/10/2015 2  % Final  . Eosinophils Absolute 12/10/2015 0.1  0 - 0.7 K/uL Final  . Basophils Relative 12/10/2015 1  % Final  . Basophils Absolute 12/10/2015 0.0  0 - 0.1 K/uL Final  . Sodium 12/10/2015 138  135 - 145 mmol/L Final  . Potassium 12/10/2015 4.3  3.5 - 5.1 mmol/L Final  . Chloride 12/10/2015 107  101 - 111 mmol/L Final  . CO2 12/10/2015 28  22 - 32 mmol/L Final  . Glucose, Bld 12/10/2015 94  65 - 99 mg/dL Final  .  BUN 12/10/2015 17  6 - 20 mg/dL Final  . Creatinine, Ser 12/10/2015 1.16  0.61 - 1.24 mg/dL Final  . Calcium 12/10/2015 9.2  8.9 - 10.3 mg/dL Final  . Total Protein 12/10/2015 7.2  6.5 - 8.1 g/dL Final  . Albumin 12/10/2015 4.7  3.5 - 5.0 g/dL Final  . AST 12/10/2015 20  15 - 41 U/L Final  . ALT 12/10/2015 14* 17 - 63 U/L Final  . Alkaline Phosphatase 12/10/2015 68  38 - 126 U/L Final  . Total Bilirubin 12/10/2015 1.0  0.3 - 1.2 mg/dL Final  . GFR calc non Af Amer 12/10/2015 >60  >60 mL/min Final  . GFR calc Af Amer 12/10/2015 >60  >60 mL/min Final   Comment: (NOTE) The eGFR has been calculated using  the CKD EPI equation. This calculation has not been validated in all clinical situations. eGFR's persistently <60 mL/min signify possible Chronic Kidney Disease.   Georgiann Hahn gap 12/10/2015 3* 5 - 15 Final    Assessment:  Andrew Peters is a 50 y.o. male old African American gentleman with stage IB gastric carcinoma.  He presented in 08/2011 with an 8 month history of epigastric pain, early satiety, anorexia, and hematemesis.  Upper endoscopy on 08/19/2011 revealed a large gastric antral ulcer.  Biopsies demonstrated chronic active gastritis with Helicobacter pylori as well as intramucosal adenocarcinoma.  PET scan did not reveal any disease outside of the stomach.    He underwent partial distal gastrectomy on 10/04/2011.  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  Margins were negative.  Nine lymph nodes were negative.  Pathologic stage was T2N0M0.  CEA has ranged between 4 and 5.  He developed hematochezia and LUQ abdominal pain.   Abdomen and pelvic CT scan with contrast on 10/30/2014 revealed no evidence of bowel obstruction or inflammation.  There were multiple stable liver cysts and hemangiomas.  There was no evidence of residual tumor.  There was prostate enlargement with bladder wall thickening.  EGD on 11/13/2014 revealed a  normal esophagus, gastric ulcer with a clean base and normal duodenum. Gastric biopsy revealed oxyntic mucosa with focal moderate chronic gastritis, negative for H pylori, dysplasia, and malignancy.  Colonoscopy on 11/13/2014 revealed a poor prep. There was a medium polyp in the descending colon. Pathology revealed a tubular adenoma with no high grade dysplasia or malignancy.  Repeat colonoscopy was recommended in 5 years.  Abdomen and pelvic CT scan on 08/31/2015 revealed no evidence of metastatic disease.  Symptomatically, he feels good.  He has gained weight.  He notes a possible hernia.  Exam is normal.  Ferritin was 51 and B12 333 (180-914).  Plan: 1.  Labs today:  CBC with diff, CMP, ferritin, B12. 2.  Review interval GI appointment. 3.  Discuss follow-up every 6-12 months for years 3-5 then annually.  EGD and CT scans will be performed as indicated.  We discussed monitoring for B12 and iron deficiency given gastric resection. 4.  Discuss follow-up with Dr. Netty Starring for any hernia concerns.  Discuss hernia issues which require immediate attention. 5.  RTC in 3 months for MD assessment and labs (CBC with diff, CMP, ferrtin, B12).    Lequita Asal, MD  12/10/2015, 10:53 AM

## 2015-12-10 NOTE — Progress Notes (Signed)
Patient states he is having stabbing pain in his groin at times 10/10.  States it happens sporadically.  He has used muscle relaxers and ice for relief.  States it is so bad at times that he cries.  Has not noticed any swelling.  No problems urinating or having bowel movements.

## 2015-12-12 ENCOUNTER — Encounter: Payer: Self-pay | Admitting: Hematology and Oncology

## 2016-06-09 ENCOUNTER — Inpatient Hospital Stay: Payer: Medicaid Other | Admitting: Hematology and Oncology

## 2016-06-09 ENCOUNTER — Inpatient Hospital Stay: Payer: Medicaid Other

## 2016-06-09 NOTE — Progress Notes (Deleted)
[facility] day:  06/09/16   Chief Complaint: Andrew Peters is a 51 y.o. male with stage IB gastric cancer who is seen for 6 month assessment.  HPI: The patient was last seen in the medical oncology clinic on 12/10/2015.  At that time, he was felt fine.  Exam was unremarkable.  CBC and CMP were normal.  Ferritin was 51 and B12 333.  We discussed follow-up with Dr. Netty Starring regarding hernia concerns.  During the interim,   Past Medical History:  Diagnosis Date  . Cancer [facility]) 2012   Gastrectomy    Past Surgical History:  Procedure Laterality Date  . COLONOSCOPY WITH PROPOFOL N/A 11/13/2014   Procedure: COLONOSCOPY WITH PROPOFOL;  Surgeon: Hulen Luster, MD;  Location: [facility] ENDOSCOPY;  Service: Gastroenterology;  Laterality: N/A;  . ESOPHAGOGASTRODUODENOSCOPY (EGD) WITH PROPOFOL N/A 11/13/2014   Procedure: ESOPHAGOGASTRODUODENOSCOPY (EGD) WITH PROPOFOL;  Surgeon: Hulen Luster, MD;  Location: [facility] ENDOSCOPY;  Service: Gastroenterology;  Laterality: N/A;  . 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    No family history on file.  Social History:  reports that he has been smoking Cigarettes.  He has been smoking about 0.25 packs per day. He does not have any smokeless tobacco history on file. He reports that he drinks alcohol. He reports that he does not use drugs.  He works at the [employer].  He loves to roller skate.  He smokes 5 cigarettes a week. The patient is alone today.  Allergies: No Known Allergies  Current Medications: Current Outpatient Prescriptions  Medication Sig Dispense Refill  . buPROPion (WELLBUTRIN XL) 150 MG 24 hr tablet Take by mouth.    . cyclobenzaprine (FLEXERIL) 10 MG tablet Take by mouth.    . meloxicam (MOBIC) 7.5 MG tablet Take by mouth.    . Vitamin D, Ergocalciferol, (DRISDOL) 50000 units CAPS capsule Take by mouth.     No current facility-administered medications for  this visit.    Review of Systems:  GENERAL:  Feels good.  No fevers or sweats.  Weight gain of 13 pounds. PERFORMANCE STATUS (ECOG):  0 HEENT:  No visual changes, runny nose, sore throat, mouth sores or tenderness. Lungs: No shortness of breath or cough.  No hemoptysis. Cardiac:  No chest pain, palpitations, orthopnea, or PND. GI:  No abdominal pain.  Eating well.  No vomiting, melena or hematochezia. GU:  No urgency, frequency, dysuria, or hematuria.  May have a hernia. Musculoskeletal:  No back pain.  Shoulder popping.  No muscle tenderness. Extremities:  No pain or swelling. Skin:  No rashes or skin changes. Neuro:  No headache, numbness or weakness, balance or coordination issues. Endocrine:  No diabetes, thyroid issues, hot flashes or night sweats. Psych:  No mood changes, depression or anxiety. Pain:  No pain. Review of systems:  All other systems reviewed and found to be negative.  Physical Exam: There were no vitals taken for this visit. GENERAL:  Well developed, well nourished, gentleman sitting comfortably in the exam room in no acute distress. MENTAL STATUS:  Alert and oriented to person, place and time. HEAD:  Short gray hair.  Normocephalic, atraumatic, face symmetric, no Cushingoid features. EYES:  Brown eyes.  Pupils equal round and reactive to light and accomodation.  No conjunctivitis or scleral icterus. ENT:  Oropharynx clear without lesion.  Missing front tooth.  Tongue normal. Mucous membranes moist.  RESPIRATORY:  Clear to auscultation without rales, wheezes or rhonchi. CARDIOVASCULAR:  Regular rate and rhythm without murmur, rub or gallop. ABDOMEN:  Soft, non-tender with active bowel sounds, and no hepatosplenomegaly.  No masses. SKIN:  No rashes, ulcers or lesions. EXTREMITIES: No edema, no skin discoloration or tenderness.  No palpable cords. LYMPH NODES: No palpable cervical, supraclavicular, axillary or inguinal adenopathy  NEUROLOGICAL: Unremarkable. PSYCH:   Appropriate.   No visits with results within 3 Day(s) from this visit.  Latest known visit with results is:  Appointment on 12/10/2015  Component Date Value Ref Range Status  . WBC 12/10/2015 4.7  3.8 - 10.6 K/uL Final  . RBC 12/10/2015 4.67  4.40 - 5.90 MIL/uL Final  . Hemoglobin 12/10/2015 14.9  13.0 - 18.0 g/dL Final  . HCT 12/10/2015 43.1  40.0 - 52.0 % Final  . MCV 12/10/2015 92.2  80.0 - 100.0 fL Final  . MCH 12/10/2015 31.9  26.0 - 34.0 pg Final  . MCHC 12/10/2015 34.6  32.0 - 36.0 g/dL Final  . RDW 12/10/2015 13.6  11.5 - 14.5 % Final  . Platelets 12/10/2015 203  150 - 440 K/uL Final  . Neutrophils Relative % 12/10/2015 42  % Final  . Neutro Abs 12/10/2015 2.0  1.4 - 6.5 K/uL Final  . Lymphocytes Relative 12/10/2015 45  % Final  . Lymphs Abs 12/10/2015 2.2  1.0 - 3.6 K/uL Final  . Monocytes Relative 12/10/2015 10  % Final  . Monocytes Absolute 12/10/2015 0.5  0.2 - 1.0 K/uL Final  . Eosinophils Relative 12/10/2015 2  % Final  . Eosinophils Absolute 12/10/2015 0.1  0 - 0.7 K/uL Final  . Basophils Relative 12/10/2015 1  % Final  . Basophils Absolute 12/10/2015 0.0  0 - 0.1 K/uL Final  . Sodium 12/10/2015 138  135 - 145 mmol/L Final  . Potassium 12/10/2015 4.3  3.5 - 5.1 mmol/L Final  . Chloride 12/10/2015 107  101 - 111 mmol/L Final  . CO2 12/10/2015 28  22 - 32 mmol/L Final  . Glucose, Bld 12/10/2015 94  65 - 99 mg/dL Final  . BUN 12/10/2015 17  6 - 20 mg/dL Final  . Creatinine, Ser 12/10/2015 1.16  0.61 - 1.24 mg/dL Final  . Calcium 12/10/2015 9.2  8.9 - 10.3 mg/dL Final  . Total Protein 12/10/2015 7.2  6.5 - 8.1 g/dL Final  . Albumin 12/10/2015 4.7  3.5 - 5.0 g/dL Final  . AST 12/10/2015 20  15 - 41 U/L Final  . ALT 12/10/2015 14* 17 - 63 U/L Final  . Alkaline Phosphatase 12/10/2015 68  38 - 126 U/L Final  . Total Bilirubin 12/10/2015 1.0  0.3 - 1.2 mg/dL Final  . GFR calc non Af Amer 12/10/2015 >60  >60 mL/min Final  . GFR calc Af Amer 12/10/2015 >60  >60 mL/min  Final   Comment: (NOTE) The eGFR has been calculated using the CKD EPI equation. This calculation has not been validated in all clinical situations. eGFR's persistently <60 mL/min signify possible Chronic Kidney Disease.   . Anion gap 12/10/2015 3* 5 - 15 Final  . Vitamin B-12 12/10/2015 333  180 - 914 pg/mL Final   Comment: (NOTE) This assay is not validated for testing neonatal or myeloproliferative syndrome specimens for Vitamin B12 levels. Performed at [facility]   . Ferritin 12/10/2015 51  24 - 336 ng/mL Final    Assessment:  Kalvin Buss is a 51 y.o. male old African American gentleman with stage IB  gastric carcinoma.  He presented in 08/2011 with an 8 month history of epigastric pain, early satiety, anorexia, and hematemesis.  Upper endoscopy on 08/19/2011 revealed a large gastric antral ulcer.  Biopsies demonstrated chronic active gastritis with Helicobacter pylori as well as intramucosal adenocarcinoma.  PET scan did not reveal any disease outside of the stomach.    He underwent partial distal gastrectomy on 10/04/2011.  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  Margins were negative.  Nine lymph nodes were negative.  Pathologic stage was T2N0M0.  CEA has ranged between 4 and 5.  He developed hematochezia and LUQ abdominal pain.   Abdomen and pelvic CT with contrast on 10/30/2014 revealed no evidence of bowel obstruction or inflammation.  There were multiple stable liver cysts and hemangiomas.  There was no evidence of residual tumor.  There was prostate enlargement with bladder wall thickening.  EGD on 11/13/2014 revealed a normal esophagus, gastric ulcer with a clean base and normal duodenum. Gastric biopsy revealed oxyntic mucosa with focal moderate chronic gastritis, negative for H pylori, dysplasia, and malignancy.  Colonoscopy on 11/13/2014 revealed a poor prep. There was a medium polyp in the descending  colon. Pathology revealed a tubular adenoma with no high grade dysplasia or malignancy.  Repeat colonoscopy was recommended in 5 years.  Abdomen and pelvic CT scan on 08/31/2015 revealed no evidence of metastatic disease.  Symptomatically, he feels good.  He has gained weight.  He notes a possible hernia.  Exam is normal.  Ferritin was 51 and B12 333 (180-914).  Plan: 1.  Labs today:  CBC with diff, CMP, ferritin, B12.  2.  Review interval GI appointment. 3.  Discuss follow-up every 6-12 months for years 3-5 then annually.  EGD and CT scans will be performed as indicated.  We discussed monitoring for B12 and iron deficiency given gastric resection. 4.  Discuss follow-up with Dr. Netty Starring for any hernia concerns.  Discuss hernia issues which require immediate attention. 5.  RTC in 3 months for MD assessment and labs (CBC with diff, CMP, ferrtin, B12).    Lequita Asal, MD  06/09/2016, 4:15 AM

## 2016-06-23 ENCOUNTER — Inpatient Hospital Stay: Payer: Medicaid Other | Attending: Hematology and Oncology

## 2016-06-23 ENCOUNTER — Inpatient Hospital Stay (HOSPITAL_BASED_OUTPATIENT_CLINIC_OR_DEPARTMENT_OTHER): Payer: Medicaid Other | Admitting: Hematology and Oncology

## 2016-06-23 ENCOUNTER — Encounter: Payer: Self-pay | Admitting: Hematology and Oncology

## 2016-06-23 VITALS — BP 110/70 | HR 73 | Temp 97.5°F | Resp 14 | Wt 165.4 lb

## 2016-06-23 DIAGNOSIS — Z903 Acquired absence of stomach [part of]: Secondary | ICD-10-CM

## 2016-06-23 DIAGNOSIS — F17210 Nicotine dependence, cigarettes, uncomplicated: Secondary | ICD-10-CM | POA: Insufficient documentation

## 2016-06-23 DIAGNOSIS — C169 Malignant neoplasm of stomach, unspecified: Secondary | ICD-10-CM

## 2016-06-23 DIAGNOSIS — Z85028 Personal history of other malignant neoplasm of stomach: Secondary | ICD-10-CM

## 2016-06-23 LAB — FERRITIN: Ferritin: 40 ng/mL (ref 24–336)

## 2016-06-23 LAB — COMPREHENSIVE METABOLIC PANEL
ALT: 19 U/L (ref 17–63)
AST: 21 U/L (ref 15–41)
Albumin: 4.6 g/dL (ref 3.5–5.0)
Alkaline Phosphatase: 57 U/L (ref 38–126)
Anion gap: 4 — ABNORMAL LOW (ref 5–15)
BUN: 20 mg/dL (ref 6–20)
CO2: 25 mmol/L (ref 22–32)
Calcium: 9.5 mg/dL (ref 8.9–10.3)
Chloride: 106 mmol/L (ref 101–111)
Creatinine, Ser: 1.24 mg/dL (ref 0.61–1.24)
GFR calc Af Amer: >60 mL/min (ref >60)
GFR calc non Af Amer: >60 mL/min (ref >60)
Glucose, Bld: 104 mg/dL — ABNORMAL HIGH (ref 65–99)
Potassium: 4.1 mmol/L (ref 3.5–5.1)
Sodium: 135 mmol/L (ref 135–145)
Total Bilirubin: 1 mg/dL (ref 0.3–1.2)
Total Protein: 7 g/dL (ref 6.5–8.1)

## 2016-06-23 LAB — CBC WITH DIFFERENTIAL/PLATELET
Basophils Absolute: 0.1 10*3/uL (ref 0–0.1)
Basophils Relative: 1 %
Eosinophils Absolute: 0.2 10*3/uL (ref 0–0.7)
Eosinophils Relative: 3 %
HCT: 43.3 % (ref 40.0–52.0)
Hemoglobin: 15.3 g/dL (ref 13.0–18.0)
Lymphocytes Relative: 41 %
Lymphs Abs: 2.3 10*3/uL (ref 1.0–3.6)
MCH: 32.9 pg (ref 26.0–34.0)
MCHC: 35.3 g/dL (ref 32.0–36.0)
MCV: 93.3 fL (ref 80.0–100.0)
Monocytes Absolute: 0.5 10*3/uL (ref 0.2–1.0)
Monocytes Relative: 9 %
Neutro Abs: 2.5 10*3/uL (ref 1.4–6.5)
Neutrophils Relative %: 46 %
Platelets: 224 10*3/uL (ref 150–440)
RBC: 4.64 MIL/uL (ref 4.40–5.90)
RDW: 13.6 % (ref 11.5–14.5)
WBC: 5.5 10*3/uL (ref 3.8–10.6)

## 2016-06-23 LAB — VITAMIN B12: Vitamin B-12: 414 pg/mL (ref 180–914)

## 2016-06-23 NOTE — Progress Notes (Signed)
[facility] day:  06/23/16   Chief Complaint: Andrew Peters is a 51 y.o. male with stage IB gastric cancer who is seen for 6 month assessment.  HPI: The patient was last seen in the medical oncology clinic on 12/10/2015.  At that time, he was felt fine.  Exam was unremarkable.  CBC and CMP were normal.  Ferritin was 51 and B12 333.  We discussed follow-up with Dr. Netty Starring regarding hernia concerns.  During the interim, he denies any complaints.  He denies any abdominal symptoms.  He has not had his hernia checked.  He notes that his cousin was killed.  His niece is getting married in [place].   Past Medical History:  Diagnosis Date  . Cancer [facility]) 2012   Gastrectomy    Past Surgical History:  Procedure Laterality Date  . COLONOSCOPY WITH PROPOFOL N/A 11/13/2014   Procedure: COLONOSCOPY WITH PROPOFOL;  Surgeon: Hulen Luster, MD;  Location: [facility] ENDOSCOPY;  Service: Gastroenterology;  Laterality: N/A;  . ESOPHAGOGASTRODUODENOSCOPY (EGD) WITH PROPOFOL N/A 11/13/2014   Procedure: ESOPHAGOGASTRODUODENOSCOPY (EGD) WITH PROPOFOL;  Surgeon: Hulen Luster, MD;  Location: [facility] ENDOSCOPY;  Service: Gastroenterology;  Laterality: N/A;  . 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    History reviewed. No pertinent family history.  Social History:  reports that he has been smoking Cigarettes.  He has been smoking about 0.25 packs per day. He does not have any smokeless tobacco history on file. He reports that he drinks alcohol. He reports that he does not use drugs.  He works at the [employer].  He loves to roller skate.  He smokes 5 cigarettes a week.  His cousin was recently killed.  His niece is getting married in [place].  The patient is alone today.  Allergies: No Known Allergies  Current Medications: No current outpatient prescriptions on file.   No current facility-administered medications for this  visit.    Review of Systems:  GENERAL:  Feels good.  No fevers or sweats.  Weight down 1 pound. PERFORMANCE STATUS (ECOG):  0 HEENT:  No visual changes, runny nose, sore throat, mouth sores or tenderness. Lungs: No shortness of breath or cough.  No hemoptysis. Cardiac:  No chest pain, palpitations, orthopnea, or PND. GI:  No abdominal pain.  Eating well.  No vomiting, melena or hematochezia. GU:  No urgency, frequency, dysuria, or hematuria.  May have a hernia. Musculoskeletal:  No back pain.  Shoulder popping.  No muscle tenderness. Extremities:  No pain or swelling. Skin:  No rashes or skin changes. Neuro:  No headache, numbness or weakness, balance or coordination issues. Endocrine:  No diabetes, thyroid issues, hot flashes or night sweats. Psych:  No mood changes, depression or anxiety. Pain:  No pain. Review of systems:  All other systems reviewed and found to be negative.  Physical Exam: Blood pressure 110/70, pulse 73, temperature 97.5 F (36.4 C), temperature source Tympanic, resp. rate 14, weight 165 lb 6 oz (75 kg). GENERAL:  Well developed, well nourished, gentleman sitting comfortably in the exam room in no acute distress. MENTAL STATUS:  Alert and oriented to person, place and time. HEAD:  Short gray hair.  Goatee.  Normocephalic, atraumatic, face symmetric, no Cushingoid features. EYES:  Brown eyes.  Pupils equal round and reactive to light and accomodation.  No conjunctivitis or scleral icterus. ENT:  Oropharynx clear without lesion.  Missing front tooth.  Tongue normal. Mucous membranes moist.  RESPIRATORY:  Clear to auscultation without rales, wheezes or rhonchi. CARDIOVASCULAR:  Regular rate and rhythm without murmur, rub or gallop. ABDOMEN:  Soft, non-tender with active bowel sounds, and no hepatosplenomegaly.  No masses. SKIN:  No rashes, ulcers or lesions. EXTREMITIES: No edema, no skin discoloration or tenderness.  No palpable cords. LYMPH NODES: No palpable  cervical, supraclavicular, axillary or inguinal adenopathy  NEUROLOGICAL: Unremarkable. PSYCH:  Appropriate.   Appointment on 06/23/2016  Component Date Value Ref Range Status  . WBC 06/23/2016 5.5  3.8 - 10.6 K/uL Final  . RBC 06/23/2016 4.64  4.40 - 5.90 MIL/uL Final  . Hemoglobin 06/23/2016 15.3  13.0 - 18.0 g/dL Final  . HCT 06/23/2016 43.3  40.0 - 52.0 % Final  . MCV 06/23/2016 93.3  80.0 - 100.0 fL Final  . MCH 06/23/2016 32.9  26.0 - 34.0 pg Final  . MCHC 06/23/2016 35.3  32.0 - 36.0 g/dL Final  . RDW 06/23/2016 13.6  11.5 - 14.5 % Final  . Platelets 06/23/2016 224  150 - 440 K/uL Final  . Neutrophils Relative % 06/23/2016 46  % Final  . Neutro Abs 06/23/2016 2.5  1.4 - 6.5 K/uL Final  . Lymphocytes Relative 06/23/2016 41  % Final  . Lymphs Abs 06/23/2016 2.3  1.0 - 3.6 K/uL Final  . Monocytes Relative 06/23/2016 9  % Final  . Monocytes Absolute 06/23/2016 0.5  0.2 - 1.0 K/uL Final  . Eosinophils Relative 06/23/2016 3  % Final  . Eosinophils Absolute 06/23/2016 0.2  0 - 0.7 K/uL Final  . Basophils Relative 06/23/2016 1  % Final  . Basophils Absolute 06/23/2016 0.1  0 - 0.1 K/uL Final  . Sodium 06/23/2016 135  135 - 145 mmol/L Final  . Potassium 06/23/2016 4.1  3.5 - 5.1 mmol/L Final  . Chloride 06/23/2016 106  101 - 111 mmol/L Final  . CO2 06/23/2016 25  22 - 32 mmol/L Final  . Glucose, Bld 06/23/2016 104* 65 - 99 mg/dL Final  . BUN 06/23/2016 20  6 - 20 mg/dL Final  . Creatinine, Ser 06/23/2016 1.24  0.61 - 1.24 mg/dL Final  . Calcium 06/23/2016 9.5  8.9 - 10.3 mg/dL Final  . Total Protein 06/23/2016 7.0  6.5 - 8.1 g/dL Final  . Albumin 06/23/2016 4.6  3.5 - 5.0 g/dL Final  . AST 06/23/2016 21  15 - 41 U/L Final  . ALT 06/23/2016 19  17 - 63 U/L Final  . Alkaline Phosphatase 06/23/2016 57  38 - 126 U/L Final  . Total Bilirubin 06/23/2016 1.0  0.3 - 1.2 mg/dL Final  . GFR calc non Af Amer 06/23/2016 >60  >60 mL/min Final  . GFR calc Af Amer 06/23/2016 >60  >60 mL/min  Final   Comment: (NOTE) The eGFR has been calculated using the CKD EPI equation. This calculation has not been validated in all clinical situations. eGFR's persistently <60 mL/min signify possible Chronic Kidney Disease.   Georgiann Hahn gap 06/23/2016 4* 5 - 15 Final    Assessment:  Andrew Peters is a 51 y.o. male old African American gentleman with stage IB gastric carcinoma.  He presented in 08/2011 with an 8 month history of epigastric pain, early satiety, anorexia, and hematemesis.  Upper endoscopy on 08/19/2011 revealed a large gastric antral ulcer.  Biopsies demonstrated chronic active gastritis with Helicobacter pylori as well as intramucosal adenocarcinoma.  PET scan did not reveal any disease outside of  the stomach.    He underwent partial distal gastrectomy on 10/04/2011.  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  Margins were negative.  Nine lymph nodes were negative.  Pathologic stage was T2N0M0.  CEA has ranged between 4 and 5.  He developed hematochezia and LUQ abdominal pain.   Abdomen and pelvic CT with contrast on 10/30/2014 revealed no evidence of bowel obstruction or inflammation.  There were multiple stable liver cysts and hemangiomas.  There was no evidence of residual tumor.  There was prostate enlargement with bladder wall thickening.  EGD on 11/13/2014 revealed a normal esophagus, gastric ulcer with a clean base and normal duodenum. Gastric biopsy revealed oxyntic mucosa with focal moderate chronic gastritis, negative for H pylori, dysplasia, and malignancy.  Colonoscopy on 11/13/2014 revealed a poor prep. There was a medium polyp in the descending colon. Pathology revealed a tubular adenoma with no high grade dysplasia or malignancy.  Repeat colonoscopy was recommended in 5 years.  Abdomen and pelvic CT scan on 08/31/2015 revealed no evidence of metastatic disease.  Symptomatically, he feels good.  He notes a  possible hernia.  Exam is normal.  Ferritin is 40 and B12 414 (180-914).  Plan: 1.  Labs today:  CBC with diff, CMP, ferritin, B12. 2.  Continue follow-up every 6-12 months for years 3-5 then annually.  EGD and CT scans will be performed as indicated.  We discussed monitoring for B12 and iron deficiency given gastric resection. 3.  Discuss follow-up with Dr. Netty Starring for any hernia concerns.  Discuss hernia issues which require immediate attention. 4.  RTC in 6 months for MD assessment and labs (CBC with diff, CMP, ferrtin, B12).    Lequita Asal, MD  06/23/2016, 4:02 PM

## 2016-06-23 NOTE — Progress Notes (Signed)
Patient here today for follow up.   

## 2016-09-23 ENCOUNTER — Encounter: Payer: Self-pay | Admitting: Hematology and Oncology

## 2016-12-27 ENCOUNTER — Telehealth: Payer: Self-pay | Admitting: *Deleted

## 2016-12-27 ENCOUNTER — Inpatient Hospital Stay: Payer: Medicaid Other

## 2016-12-27 ENCOUNTER — Inpatient Hospital Stay: Payer: Medicaid Other | Admitting: Hematology and Oncology

## 2016-12-27 NOTE — Telephone Encounter (Signed)
Patient called in regards to his appt today 12/27/16  He stated that he had forgotten all about his appt. And wanted to Rsched. Appt was Rsched. For 01/20/17. Patient is aware and copy of schedule was mailed out.

## 2016-12-27 NOTE — Progress Notes (Deleted)
[place] [facility] day:  12/27/16   Chief Complaint: Andrew Peters is a 51 y.o. male with stage IB gastric cancer who is seen for 6 month assessment.  HPI: The patient was last seen in the medical oncology clinic on  06/23/2016.  At that time, he felt good.  He noted a possible hernia.  Exam was normal.  Ferritin was 40 and B12 was 414 (180-914).  During the interim,   Past Medical History:  Diagnosis Date  . Cancer [facility]) 2012   Gastrectomy    Past Surgical History:  Procedure Laterality Date  . COLONOSCOPY WITH PROPOFOL N/A 11/13/2014   Performed by Hulen Luster, MD at [place]  . ESOPHAGOGASTRODUODENOSCOPY (EGD) WITH PROPOFOL N/A 11/13/2014   Performed by Hulen Luster, MD at [place]  . 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    No family history on file.  Social History:  reports that he has been smoking cigarettes.  He has been smoking about 0.25 packs per day. he has never used smokeless tobacco. He reports that he drinks alcohol. He reports that he does not use drugs.  He works at the [employer].  He loves to roller skate.  He smokes 5 cigarettes a week.  His cousin was recently killed.  His niece is getting married in [place].  The patient is alone today.  Allergies: No Known Allergies  Current Medications: No current outpatient medications on file.   No current facility-administered medications for this visit.    Review of Systems:  GENERAL:  Feels good.  No fevers or sweats.  Weight down 1 pound. PERFORMANCE STATUS (ECOG):  0 HEENT:  No visual changes, runny nose, sore throat, mouth sores or tenderness. Lungs: No shortness of breath or cough.  No hemoptysis. Cardiac:  No chest pain, palpitations, orthopnea, or PND. GI:  No abdominal pain.  Eating well.  No vomiting, melena or hematochezia. GU:  No urgency, frequency, dysuria, or hematuria.  May have a  hernia. Musculoskeletal:  No back pain.  Shoulder popping.  No muscle tenderness. Extremities:  No pain or swelling. Skin:  No rashes or skin changes. Neuro:  No headache, numbness or weakness, balance or coordination issues. Endocrine:  No diabetes, thyroid issues, hot flashes or night sweats. Psych:  No mood changes, depression or anxiety. Pain:  No pain. Review of systems:  All other systems reviewed and found to be negative.  Physical Exam: There were no vitals taken for this visit. GENERAL:  Well developed, well nourished, gentleman sitting comfortably in the exam room in no acute distress. MENTAL STATUS:  Alert and oriented to person, place and time. HEAD:  Short gray hair.  Goatee.  Normocephalic, atraumatic, face symmetric, no Cushingoid features. EYES:  Brown eyes.  Pupils equal round and reactive to light and accomodation.  No conjunctivitis or scleral icterus. ENT:  Oropharynx clear without lesion.  Missing front tooth.  Tongue normal. Mucous membranes moist.  RESPIRATORY:  Clear to auscultation without rales, wheezes or rhonchi. CARDIOVASCULAR:  Regular rate and rhythm without murmur, rub or gallop. ABDOMEN:  Soft, non-tender with active bowel sounds, and no hepatosplenomegaly.  No masses. SKIN:  No rashes, ulcers or lesions. EXTREMITIES: No edema, no skin discoloration or tenderness.  No palpable cords. LYMPH NODES: No palpable cervical, supraclavicular, axillary or inguinal adenopathy  NEUROLOGICAL: Unremarkable. PSYCH:  Appropriate.   No visits with  results within 3 Day(s) from this visit.  Latest known visit with results is:  Appointment on 06/23/2016  Component Date Value Ref Range Status  . WBC 06/23/2016 5.5  3.8 - 10.6 K/uL Final  . RBC 06/23/2016 4.64  4.40 - 5.90 MIL/uL Final  . Hemoglobin 06/23/2016 15.3  13.0 - 18.0 g/dL Final  . HCT 06/23/2016 43.3  40.0 - 52.0 % Final  . MCV 06/23/2016 93.3  80.0 - 100.0 fL Final  . MCH 06/23/2016 32.9  26.0 - 34.0 pg  Final  . MCHC 06/23/2016 35.3  32.0 - 36.0 g/dL Final  . RDW 06/23/2016 13.6  11.5 - 14.5 % Final  . Platelets 06/23/2016 224  150 - 440 K/uL Final  . Neutrophils Relative % 06/23/2016 46  % Final  . Neutro Abs 06/23/2016 2.5  1.4 - 6.5 K/uL Final  . Lymphocytes Relative 06/23/2016 41  % Final  . Lymphs Abs 06/23/2016 2.3  1.0 - 3.6 K/uL Final  . Monocytes Relative 06/23/2016 9  % Final  . Monocytes Absolute 06/23/2016 0.5  0.2 - 1.0 K/uL Final  . Eosinophils Relative 06/23/2016 3  % Final  . Eosinophils Absolute 06/23/2016 0.2  0 - 0.7 K/uL Final  . Basophils Relative 06/23/2016 1  % Final  . Basophils Absolute 06/23/2016 0.1  0 - 0.1 K/uL Final  . Sodium 06/23/2016 135  135 - 145 mmol/L Final  . Potassium 06/23/2016 4.1  3.5 - 5.1 mmol/L Final  . Chloride 06/23/2016 106  101 - 111 mmol/L Final  . CO2 06/23/2016 25  22 - 32 mmol/L Final  . Glucose, Bld 06/23/2016 104* 65 - 99 mg/dL Final  . BUN 06/23/2016 20  6 - 20 mg/dL Final  . Creatinine, Ser 06/23/2016 1.24  0.61 - 1.24 mg/dL Final  . Calcium 06/23/2016 9.5  8.9 - 10.3 mg/dL Final  . Total Protein 06/23/2016 7.0  6.5 - 8.1 g/dL Final  . Albumin 06/23/2016 4.6  3.5 - 5.0 g/dL Final  . AST 06/23/2016 21  15 - 41 U/L Final  . ALT 06/23/2016 19  17 - 63 U/L Final  . Alkaline Phosphatase 06/23/2016 57  38 - 126 U/L Final  . Total Bilirubin 06/23/2016 1.0  0.3 - 1.2 mg/dL Final  . GFR calc non Af Amer 06/23/2016 >60  >60 mL/min Final  . GFR calc Af Amer 06/23/2016 >60  >60 mL/min Final   Comment: (NOTE) The eGFR has been calculated using the CKD EPI equation. This calculation has not been validated in all clinical situations. eGFR's persistently <60 mL/min signify possible Chronic Kidney Disease.   . Anion gap 06/23/2016 4* 5 - 15 Final  . Ferritin 06/23/2016 40  24 - 336 ng/mL Final  . Vitamin B-12 06/23/2016 414  180 - 914 pg/mL Final   Comment: (NOTE) This assay is not validated for testing neonatal or myeloproliferative  syndrome specimens for Vitamin B12 levels. Performed at [facility], [place] [address], [place], [place] [address]     Assessment:  Andrew Peters is a 51 y.o. male old African American gentleman with stage IB gastric carcinoma.  He presented in 08/2011 with an 8 month history of epigastric pain, early satiety, anorexia, and hematemesis.  Upper endoscopy on 08/19/2011 revealed a large gastric antral ulcer.  Biopsies demonstrated chronic active gastritis with Helicobacter pylori as well as intramucosal adenocarcinoma.  PET scan did not reveal any disease outside of the stomach.    He underwent partial distal gastrectomy  on 10/04/2011.  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  Margins were negative.  Nine lymph nodes were negative.  Pathologic stage was T2N0M0.  CEA has ranged between 4 and 5.  He developed hematochezia and LUQ abdominal pain.   Abdomen and pelvic CT with contrast on 10/30/2014 revealed no evidence of bowel obstruction or inflammation.  There were multiple stable liver cysts and hemangiomas.  There was no evidence of residual tumor.  There was prostate enlargement with bladder wall thickening.  EGD on 11/13/2014 revealed a normal esophagus, gastric ulcer with a clean base and normal duodenum. Gastric biopsy revealed oxyntic mucosa with focal moderate chronic gastritis, negative for H pylori, dysplasia, and malignancy.  Colonoscopy on 11/13/2014 revealed a poor prep. There was a medium polyp in the descending colon. Pathology revealed a tubular adenoma with no high grade dysplasia or malignancy.  Repeat colonoscopy was recommended in 5 years.  Abdomen and pelvic CT scan on 08/31/2015 revealed no evidence of metastatic disease.  Ferritin has been followed:  55 on 08/20/2015, 51 on 12/10/2015, and 40 on 06/23/2016.  B12 was 414 on 06/23/2016.  Symptomatically, he feels good.  He notes a possible hernia.  Exam is normal.   Ferritin is 40 and B12 414 (180-914).  Plan: 1.  Labs today:  CBC with diff, CMP, ferritin, B12.  2.  Continue follow-up every 6-12 months for years 3-5 then annually.  EGD and CT scans will be performed as indicated.  We discussed monitoring for B12 and iron deficiency given gastric resection. 3.  Discuss follow-up with Dr. Netty Starring for any hernia concerns.  Discuss hernia issues which require immediate attention. 4.  RTC in 6 months for MD assessment and labs (CBC with diff, CMP, ferrtin, B12).    Lequita Asal, MD  12/27/2016, 4:16 AM     I saw and evaluated the patient, participating in the key portions of the service and reviewing pertinent diagnostic studies and records.  I reviewed the nurse practitioner's note and agree with the findings and the plan.  The assessment and plan were discussed with the patient.  Additional diagnostic studies of *** are needed to clarify *** and would change the clinical management.  A few ***multiple questions were asked by the patient and answered.   Nolon Stalls, MD 12/27/2016,4:16 AM

## 2017-01-20 ENCOUNTER — Inpatient Hospital Stay: Payer: Medicaid Other

## 2017-01-20 ENCOUNTER — Ambulatory Visit: Payer: Medicaid Other | Admitting: Hematology and Oncology

## 2017-01-20 ENCOUNTER — Other Ambulatory Visit: Payer: Medicaid Other

## 2017-01-20 ENCOUNTER — Inpatient Hospital Stay: Payer: Medicaid Other | Attending: Hematology and Oncology | Admitting: Hematology and Oncology

## 2017-01-20 VITALS — BP 116/76 | HR 81 | Temp 96.2°F | Resp 20 | Wt 165.3 lb

## 2017-01-20 DIAGNOSIS — F17210 Nicotine dependence, cigarettes, uncomplicated: Secondary | ICD-10-CM | POA: Diagnosis not present

## 2017-01-20 DIAGNOSIS — Z85028 Personal history of other malignant neoplasm of stomach: Secondary | ICD-10-CM | POA: Diagnosis not present

## 2017-01-20 DIAGNOSIS — Z903 Acquired absence of stomach [part of]: Secondary | ICD-10-CM | POA: Diagnosis not present

## 2017-01-20 DIAGNOSIS — C169 Malignant neoplasm of stomach, unspecified: Secondary | ICD-10-CM

## 2017-01-20 LAB — CBC WITH DIFFERENTIAL/PLATELET
Basophils Absolute: 0 10*3/uL (ref 0–0.1)
Basophils Relative: 1 %
Eosinophils Absolute: 0.1 10*3/uL (ref 0–0.7)
Eosinophils Relative: 2 %
HCT: 46.7 % (ref 40.0–52.0)
Hemoglobin: 15.6 g/dL (ref 13.0–18.0)
Lymphocytes Relative: 41 %
Lymphs Abs: 1.9 10*3/uL (ref 1.0–3.6)
MCH: 32.4 pg (ref 26.0–34.0)
MCHC: 33.5 g/dL (ref 32.0–36.0)
MCV: 96.8 fL (ref 80.0–100.0)
Monocytes Absolute: 0.5 10*3/uL (ref 0.2–1.0)
Monocytes Relative: 10 %
Neutro Abs: 2.1 10*3/uL (ref 1.4–6.5)
Neutrophils Relative %: 46 %
Platelets: 223 10*3/uL (ref 150–440)
RBC: 4.82 MIL/uL (ref 4.40–5.90)
RDW: 14.1 % (ref 11.5–14.5)
WBC: 4.6 10*3/uL (ref 3.8–10.6)

## 2017-01-20 LAB — COMPREHENSIVE METABOLIC PANEL
ALT: 18 U/L (ref 17–63)
AST: 23 U/L (ref 15–41)
Albumin: 4.5 g/dL (ref 3.5–5.0)
Alkaline Phosphatase: 59 U/L (ref 38–126)
Anion gap: 8 (ref 5–15)
BUN: 20 mg/dL (ref 6–20)
CO2: 25 mmol/L (ref 22–32)
Calcium: 9.5 mg/dL (ref 8.9–10.3)
Chloride: 106 mmol/L (ref 101–111)
Creatinine, Ser: 1.26 mg/dL — ABNORMAL HIGH (ref 0.61–1.24)
GFR calc Af Amer: >60 mL/min (ref >60)
GFR calc non Af Amer: >60 mL/min (ref >60)
Glucose, Bld: 107 mg/dL — ABNORMAL HIGH (ref 65–99)
Potassium: 3.9 mmol/L (ref 3.5–5.1)
Sodium: 139 mmol/L (ref 135–145)
Total Bilirubin: 1.1 mg/dL (ref 0.3–1.2)
Total Protein: 7 g/dL (ref 6.5–8.1)

## 2017-01-20 LAB — FERRITIN: Ferritin: 44 ng/mL (ref 24–336)

## 2017-01-20 NOTE — Progress Notes (Signed)
[facility] day:  01/20/17   Chief Complaint: Andrew Peters is a 51 y.o. male with stage IB gastric cancer who is seen for 6 month assessment.  HPI: The patient was last seen in the medical oncology clinic on  06/23/2016.  At that time, he felt good.  He noted a possible hernia.  Exam was normal.  Ferritin was 40 and B12 was 414 (180-914).  During the interim, patient is doing "great".  He has no physical concerns today. He denies changes in his bowel habits. He has no bleeding; no hematochezia, melena, or gross hematuria. Patient eating well, and his weight remains stable.    Past Medical History:  Diagnosis Date  . Cancer [facility]) 2012   Gastrectomy    Past Surgical History:  Procedure Laterality Date  . COLONOSCOPY WITH PROPOFOL N/A 11/13/2014   Procedure: COLONOSCOPY WITH PROPOFOL;  Surgeon: Hulen Luster, MD;  Location: [facility] ENDOSCOPY;  Service: Gastroenterology;  Laterality: N/A;  . ESOPHAGOGASTRODUODENOSCOPY (EGD) WITH PROPOFOL N/A 11/13/2014   Procedure: ESOPHAGOGASTRODUODENOSCOPY (EGD) WITH PROPOFOL;  Surgeon: Hulen Luster, MD;  Location: [facility] ENDOSCOPY;  Service: Gastroenterology;  Laterality: N/A;  . 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    No family history on file.  Social History:  reports that he has been smoking cigarettes.  He has been smoking about 0.25 packs per day. he has never used smokeless tobacco. He reports that he drinks alcohol. He reports that he does not use drugs.  He works at the [employer].  He loves to roller skate.  He smokes 5 cigarettes a week.  His cousin was recently killed.  His niece is getting married in [place].  He rode a scooter to clinic today.  The patient is alone today.  Allergies: No Known Allergies  Current Medications: No current outpatient medications on file.   No current facility-administered medications for this visit.    Review of Systems:   GENERAL:  Feels good.  No fevers or sweats.  Weight stable PERFORMANCE STATUS (ECOG):  0 HEENT:  No visual changes, runny nose, sore throat, mouth sores or tenderness. Lungs: No shortness of breath or cough.  No hemoptysis. Cardiac:  No chest pain, palpitations, orthopnea, or PND. GI:  No abdominal pain.  Eating well.  No vomiting, melena or hematochezia. GU:  No urgency, frequency, dysuria, or hematuria.  May have a hernia. Musculoskeletal:  No back pain.  Shoulder popping.  No muscle tenderness. Extremities:  No pain or swelling. Skin:  No rashes or skin changes. Neuro:  No headache, numbness or weakness, balance or coordination issues. Endocrine:  No diabetes, thyroid issues, hot flashes or night sweats. Psych:  No mood changes, depression or anxiety. Pain:  No pain. Review of systems:  All other systems reviewed and found to be negative.  Physical Exam: Blood pressure 116/76, pulse 81, temperature (!) 96.2 F (35.7 C), temperature source Tympanic, resp. rate 20, weight 165 lb 5 oz (75 kg). GENERAL:  Well developed, well nourished, gentleman sitting comfortably in the exam room in no acute distress. MENTAL STATUS:  Alert and oriented to person, place and time. HEAD:  Short gray hair.  Goatee.  Normocephalic, atraumatic, face symmetric, no Cushingoid features. EYES:  Brown eyes.  Pupils equal round and reactive to light and accomodation.  No conjunctivitis or scleral icterus. ENT:  Oropharynx clear without lesion.  Missing front tooth.  Tongue normal. Mucous membranes moist.  RESPIRATORY:  Clear to auscultation without rales, wheezes or rhonchi. CARDIOVASCULAR:  Regular rate and rhythm without murmur, rub or gallop. ABDOMEN:  Soft, non-tender with active bowel sounds, and no hepatosplenomegaly.  No masses. SKIN:  No rashes, ulcers or lesions. EXTREMITIES: No edema, no skin discoloration or tenderness.  No palpable cords. LYMPH NODES: No palpable cervical, supraclavicular, axillary or  inguinal adenopathy  NEUROLOGICAL: Unremarkable. PSYCH:  Appropriate.   Appointment on 01/20/2017  Component Date Value Ref Range Status  . Sodium 01/20/2017 139  135 - 145 mmol/L Final  . Potassium 01/20/2017 3.9  3.5 - 5.1 mmol/L Final  . Chloride 01/20/2017 106  101 - 111 mmol/L Final  . CO2 01/20/2017 25  22 - 32 mmol/L Final  . Glucose, Bld 01/20/2017 107* 65 - 99 mg/dL Final  . BUN 01/20/2017 20  6 - 20 mg/dL Final  . Creatinine, Ser 01/20/2017 1.26* 0.61 - 1.24 mg/dL Final  . Calcium 01/20/2017 9.5  8.9 - 10.3 mg/dL Final  . Total Protein 01/20/2017 7.0  6.5 - 8.1 g/dL Final  . Albumin 01/20/2017 4.5  3.5 - 5.0 g/dL Final  . AST 01/20/2017 23  15 - 41 U/L Final  . ALT 01/20/2017 18  17 - 63 U/L Final  . Alkaline Phosphatase 01/20/2017 59  38 - 126 U/L Final  . Total Bilirubin 01/20/2017 1.1  0.3 - 1.2 mg/dL Final  . GFR calc non Af Amer 01/20/2017 >60  >60 mL/min Final  . GFR calc Af Amer 01/20/2017 >60  >60 mL/min Final   Comment: (NOTE) The eGFR has been calculated using the CKD EPI equation. This calculation has not been validated in all clinical situations. eGFR's persistently <60 mL/min signify possible Chronic Kidney Disease.   . Anion gap 01/20/2017 8  5 - 15 Final  . WBC 01/20/2017 4.6  3.8 - 10.6 K/uL Final  . RBC 01/20/2017 4.82  4.40 - 5.90 MIL/uL Final  . Hemoglobin 01/20/2017 15.6  13.0 - 18.0 g/dL Final  . HCT 01/20/2017 46.7  40.0 - 52.0 % Final  . MCV 01/20/2017 96.8  80.0 - 100.0 fL Final  . MCH 01/20/2017 32.4  26.0 - 34.0 pg Final  . MCHC 01/20/2017 33.5  32.0 - 36.0 g/dL Final  . RDW 01/20/2017 14.1  11.5 - 14.5 % Final  . Platelets 01/20/2017 223  150 - 440 K/uL Final  . Neutrophils Relative % 01/20/2017 46  % Final  . Neutro Abs 01/20/2017 2.1  1.4 - 6.5 K/uL Final  . Lymphocytes Relative 01/20/2017 41  % Final  . Lymphs Abs 01/20/2017 1.9  1.0 - 3.6 K/uL Final  . Monocytes Relative 01/20/2017 10  % Final  . Monocytes Absolute 01/20/2017 0.5   0.2 - 1.0 K/uL Final  . Eosinophils Relative 01/20/2017 2  % Final  . Eosinophils Absolute 01/20/2017 0.1  0 - 0.7 K/uL Final  . Basophils Relative 01/20/2017 1  % Final  . Basophils Absolute 01/20/2017 0.0  0 - 0.1 K/uL Final    Assessment:  Andrew Peters is a 51 y.o. male old African American gentleman with stage IB gastric carcinoma.  He presented in 08/2011 with an 8 month history of epigastric pain, early satiety, anorexia, and hematemesis.  Upper endoscopy on 08/19/2011 revealed a large gastric antral ulcer.  Biopsies demonstrated chronic active gastritis with Helicobacter pylori as well as intramucosal adenocarcinoma.  PET scan did not reveal any disease outside of the stomach.  He underwent partial distal gastrectomy on 10/04/2011.  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  Margins were negative.  Nine lymph nodes were negative.  Pathologic stage was T2N0M0.  CEA has ranged between 4 and 5.  He developed hematochezia and LUQ abdominal pain.   Abdomen and pelvic CT with contrast on 10/30/2014 revealed no evidence of bowel obstruction or inflammation.  There were multiple stable liver cysts and hemangiomas.  There was no evidence of residual tumor.  There was prostate enlargement with bladder wall thickening.  EGD on 11/13/2014 revealed a normal esophagus, gastric ulcer with a clean base and normal duodenum. Gastric biopsy revealed oxyntic mucosa with focal moderate chronic gastritis, negative for H pylori, dysplasia, and malignancy.  Colonoscopy on 11/13/2014 revealed a poor prep. There was a medium polyp in the descending colon. Pathology revealed a tubular adenoma with no high grade dysplasia or malignancy.  Repeat colonoscopy was recommended in 5 years.  Abdomen and pelvic CT scan on 08/31/2015 revealed no evidence of metastatic disease.  Ferritin has been followed:  55 on 08/20/2015, 51 on 12/10/2015, and 40 on 06/23/2016.   B12 was 414 on 06/23/2016.  Symptomatically, he feels good.  He denies any physical complaints. No changes noted in his bowel habits. No GI bleeding noted.  Exam is normal. Labs are stable. Creatine  Is 1.26.   Plan: 1.  Labs today:  CBC with diff, CMP, ferritin, B12. 2.  Continue follow-up every 6-12 months for years 3-5 then annually.  EGD and CT scans will be performed as indicated.  We discussed monitoring for B12 and iron deficiency given gastric resection. 3.  RTC in 6 months for MD assessment and labs (CBC with diff, CMP, ferrtin, B12).    Honor Loh, NP  01/20/2017, 3:19 PM   I saw and evaluated the patient, participating in the key portions of the service and reviewing pertinent diagnostic studies and records.  I reviewed the nurse practitioner's note and agree with the findings and the plan.  The assessment and plan were discussed with the patient.  A few questions were asked by the patient and answered.   Nolon Stalls, MD 01/20/2017,3:19 PM

## 2017-01-20 NOTE — Progress Notes (Signed)
Patient offers no complaints today. 

## 2017-01-21 LAB — VITAMIN B12: Vitamin B-12: 371 pg/mL (ref 180–914)

## 2017-01-22 ENCOUNTER — Encounter: Payer: Self-pay | Admitting: Hematology and Oncology

## 2017-03-08 DIAGNOSIS — H524 Presbyopia: Secondary | ICD-10-CM | POA: Diagnosis not present

## 2017-07-21 DIAGNOSIS — Z113 Encounter for screening for infections with a predominantly sexual mode of transmission: Secondary | ICD-10-CM | POA: Diagnosis not present

## 2017-07-28 ENCOUNTER — Inpatient Hospital Stay: Payer: Medicaid Other

## 2017-07-28 ENCOUNTER — Inpatient Hospital Stay: Payer: Medicaid Other | Admitting: Hematology and Oncology

## 2017-07-28 NOTE — Progress Notes (Deleted)
[facility] day:  07/28/17   Chief Complaint: Andrew Peters is a 52 y.o. male with stage IB gastric cancer who is seen for 6 month assessment.  HPI: The patient was last seen in the medical oncology clinic on  01/20/2017.  At that time,  he felt good.  He denied any physical complaints. He denied any changes in his bowel habits. No GI bleeding was noted.  Exam was normal. Labs were stable. Creatinine was 1.26. Ferritin was 44.  B12 was 371.  During the interim,     Past Medical History:  Diagnosis Date  . Cancer [facility]) 2012   Gastrectomy    Past Surgical History:  Procedure Laterality Date  . COLONOSCOPY WITH PROPOFOL N/A 11/13/2014   Procedure: COLONOSCOPY WITH PROPOFOL;  Surgeon: Hulen Luster, MD;  Location[facility] ENDOSCOPY;  Service: Gastroenterology;  Laterality: N/A;  . ESOPHAGOGASTRODUODENOSCOPY (EGD) WITH PROPOFOL N/A 11/13/2014   Procedure: ESOPHAGOGASTRODUODENOSCOPY (EGD) WITH PROPOFOL;  Surgeon: Hulen Luster, MD;  Location: [facility] ENDOSCOPY;  Service: Gastroenterology;  Laterality: N/A;  . 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    No family history on file.  Social History:  reports that he has been smoking cigarettes.  He has been smoking about 0.25 packs per day. He has never used smokeless tobacco. He reports that he drinks alcohol. He reports that he does not use drugs.  He works at the [employer].  He loves to roller skate.  He smokes 5 cigarettes a week.  His cousin was recently killed.  His niece is getting married in [place].  He rode a scooter to clinic today.  The patient is alone today.  Allergies: No Known Allergies  Current Medications: No current outpatient medications on file.   No current facility-administered medications for this visit.    Review of Systems:  GENERAL:  Feels good.  No fevers or sweats.  Weight stable PERFORMANCE STATUS (ECOG):  0 HEENT:  No visual  changes, runny nose, sore throat, mouth sores or tenderness. Lungs: No shortness of breath or cough.  No hemoptysis. Cardiac:  No chest pain, palpitations, orthopnea, or PND. GI:  No abdominal pain.  Eating well.  No vomiting, melena or hematochezia. GU:  No urgency, frequency, dysuria, or hematuria.  May have a hernia. Musculoskeletal:  No back pain.  Shoulder popping.  No muscle tenderness. Extremities:  No pain or swelling. Skin:  No rashes or skin changes. Neuro:  No headache, numbness or weakness, balance or coordination issues. Endocrine:  No diabetes, thyroid issues, hot flashes or night sweats. Psych:  No mood changes, depression or anxiety. Pain:  No pain. Review of systems:  All other systems reviewed and found to be negative.  Physical Exam: There were no vitals taken for this visit. GENERAL:  Well developed, well nourished, gentleman sitting comfortably in the exam room in no acute distress. MENTAL STATUS:  Alert and oriented to person, place and time. HEAD:  Short gray hair.  Goatee.  Normocephalic, atraumatic, face symmetric, no Cushingoid features. EYES:  Brown eyes.  Pupils equal round and reactive to light and accomodation.  No conjunctivitis or scleral icterus. ENT:  Oropharynx clear without lesion.  Missing front tooth.  Tongue normal. Mucous membranes moist.  RESPIRATORY:  Clear to auscultation without rales, wheezes or rhonchi. CARDIOVASCULAR:  Regular rate and rhythm without murmur, rub or gallop. ABDOMEN:  Soft, non-tender with  active bowel sounds, and no hepatosplenomegaly.  No masses. SKIN:  No rashes, ulcers or lesions. EXTREMITIES: No edema, no skin discoloration or tenderness.  No palpable cords. LYMPH NODES: No palpable cervical, supraclavicular, axillary or inguinal adenopathy  NEUROLOGICAL: Unremarkable. PSYCH:  Appropriate.  GENERAL:  Well developed, well nourished, gentleman sitting comfortably in the exam room in no acute distress. MENTAL STATUS:   Alert and oriented to person, place and time. HEAD:  *** hair.  Normocephalic, atraumatic, face symmetric, no Cushingoid features. EYES:  *** eyes.  Pupils equal round and reactive to light and accomodation.  No conjunctivitis or scleral icterus. ENT:  Oropharynx clear without lesion.  Tongue normal. Mucous membranes moist.  RESPIRATORY:  Clear to auscultation without rales, wheezes or rhonchi. CARDIOVASCULAR:  Regular rate and rhythm without murmur, rub or gallop. ABDOMEN:  Soft, non-tender, with active bowel sounds, and no hepatosplenomegaly.  No masses. SKIN:  No rashes, ulcers or lesions. EXTREMITIES: No edema, no skin discoloration or tenderness.  No palpable cords. LYMPH NODES: No palpable cervical, supraclavicular, axillary or inguinal adenopathy  NEUROLOGICAL: Unremarkable. PSYCH:  Appropriate.    No visits with results within 3 Day(s) from this visit.  Latest known visit with results is:  Appointment on 01/20/2017  Component Date Value Ref Range Status  . Ferritin 01/20/2017 44  24 - 336 ng/mL Final  . Sodium 01/20/2017 139  135 - 145 mmol/L Final  . Potassium 01/20/2017 3.9  3.5 - 5.1 mmol/L Final  . Chloride 01/20/2017 106  101 - 111 mmol/L Final  . CO2 01/20/2017 25  22 - 32 mmol/L Final  . Glucose, Bld 01/20/2017 107* 65 - 99 mg/dL Final  . BUN 01/20/2017 20  6 - 20 mg/dL Final  . Creatinine, Ser 01/20/2017 1.26* 0.61 - 1.24 mg/dL Final  . Calcium 01/20/2017 9.5  8.9 - 10.3 mg/dL Final  . Total Protein 01/20/2017 7.0  6.5 - 8.1 g/dL Final  . Albumin 01/20/2017 4.5  3.5 - 5.0 g/dL Final  . AST 01/20/2017 23  15 - 41 U/L Final  . ALT 01/20/2017 18  17 - 63 U/L Final  . Alkaline Phosphatase 01/20/2017 59  38 - 126 U/L Final  . Total Bilirubin 01/20/2017 1.1  0.3 - 1.2 mg/dL Final  . GFR calc non Af Amer 01/20/2017 >60  >60 mL/min Final  . GFR calc Af Amer 01/20/2017 >60  >60 mL/min Final   Comment: (NOTE) The eGFR has been calculated using the CKD EPI equation. This  calculation has not been validated in all clinical situations. eGFR's persistently <60 mL/min signify possible Chronic Kidney Disease.   . Anion gap 01/20/2017 8  5 - 15 Final  . WBC 01/20/2017 4.6  3.8 - 10.6 K/uL Final  . RBC 01/20/2017 4.82  4.40 - 5.90 MIL/uL Final  . Hemoglobin 01/20/2017 15.6  13.0 - 18.0 g/dL Final  . HCT 01/20/2017 46.7  40.0 - 52.0 % Final  . MCV 01/20/2017 96.8  80.0 - 100.0 fL Final  . MCH 01/20/2017 32.4  26.0 - 34.0 pg Final  . MCHC 01/20/2017 33.5  32.0 - 36.0 g/dL Final  . RDW 01/20/2017 14.1  11.5 - 14.5 % Final  . Platelets 01/20/2017 223  150 - 440 K/uL Final  . Neutrophils Relative % 01/20/2017 46  % Final  . Neutro Abs 01/20/2017 2.1  1.4 - 6.5 K/uL Final  . Lymphocytes Relative 01/20/2017 41  % Final  . Lymphs Abs 01/20/2017 1.9  1.0 - 3.6 K/uL  Final  . Monocytes Relative 01/20/2017 10  % Final  . Monocytes Absolute 01/20/2017 0.5  0.2 - 1.0 K/uL Final  . Eosinophils Relative 01/20/2017 2  % Final  . Eosinophils Absolute 01/20/2017 0.1  0 - 0.7 K/uL Final  . Basophils Relative 01/20/2017 1  % Final  . Basophils Absolute 01/20/2017 0.0  0 - 0.1 K/uL Final  . Vitamin B-12 01/20/2017 371  180 - 914 pg/mL Final   Comment: (NOTE) This assay is not validated for testing neonatal or myeloproliferative syndrome specimens for Vitamin B12 levels. Performed at [facility], [place] [address], [place], [place] [address]     Assessment:  Danish Ruffins is a 52 y.o. male old African American gentleman with stage IB gastric carcinoma.  He presented in 08/2011 with an 8 month history of epigastric pain, early satiety, anorexia, and hematemesis.  Upper endoscopy on 08/19/2011 revealed a large gastric antral ulcer.  Biopsies demonstrated chronic active gastritis with Helicobacter pylori as well as intramucosal adenocarcinoma.  PET scan did not reveal any disease outside of the stomach.    He underwent partial distal gastrectomy on 10/04/2011.  Pathology  revealed a 7 x 4 x 1 cm invasive moderately differentiated adenocarcinoma of the stomach.  Margins were negative.  Nine lymph nodes were negative.  Pathologic stage was T2N0M0.  CEA has ranged between 4 and 5.  He developed hematochezia and LUQ abdominal pain.   Abdomen and pelvic CT with contrast on 10/30/2014 revealed no evidence of bowel obstruction or inflammation.  There were multiple stable liver cysts and hemangiomas.  There was no evidence of residual tumor.  There was prostate enlargement with bladder wall thickening.  EGD on 11/13/2014 revealed a normal esophagus, gastric ulcer with a clean base and normal duodenum. Gastric biopsy revealed oxyntic mucosa with focal moderate chronic gastritis, negative for H pylori, dysplasia, and malignancy.  Colonoscopy on 11/13/2014 revealed a poor prep. There was a medium polyp in the descending colon. Pathology revealed a tubular adenoma with no high grade dysplasia or malignancy.  Repeat colonoscopy was recommended in 5 years.  Abdomen and pelvic CT scan on 08/31/2015 revealed no evidence of metastatic disease.  Ferritin has been followed:  55 on 08/20/2015, 51 on 12/10/2015, 40 on 06/23/2016, and 44 on 01/20/2017.  B12 was 414 on 06/23/2016 and 371 on 01/20/2017.  Symptomatically, he feels good.  He denies any physical complaints. No changes noted in his bowel habits. No GI bleeding noted.  Exam is normal. Labs are stable. Creatine  Is 1.26.   Plan: 1.  Labs today:  CBC with diff, CMP, ferritin, B12.   2.  Continue follow-up every 6-12 months for years 3-5 then annually.  EGD and CT scans will be performed as indicated.  We discussed monitoring for B12 and iron deficiency given gastric resection. 3.  RTC in 6 months for MD assessment and labs (CBC with diff, CMP, ferrtin, B12).    Lequita Asal, MD  07/28/2017, 6:27 AM   I saw and evaluated the patient, participating in the key portions of the service and reviewing pertinent diagnostic  studies and records.  I reviewed the nurse practitioner's note and agree with the findings and the plan.  The assessment and plan were discussed with the patient.  A few questions were asked by the patient and answered.   Nolon Stalls, MD 07/28/2017,6:27 AM

## 2017-08-08 ENCOUNTER — Inpatient Hospital Stay: Payer: Medicaid Other | Attending: Hematology and Oncology

## 2017-08-08 ENCOUNTER — Inpatient Hospital Stay (HOSPITAL_BASED_OUTPATIENT_CLINIC_OR_DEPARTMENT_OTHER): Payer: Medicaid Other | Admitting: Hematology and Oncology

## 2017-08-08 ENCOUNTER — Encounter: Payer: Self-pay | Admitting: *Deleted

## 2017-08-08 ENCOUNTER — Encounter: Payer: Self-pay | Admitting: Hematology and Oncology

## 2017-08-08 VITALS — BP 127/82 | HR 71 | Temp 97.4°F | Resp 16 | Wt 156.4 lb

## 2017-08-08 DIAGNOSIS — Z85028 Personal history of other malignant neoplasm of stomach: Secondary | ICD-10-CM | POA: Diagnosis not present

## 2017-08-08 DIAGNOSIS — F17210 Nicotine dependence, cigarettes, uncomplicated: Secondary | ICD-10-CM | POA: Diagnosis not present

## 2017-08-08 DIAGNOSIS — Z903 Acquired absence of stomach [part of]: Secondary | ICD-10-CM | POA: Diagnosis not present

## 2017-08-08 DIAGNOSIS — R7989 Other specified abnormal findings of blood chemistry: Secondary | ICD-10-CM | POA: Diagnosis not present

## 2017-08-08 DIAGNOSIS — C169 Malignant neoplasm of stomach, unspecified: Secondary | ICD-10-CM

## 2017-08-08 DIAGNOSIS — R17 Unspecified jaundice: Secondary | ICD-10-CM

## 2017-08-08 LAB — CBC WITH DIFFERENTIAL/PLATELET
Basophils Absolute: 0 10*3/uL (ref 0–0.1)
Basophils Relative: 1 %
Eosinophils Absolute: 0.1 10*3/uL (ref 0–0.7)
Eosinophils Relative: 3 %
HCT: 46.4 % (ref 40.0–52.0)
Hemoglobin: 15.9 g/dL (ref 13.0–18.0)
Lymphocytes Relative: 49 %
Lymphs Abs: 2.4 10*3/uL (ref 1.0–3.6)
MCH: 32.6 pg (ref 26.0–34.0)
MCHC: 34.3 g/dL (ref 32.0–36.0)
MCV: 94.9 fL (ref 80.0–100.0)
Monocytes Absolute: 0.5 10*3/uL (ref 0.2–1.0)
Monocytes Relative: 10 %
Neutro Abs: 1.8 10*3/uL (ref 1.4–6.5)
Neutrophils Relative %: 37 %
Platelets: 188 10*3/uL (ref 150–440)
RBC: 4.89 MIL/uL (ref 4.40–5.90)
RDW: 13.7 % (ref 11.5–14.5)
WBC: 4.9 10*3/uL (ref 3.8–10.6)

## 2017-08-08 LAB — COMPREHENSIVE METABOLIC PANEL
ALT: 20 U/L (ref 0–44)
AST: 27 U/L (ref 15–41)
Albumin: 4.4 g/dL (ref 3.5–5.0)
Alkaline Phosphatase: 62 U/L (ref 38–126)
Anion gap: 9 (ref 5–15)
BUN: 17 mg/dL (ref 6–20)
CO2: 23 mmol/L (ref 22–32)
Calcium: 9.5 mg/dL (ref 8.9–10.3)
Chloride: 108 mmol/L (ref 98–111)
Creatinine, Ser: 1.18 mg/dL (ref 0.61–1.24)
GFR calc Af Amer: >60 mL/min (ref >60)
GFR calc non Af Amer: >60 mL/min (ref >60)
Glucose, Bld: 111 mg/dL — ABNORMAL HIGH (ref 70–99)
Potassium: 3.7 mmol/L (ref 3.5–5.1)
Sodium: 140 mmol/L (ref 135–145)
Total Bilirubin: 1.6 mg/dL — ABNORMAL HIGH (ref 0.3–1.2)
Total Protein: 7 g/dL (ref 6.5–8.1)

## 2017-08-08 LAB — BILIRUBIN, DIRECT: BILIRUBIN DIRECT: 0.2 mg/dL (ref 0.0–0.2)

## 2017-08-08 LAB — VITAMIN B12: Vitamin B-12: 485 pg/mL (ref 180–914)

## 2017-08-08 LAB — FERRITIN: Ferritin: 39 ng/mL (ref 24–336)

## 2017-08-08 NOTE — Progress Notes (Signed)
Pt in for follow up, denies any signs of GI bleed or other concerns.  Request note to release him to "workout at gym again".

## 2017-08-08 NOTE — Progress Notes (Signed)
[facility] day:  08/08/17   Chief Complaint: Andrew Peters is a 52 y.o. male with stage IB gastric cancer who is seen for 6 month assessment.  HPI: The patient was last seen in the medical oncology clinic on  01/20/2017.  At that time,  he felt good.  He denied any physical complaints. He denied any changes in his bowel habits. No GI bleeding was noted.  Exam was normal. Labs were stable. Creatinine was 1.26. Ferritin was 44.  B12 was 371.  During the interim, patient notes that life has been going cool. He has had "cold symptoms" for the last 3 days. He has a productive cough. He has not has any fevers.  Patient denies that he has experienced any B symptoms. He denies any interval infections. Patient denies bleeding; no hematochezia, melena, or gross hematuria.   Patient advises that he maintains an adequate appetite. He is eating well. Weight today is 156 lb 6 oz (70.9 kg), which compared to his last visit to the clinic, represents a 9 pound decrease.   Patient denies pain in the clinic today.   Past Medical History:  Diagnosis Date  . Cancer [facility]) 2012   Gastrectomy    Past Surgical History:  Procedure Laterality Date  . COLONOSCOPY WITH PROPOFOL N/A 11/13/2014   Procedure: COLONOSCOPY WITH PROPOFOL;  Surgeon: Hulen Luster, MD;  Location: [facility] ENDOSCOPY;  Service: Gastroenterology;  Laterality: N/A;  . ESOPHAGOGASTRODUODENOSCOPY (EGD) WITH PROPOFOL N/A 11/13/2014   Procedure: ESOPHAGOGASTRODUODENOSCOPY (EGD) WITH PROPOFOL;  Surgeon: Hulen Luster, MD;  Location: [facility] ENDOSCOPY;  Service: Gastroenterology;  Laterality: N/A;  . 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    History reviewed. No pertinent family history.  Social History:  reports that he has been smoking cigarettes.  He has been smoking about 0.25 packs per day. He has never used smokeless tobacco. He reports that he drinks alcohol. He reports  that he does not use drugs.  He works at the [employer].  He loves to roller skate.  He smokes 5 cigarettes a week.  His cousin was recently killed.  His niece is getting married in [place].  He works out in [employer].  The patient is alone today.  Allergies: No Known Allergies  Current Medications: No current outpatient medications on file.   No current facility-administered medications for this visit.     Review of Systems  Constitutional: Positive for weight loss (down 9 pounds). Negative for diaphoresis, fever and malaise/fatigue.  HENT: Positive for congestion. Negative for nosebleeds and sore throat.   Eyes: Negative.   Respiratory: Positive for cough. Negative for hemoptysis, sputum production and shortness of breath.   Cardiovascular: Negative for chest pain, palpitations, orthopnea, leg swelling and PND.  Gastrointestinal: Negative for abdominal pain, blood in stool, constipation, diarrhea, melena, nausea and vomiting.       Eating well.  Genitourinary: Negative for dysuria, frequency, hematuria and urgency.  Musculoskeletal: Negative for back pain, falls, joint pain and myalgias.  Skin: Negative for itching and rash.  Neurological: Negative for dizziness, tremors, weakness and headaches.  Endo/Heme/Allergies: Does not bruise/bleed easily.  Psychiatric/Behavioral: Negative for depression, memory loss and suicidal ideas. The patient is not nervous/anxious and does not have insomnia.   All other systems reviewed and are negative.  Performance status (ECOG): 0 - Asymptomatic  Physical Exam: Blood pressure 127/82, pulse 71, temperature (!)  97.4 F (36.3 C), temperature source Tympanic, resp. rate 16, weight 156 lb 6 oz (70.9 kg). GENERAL:  Well developed, well nourished, gentleman sitting comfortably in the exam room in no acute distress. MENTAL STATUS:  Alert and oriented to person, place and time. HEAD:  Short hair.  Normocephalic, atraumatic, face symmetric, no  Cushingoid features. EYES:  Brown eyes.  Pupils equal round and reactive to light and accomodation.  No conjunctivitis or scleral icterus. ENT:  Oropharynx clear without lesion.  Tongue normal. Mucous membranes moist.  RESPIRATORY:  Clear to auscultation without rales, wheezes or rhonchi. CARDIOVASCULAR:  Regular rate and rhythm without murmur, rub or gallop. ABDOMEN:  Soft, non-tender, with active bowel sounds, and no hepatosplenomegaly.  No masses. SKIN:  No rashes, ulcers or lesions. EXTREMITIES: No edema, no skin discoloration or tenderness.  No palpable cords. LYMPH NODES: No palpable cervical, supraclavicular, axillary or inguinal adenopathy  NEUROLOGICAL: Unremarkable. PSYCH:  Appropriate.    Appointment on 08/08/2017  Component Date Value Ref Range Status  . Sodium 08/08/2017 140  135 - 145 mmol/L Final  . Potassium 08/08/2017 3.7  3.5 - 5.1 mmol/L Final  . Chloride 08/08/2017 108  98 - 111 mmol/L Final   Please note change in reference range.  . CO2 08/08/2017 23  22 - 32 mmol/L Final  . Glucose, Bld 08/08/2017 111* 70 - 99 mg/dL Final   Please note change in reference range.  . BUN 08/08/2017 17  6 - 20 mg/dL Final   Please note change in reference range.  . Creatinine, Ser 08/08/2017 1.18  0.61 - 1.24 mg/dL Final  . Calcium 08/08/2017 9.5  8.9 - 10.3 mg/dL Final  . Total Protein 08/08/2017 7.0  6.5 - 8.1 g/dL Final  . Albumin 08/08/2017 4.4  3.5 - 5.0 g/dL Final  . AST 08/08/2017 27  15 - 41 U/L Final  . ALT 08/08/2017 20  0 - 44 U/L Final   Please note change in reference range.  . Alkaline Phosphatase 08/08/2017 62  38 - 126 U/L Final  . Total Bilirubin 08/08/2017 1.6* 0.3 - 1.2 mg/dL Final  . GFR calc non Af Amer 08/08/2017 >60  >60 mL/min Final  . GFR calc Af Amer 08/08/2017 >60  >60 mL/min Final   Comment: (NOTE) The eGFR has been calculated using the CKD EPI equation. This calculation has not been validated in all clinical situations. eGFR's persistently <60  mL/min signify possible Chronic Kidney Disease.   Georgiann Hahn gap 08/08/2017 9  5 - 15 Final   Performed at [facility], [address] [place], [place] [address]  . WBC 08/08/2017 4.9  3.8 - 10.6 K/uL Final  . RBC 08/08/2017 4.89  4.40 - 5.90 MIL/uL Final  . Hemoglobin 08/08/2017 15.9  13.0 - 18.0 g/dL Final  . HCT 08/08/2017 46.4  40.0 - 52.0 % Final  . MCV 08/08/2017 94.9  80.0 - 100.0 fL Final  . MCH 08/08/2017 32.6  26.0 - 34.0 pg Final  . MCHC 08/08/2017 34.3  32.0 - 36.0 g/dL Final  . RDW 08/08/2017 13.7  11.5 - 14.5 % Final  . Platelets 08/08/2017 188  150 - 440 K/uL Final  . Neutrophils Relative % 08/08/2017 37  % Final  . Neutro Abs 08/08/2017 1.8  1.4 - 6.5 K/uL Final  . Lymphocytes Relative 08/08/2017 49  % Final  . Lymphs Abs 08/08/2017 2.4  1.0 - 3.6 K/uL Final  . Monocytes Relative 08/08/2017 10  % Final  .  Monocytes Absolute 08/08/2017 0.5  0.2 - 1.0 K/uL Final  . Eosinophils Relative 08/08/2017 3  % Final  . Eosinophils Absolute 08/08/2017 0.1  0 - 0.7 K/uL Final  . Basophils Relative 08/08/2017 1  % Final  . Basophils Absolute 08/08/2017 0.0  0 - 0.1 K/uL Final   Performed at [facility], [address] [place], [place] [address]    Assessment:  Thong Feeny is a 52 y.o. male old African American gentleman with stage IB gastric carcinoma.  He presented in 08/2011 with an 8 month history of epigastric pain, early satiety, anorexia, and hematemesis.  Upper endoscopy on 08/19/2011 revealed a large gastric antral ulcer.  Biopsies demonstrated chronic active gastritis with Helicobacter pylori as well as intramucosal adenocarcinoma.  PET scan did not reveal any disease outside of the stomach.    He underwent partial distal gastrectomy on 10/04/2011.  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  Margins were negative.  Nine lymph nodes were negative.  Pathologic stage was T2N0M0.  CEA has ranged between  4 and 5.  He developed hematochezia and LUQ abdominal pain.   Abdomen and pelvic CT with contrast on 10/30/2014 revealed no evidence of bowel obstruction or inflammation.  There were multiple stable liver cysts and hemangiomas.  There was no evidence of residual tumor.  There was prostate enlargement with bladder wall thickening.  EGD on 11/13/2014 revealed a normal esophagus, gastric ulcer with a clean base and normal duodenum. Gastric biopsy revealed oxyntic mucosa with focal moderate chronic gastritis, negative for H pylori, dysplasia, and malignancy.  Colonoscopy on 11/13/2014 revealed a poor prep. There was a medium polyp in the descending colon. Pathology revealed a tubular adenoma with no high grade dysplasia or malignancy.  Repeat colonoscopy was recommended in 5 years.  Abdomen and pelvic CT scan on 08/31/2015 revealed no evidence of metastatic disease.  Ferritin has been followed:  55 on 08/20/2015, 51 on 12/10/2015, 40 on 06/23/2016, and 44 on 01/20/2017.  B12 was 414 on 06/23/2016 and 485 on 08/08/2017.  Symptomatically, he feels like he is catching a cold.  He denies any GI symptoms.  Exam is normal. Total bilirubin is 1.6. Creatinine 1.18 (previously 1.26).  Plan: 1. Labs today:  CBC with diff, CMP, ferritin, B12, direct bili. 2. Discuss elevated bilirubin. Total bilirubin is 1.6 today. 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 Will fractionate bilirubin today to further assess (direct bili 0.2).  3. Continue follow-up every 6-12 months for years 3-5 then annually.  EGD and CT scans will be performed as indicated.   4. Discuss monitoring for B12 and iron deficiency given gastric resection. 5. RTC in 6 months for MD assessment and labs (CBC with diff, CMP, ferritin, B12).    Honor Loh, NP  08/08/2017, 3:59 PM   I saw and evaluated the patient, participating in the key portions of the service and reviewing pertinent diagnostic studies and records.  I reviewed the nurse practitioner's note and  agree with the findings and the plan.  The assessment and plan were discussed with the patient.  A few questions were asked by the patient and answered.   Nolon Stalls, MD 08/08/2017,3:59 PM

## 2018-01-23 ENCOUNTER — Telehealth: Payer: Self-pay | Admitting: *Deleted

## 2018-01-23 NOTE — Telephone Encounter (Signed)
Tried calling patient several times over the past 2 weeks in regards to his 02/12/2018 appts. To see if he wanted to transfer to the [facility] Location with Dr.Corcoran or to be scheduled with another MD here in [place]. A detailed message was left on patient's vmail I will continue to try calling him about this matter..  A *NEW* appt schedule was mailed out to make pt aware. (Patient was moved to Dr. Tasia Catchings scheduled)

## 2018-02-12 ENCOUNTER — Other Ambulatory Visit: Payer: Medicaid Other

## 2018-02-12 ENCOUNTER — Ambulatory Visit: Payer: Medicaid Other | Admitting: Hematology and Oncology

## 2018-02-16 ENCOUNTER — Inpatient Hospital Stay: Payer: Medicaid Other | Attending: Oncology

## 2018-02-16 ENCOUNTER — Inpatient Hospital Stay (HOSPITAL_BASED_OUTPATIENT_CLINIC_OR_DEPARTMENT_OTHER): Payer: Medicaid Other | Admitting: Oncology

## 2018-02-16 ENCOUNTER — Encounter: Payer: Self-pay | Admitting: Oncology

## 2018-02-16 ENCOUNTER — Other Ambulatory Visit: Payer: Self-pay

## 2018-02-16 VITALS — BP 123/90 | HR 74 | Temp 96.9°F | Resp 18 | Wt 171.6 lb

## 2018-02-16 DIAGNOSIS — Z720 Tobacco use: Secondary | ICD-10-CM | POA: Insufficient documentation

## 2018-02-16 DIAGNOSIS — Z7289 Other problems related to lifestyle: Secondary | ICD-10-CM | POA: Insufficient documentation

## 2018-02-16 DIAGNOSIS — Z85028 Personal history of other malignant neoplasm of stomach: Secondary | ICD-10-CM | POA: Insufficient documentation

## 2018-02-16 DIAGNOSIS — Z789 Other specified health status: Secondary | ICD-10-CM

## 2018-02-16 DIAGNOSIS — C169 Malignant neoplasm of stomach, unspecified: Secondary | ICD-10-CM

## 2018-02-16 DIAGNOSIS — C168 Malignant neoplasm of overlapping sites of stomach: Secondary | ICD-10-CM

## 2018-02-16 LAB — COMPREHENSIVE METABOLIC PANEL
ALT: 23 U/L (ref 0–44)
AST: 29 U/L (ref 15–41)
Albumin: 4.5 g/dL (ref 3.5–5.0)
Alkaline Phosphatase: 55 U/L (ref 38–126)
Anion gap: 8 (ref 5–15)
BUN: 23 mg/dL — ABNORMAL HIGH (ref 6–20)
CO2: 25 mmol/L (ref 22–32)
Calcium: 9.2 mg/dL (ref 8.9–10.3)
Chloride: 108 mmol/L (ref 98–111)
Creatinine, Ser: 1.22 mg/dL (ref 0.61–1.24)
GFR calc Af Amer: >60 mL/min (ref >60)
GFR calc non Af Amer: >60 mL/min (ref >60)
Glucose, Bld: 117 mg/dL — ABNORMAL HIGH (ref 70–99)
Potassium: 4 mmol/L (ref 3.5–5.1)
Sodium: 141 mmol/L (ref 135–145)
Total Bilirubin: 1.1 mg/dL (ref 0.3–1.2)
Total Protein: 6.9 g/dL (ref 6.5–8.1)

## 2018-02-16 LAB — CBC WITH DIFFERENTIAL/PLATELET
Abs Immature Granulocytes: 0.01 10*3/uL (ref 0.00–0.07)
Basophils Absolute: 0 10*3/uL (ref 0.0–0.1)
Basophils Relative: 1 %
Eosinophils Absolute: 0.1 10*3/uL (ref 0.0–0.5)
Eosinophils Relative: 3 %
HCT: 41 % (ref 39.0–52.0)
Hemoglobin: 14 g/dL (ref 13.0–17.0)
Immature Granulocytes: 0 %
Lymphocytes Relative: 42 %
Lymphs Abs: 1.8 10*3/uL (ref 0.7–4.0)
MCH: 31.8 pg (ref 26.0–34.0)
MCHC: 34.1 g/dL (ref 30.0–36.0)
MCV: 93.2 fL (ref 80.0–100.0)
Monocytes Absolute: 0.5 10*3/uL (ref 0.1–1.0)
Monocytes Relative: 12 %
Neutro Abs: 1.8 10*3/uL (ref 1.7–7.7)
Neutrophils Relative %: 42 %
Platelets: 202 10*3/uL (ref 150–400)
RBC: 4.4 MIL/uL (ref 4.22–5.81)
RDW: 13 % (ref 11.5–15.5)
WBC: 4.3 10*3/uL (ref 4.0–10.5)
nRBC: 0 % (ref 0.0–0.2)

## 2018-02-16 LAB — VITAMIN B12: Vitamin B-12: 376 pg/mL (ref 180–914)

## 2018-02-16 LAB — FERRITIN: Ferritin: 41 ng/mL (ref 24–336)

## 2018-02-16 NOTE — Progress Notes (Signed)
[facility] day:  02/16/18   Chief Complaint: Andrew Peters is a 53 y.o. male with stage IB gastric cancer who is seen for 6 month assessment.  Andrew Peters is a 53 y.o.[place] who has above oncology history reviewed by me today presented for follow up visit for management of Stage IB gastric carcinoma. Patient's previous oncology care was with Dr.Corcoran who switches practice to [place]. Patient starts to follow up with me on 02/16/2018.  Reviewed patient's previous oncology records.   He presented in 08/2011 with an 8 month history of epigastric pain, early satiety, anorexia, and hematemesis.  Upper endoscopy on 08/19/2011 revealed a large gastric antral ulcer.  Biopsies demonstrated chronic active gastritis with Helicobacter pylori as well as intramucosal adenocarcinoma.  PET scan did not reveal any disease outside of the stomach.    He underwent partial distal gastrectomy on 10/04/2011.  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  Margins were negative. Nine lymph nodes were negative.  Pathologic stage was T2N0M0.  CEA has ranged between 4 and 5.  He developed hematochezia and LUQ abdominal pain.   Abdomen and pelvic CT with contrast on 10/30/2014 revealed no evidence of bowel obstruction or inflammation.  There were multiple stable liver cysts and hemangiomas.  There was no evidence of residual tumor.  There was prostate enlargement with bladder wall thickening.  EGD on 11/13/2014 revealed a normal esophagus, gastric ulcer with a clean base and normal duodenum. Gastric biopsy revealed oxyntic mucosa with focal moderate chronic gastritis, negative for H pylori, dysplasia, and malignancy.  Colonoscopy on 11/13/2014 revealed a poor prep. There was a medium polyp in the descending colon. Pathology revealed a tubular adenoma with no high grade dysplasia or malignancy.   Repeat colonoscopy was recommended in 5 years. Abdomen and pelvic CT scan on 08/31/2015 revealed no evidence of metastatic disease. Ferritin has been followed:  55 on 08/20/2015, 51 on 12/10/2015, 40 on 06/23/2016, and 44 on 01/20/2017.  B12 was 414 on 06/23/2016 and 485 on 08/08/2017.  INTERVAL HISTORY Patient reports feeling well today. He fell on right elbow about 3 weeks ago and is complaining of pain when he extends arm.  Otherwise no new complaints.  Drinks beer daily, average 6 beers daily.  Smokes 5-6 cigarettes daily.  Gained 15 pounds since last visit.  Denies abdominal pain, stomach discomfort, weight loss nausea or vomiting.   Past Medical History:  Diagnosis Date  . Cancer [facility]) 2012   Gastrectomy    Past Surgical History:  Procedure Laterality Date  . COLONOSCOPY WITH PROPOFOL N/A 11/13/2014   Procedure: COLONOSCOPY WITH PROPOFOL;  Surgeon: Hulen Luster, MD;  Location: [facility] ENDOSCOPY;  Service: Gastroenterology;  Laterality: N/A;  . ESOPHAGOGASTRODUODENOSCOPY (EGD) WITH PROPOFOL N/A 11/13/2014   Procedure: ESOPHAGOGASTRODUODENOSCOPY (EGD) WITH PROPOFOL;  Surgeon: Hulen Luster, MD;  Location: [facility] ENDOSCOPY;  Service: Gastroenterology;  Laterality: N/A;  . 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    History reviewed. No pertinent family history.  Social History   Socioeconomic History  . Marital status: Divorced    Spouse name: Not on file  . Number of children: Not on file  . Years of education: Not on file  . Highest education level: Not on file  Occupational History  . Not on file  Social Needs  . Financial resource strain: Not  on file  . Food insecurity:    Worry: Not on file    Inability: Not on file  . Transportation needs:    Medical: Not on file    Non-medical: Not on file  Tobacco Use  . Smoking status: Current Some Day Smoker    Packs/day: 0.25    Types: Cigarettes  . Smokeless tobacco: Never Used  Substance and  Sexual Activity  . Alcohol use: Yes    Comment: 3/4 x week  . Drug use: No  . Sexual activity: Not on file  Lifestyle  . Physical activity:    Days per week: Not on file    Minutes per session: Not on file  . Stress: Not on file  Relationships  . Social connections:    Talks on phone: Not on file    Gets together: Not on file    Attends religious service: Not on file    Active member of club or organization: Not on file    Attends meetings of clubs or organizations: Not on file    Relationship status: Not on file  . Intimate partner violence:    Fear of current or ex partner: Not on file    Emotionally abused: Not on file    Physically abused: Not on file    Forced sexual activity: Not on file  Other Topics Concern  . Not on file  Social History Narrative  . Not on file   Allergies: No Known Allergies  Current Medications: No current outpatient medications on file.   No current facility-administered medications for this visit.     Review of Systems  Constitutional: Negative for chills, fever, malaise/fatigue and weight loss.  HENT: Negative for sore throat.   Eyes: Negative for redness.  Respiratory: Negative for cough, shortness of breath and wheezing.   Cardiovascular: Negative for chest pain, palpitations and leg swelling.  Gastrointestinal: Negative for abdominal pain, blood in stool, nausea and vomiting.  Genitourinary: Negative for dysuria.  Musculoskeletal: Negative for myalgias.       Right elbow pain.  Skin: Negative for rash.  Neurological: Negative for dizziness, tingling and tremors.  Endo/Heme/Allergies: Does not bruise/bleed easily.  Psychiatric/Behavioral: Negative for hallucinations.   Performance status (ECOG): 0 - Asymptomatic  Physical Exam: Blood pressure 123/90, pulse 74, temperature (!) 96.9 F (36.1 C), temperature source Tympanic, resp. rate 18, weight 171 lb 9.6 oz (77.8 kg).  Physical Exam  Constitutional: He is oriented to person,  place, and time. No distress.  HENT:  Head: Normocephalic and atraumatic.  Nose: Nose normal.  Mouth/Throat: Oropharynx is clear and moist. No oropharyngeal exudate.  Eyes: Pupils are equal, round, and reactive to light. EOM are normal. No scleral icterus.  Neck: Normal range of motion. Neck supple.  Cardiovascular: Normal rate and regular rhythm.  No murmur heard. Pulmonary/Chest: Effort normal. No respiratory distress. He has no rales. He exhibits no tenderness.  Abdominal: Soft. He exhibits no distension. There is no abdominal tenderness.  Musculoskeletal: Normal range of motion.        General: No edema.  Neurological: He is alert and oriented to person, place, and time. No cranial nerve deficit. He exhibits normal muscle tone. Coordination normal.  Skin: Skin is warm and dry. He is not diaphoretic. No erythema.  Psychiatric: Affect normal.     Appointment on 02/16/2018  Component Date Value Ref Range Status  . Ferritin 02/16/2018 41  24 - 336 ng/mL Final   Performed at [facility], [address] [place], [place] [address]  . Vitamin B-12 02/16/2018 376  180 - 914 pg/mL Final   Comment: (NOTE) This assay is not validated for testing neonatal or myeloproliferative syndrome specimens for Vitamin B12 levels. Performed at [facility], [place] [address] [place], [place] [address]   . Sodium 02/16/2018 141  135 - 145 mmol/L Final  . Potassium 02/16/2018 4.0  3.5 - 5.1 mmol/L Final  . Chloride 02/16/2018 108  98 - 111 mmol/L Final  . CO2 02/16/2018 25  22 - 32 mmol/L Final  . Glucose, Bld 02/16/2018 117* 70 - 99 mg/dL Final  . BUN 02/16/2018 23* 6 - 20 mg/dL Final  . Creatinine, Ser 02/16/2018 1.22  0.61 - 1.24 mg/dL Final  . Calcium 02/16/2018 9.2  8.9 - 10.3 mg/dL Final  . Total Protein 02/16/2018 6.9  6.5 - 8.1 g/dL Final  . Albumin 02/16/2018 4.5  3.5 - 5.0 g/dL Final  . AST 02/16/2018 29  15 - 41 U/L Final  . ALT 02/16/2018 23  0 - 44 U/L Final  .  Alkaline Phosphatase 02/16/2018 55  38 - 126 U/L Final  . Total Bilirubin 02/16/2018 1.1  0.3 - 1.2 mg/dL Final  . GFR calc non Af Amer 02/16/2018 >60  >60 mL/min Final  . GFR calc Af Amer 02/16/2018 >60  >60 mL/min Final  . Anion gap 02/16/2018 8  5 - 15 Final   Performed at [facility], [address], [place], [place] [address]  . WBC 02/16/2018 4.3  4.0 - 10.5 K/uL Final  . RBC 02/16/2018 4.40  4.22 - 5.81 MIL/uL Final  . Hemoglobin 02/16/2018 14.0  13.0 - 17.0 g/dL Final  . HCT 02/16/2018 41.0  39.0 - 52.0 % Final  . MCV 02/16/2018 93.2  80.0 - 100.0 fL Final  . MCH 02/16/2018 31.8  26.0 - 34.0 pg Final  . MCHC 02/16/2018 34.1  30.0 - 36.0 g/dL Final  . RDW 02/16/2018 13.0  11.5 - 15.5 % Final  . Platelets 02/16/2018 202  150 - 400 K/uL Final  . nRBC 02/16/2018 0.0  0.0 - 0.2 % Final  . Neutrophils Relative % 02/16/2018 42  % Final  . Neutro Abs 02/16/2018 1.8  1.7 - 7.7 K/uL Final  . Lymphocytes Relative 02/16/2018 42  % Final  . Lymphs Abs 02/16/2018 1.8  0.7 - 4.0 K/uL Final  . Monocytes Relative 02/16/2018 12  % Final  . Monocytes Absolute 02/16/2018 0.5  0.1 - 1.0 K/uL Final  . Eosinophils Relative 02/16/2018 3  % Final  . Eosinophils Absolute 02/16/2018 0.1  0.0 - 0.5 K/uL Final  . Basophils Relative 02/16/2018 1  % Final  . Basophils Absolute 02/16/2018 0.0  0.0 - 0.1 K/uL Final  . Immature Granulocytes 02/16/2018 0  % Final  . Abs Immature Granulocytes 02/16/2018 0.01  0.00 - 0.07 K/uL Final   Performed at [facility], [address] [place], [place] [address]   RADIOGRAPHIC STUDIES: I have personally reviewed the radiological images as listed and agreed with the findings in the report. 08/31/2015 CT abdomen pelvis w contrast No evidence of recurrent or metastatic carcinoma within the abdomen or pelvis. No other acute findings identified. Stable benign hepatic cysts and hemangioma. Stable benign bilateral renal cysts and parenchymal scarring. Stable  mildly enlarged prostate with median lobe hypertrophy.  Assessment:  Andrew Peters is a 52 y.o. male old African American gentleman with stage IB gastric carcinoma.  1. Malignant neoplasm of overlapping sites of stomach ([place])   2. Regular alcohol consumption   3. Tobacco abuse    # History of Stage IB gastric carcinoma.  Clinically doing well, no GI symptoms.  Recommend continuing follow up ever 6 months . EGD and CT scans will be performed as clinically indicated.  Will monitor B12, iron panel given his gastric resection  # alcohol cessation discussed with patient. Smoke cessation discussed with patient.  # RTC in 6 months. for MD assessment and labs (CBC with diff, CMP, ferritin, B12).  Earlie Server, MD, PhD Hematology Oncology [facility] Pager- [phone] 02/17/2018

## 2018-02-16 NOTE — Progress Notes (Signed)
Patient here for follow up. Pt fell on right elbow about 3 weeks ago and is complaining of pain when he extends arm.

## 2018-02-25 ENCOUNTER — Other Ambulatory Visit: Payer: Self-pay

## 2018-02-25 ENCOUNTER — Emergency Department: Payer: Medicaid Other

## 2018-02-25 ENCOUNTER — Emergency Department
Admission: EM | Admit: 2018-02-25 | Discharge: 2018-02-25 | Disposition: A | Discharge status: Home / Self Care | Payer: Medicaid Other | Attending: Emergency Medicine | Admitting: Emergency Medicine

## 2018-02-25 DIAGNOSIS — S01312A Laceration without foreign body of left ear, initial encounter: Secondary | ICD-10-CM | POA: Diagnosis not present

## 2018-02-25 DIAGNOSIS — W503XXA Accidental bite by another person, initial encounter: Secondary | ICD-10-CM

## 2018-02-25 DIAGNOSIS — S0101XA Laceration without foreign body of scalp, initial encounter: Secondary | ICD-10-CM | POA: Diagnosis not present

## 2018-02-25 DIAGNOSIS — M7711 Lateral epicondylitis, right elbow: Secondary | ICD-10-CM | POA: Diagnosis not present

## 2018-02-25 DIAGNOSIS — Y9289 Other specified places as the place of occurrence of the external cause: Secondary | ICD-10-CM | POA: Insufficient documentation

## 2018-02-25 DIAGNOSIS — Y998 Other external cause status: Secondary | ICD-10-CM | POA: Diagnosis not present

## 2018-02-25 DIAGNOSIS — Z23 Encounter for immunization: Secondary | ICD-10-CM | POA: Diagnosis not present

## 2018-02-25 DIAGNOSIS — Y9389 Activity, other specified: Secondary | ICD-10-CM | POA: Diagnosis not present

## 2018-02-25 DIAGNOSIS — F17210 Nicotine dependence, cigarettes, uncomplicated: Secondary | ICD-10-CM | POA: Insufficient documentation

## 2018-02-25 DIAGNOSIS — S59901A Unspecified injury of right elbow, initial encounter: Secondary | ICD-10-CM | POA: Diagnosis not present

## 2018-02-25 DIAGNOSIS — M25521 Pain in right elbow: Secondary | ICD-10-CM | POA: Diagnosis not present

## 2018-02-25 MED ORDER — HYDROMORPHONE HCL 1 MG/ML IJ SOLN
INTRAMUSCULAR | Status: AC
  Filled 2018-02-25: qty 2

## 2018-02-25 MED ORDER — IBUPROFEN 600 MG PO TABS: 600.0000 mg | ORAL_TABLET | Freq: Three times a day (TID) | ORAL | 0 refills | Status: DC | PRN

## 2018-02-25 MED ORDER — IBUPROFEN 600 MG PO TABS
600.0000 mg | ORAL_TABLET | Freq: Once | ORAL | Status: AC
  Administered 2018-02-25: 600 mg via ORAL
  Filled 2018-02-25: qty 1

## 2018-02-25 MED ORDER — AMOXICILLIN-POT CLAVULANATE 875-125 MG PO TABS: 1.0000 | ORAL_TABLET | Freq: Two times a day (BID) | ORAL | 0 refills | Status: AC

## 2018-02-25 MED ORDER — LIDOCAINE-EPINEPHRINE 2 %-1:100000 IJ SOLN: 20.0000 mL | Freq: Once | INTRAMUSCULAR | Status: DC

## 2018-02-25 MED ORDER — HYDROMORPHONE HCL 1 MG/ML IJ SOLN
2.0000 mg | Freq: Once | INTRAMUSCULAR | Status: AC
  Administered 2018-02-25: 2 mg via INTRAMUSCULAR
  Filled 2018-02-25: qty 2

## 2018-02-25 MED ORDER — HYDROCODONE-ACETAMINOPHEN 5-325 MG PO TABS: 1.0000 | ORAL_TABLET | Freq: Four times a day (QID) | ORAL | 0 refills | Status: DC | PRN

## 2018-02-25 MED ORDER — HYDROMORPHONE HCL 1 MG/ML IJ SOLN
2.0000 mg | Freq: Once | INTRAMUSCULAR | Status: AC
  Administered 2018-02-25: 2 mg via INTRAVENOUS
  Filled 2018-02-25: qty 2

## 2018-02-25 MED ORDER — AMOXICILLIN-POT CLAVULANATE 875-125 MG PO TABS
1.0000 | ORAL_TABLET | Freq: Once | ORAL | Status: AC
  Administered 2018-02-25: 1 via ORAL
  Filled 2018-02-25: qty 1

## 2018-02-25 MED ORDER — TETANUS-DIPHTH-ACELL PERTUSSIS 5-2.5-18.5 LF-MCG/0.5 IM SUSP
0.5000 mL | Freq: Once | INTRAMUSCULAR | Status: AC
  Administered 2018-02-25: 0.5 mL via INTRAMUSCULAR
  Filled 2018-02-25: qty 0.5

## 2018-02-25 MED ORDER — BUPIVACAINE HCL (PF) 0.5 % IJ SOLN
30.0000 mL | Freq: Once | INTRAMUSCULAR | Status: AC
  Administered 2018-02-25: 30 mL
  Filled 2018-02-25: qty 30

## 2018-02-25 NOTE — ED Notes (Signed)
No peripheral IV placed this visit.   Discharge instructions reviewed with patient. Questions fielded by this RN. Patient verbalizes understanding of instructions. Patient discharged home in stable condition per rifenbark. No acute distress noted at time of discharge.
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
## 2018-02-25 NOTE — ED Notes (Signed)
ED Provider at bedside suturing pt 
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
## 2018-03-02 ENCOUNTER — Emergency Department
Admission: EM | Admit: 2018-03-02 | Discharge: 2018-03-02 | Disposition: A | Discharge status: Home / Self Care | Payer: Medicaid Other | Attending: Emergency Medicine | Admitting: Emergency Medicine

## 2018-03-02 ENCOUNTER — Encounter: Payer: Self-pay | Admitting: Intensive Care

## 2018-03-02 ENCOUNTER — Other Ambulatory Visit: Payer: Self-pay

## 2018-03-02 DIAGNOSIS — F17210 Nicotine dependence, cigarettes, uncomplicated: Secondary | ICD-10-CM | POA: Diagnosis not present

## 2018-03-02 DIAGNOSIS — Z79899 Other long term (current) drug therapy: Secondary | ICD-10-CM | POA: Insufficient documentation

## 2018-03-02 DIAGNOSIS — Z4802 Encounter for removal of sutures: Secondary | ICD-10-CM | POA: Diagnosis present

## 2018-03-02 DIAGNOSIS — Z859 Personal history of malignant neoplasm, unspecified: Secondary | ICD-10-CM | POA: Diagnosis not present

## 2018-03-02 NOTE — ED Notes (Signed)
Patient verbalized understanding of discharge instructions, no questions. Patient ambulated out of ED with steady gait in no distress.  

## 2018-03-02 NOTE — ED Triage Notes (Signed)
Patient is here for suture removal on L ear
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
## 2018-03-02 NOTE — ED Notes (Signed)
See triage note  State he is here for suture removal  Sutures intact left ear  No drainage noted

## 2018-08-15 ENCOUNTER — Other Ambulatory Visit: Payer: Self-pay

## 2018-08-15 ENCOUNTER — Encounter: Payer: Self-pay | Admitting: Oncology

## 2018-08-15 ENCOUNTER — Inpatient Hospital Stay: Payer: Medicaid Other | Attending: Oncology

## 2018-08-15 ENCOUNTER — Inpatient Hospital Stay (HOSPITAL_BASED_OUTPATIENT_CLINIC_OR_DEPARTMENT_OTHER): Payer: Medicaid Other | Admitting: Oncology

## 2018-08-15 VITALS — BP 125/88 | HR 75 | Temp 97.3°F | Resp 18 | Wt 176.8 lb

## 2018-08-15 DIAGNOSIS — R3914 Feeling of incomplete bladder emptying: Secondary | ICD-10-CM | POA: Diagnosis not present

## 2018-08-15 DIAGNOSIS — Z720 Tobacco use: Secondary | ICD-10-CM

## 2018-08-15 DIAGNOSIS — R3911 Hesitancy of micturition: Secondary | ICD-10-CM | POA: Diagnosis not present

## 2018-08-15 DIAGNOSIS — R1030 Lower abdominal pain, unspecified: Secondary | ICD-10-CM

## 2018-08-15 DIAGNOSIS — F17210 Nicotine dependence, cigarettes, uncomplicated: Secondary | ICD-10-CM | POA: Diagnosis not present

## 2018-08-15 DIAGNOSIS — F1010 Alcohol abuse, uncomplicated: Secondary | ICD-10-CM | POA: Insufficient documentation

## 2018-08-15 DIAGNOSIS — Z9049 Acquired absence of other specified parts of digestive tract: Secondary | ICD-10-CM

## 2018-08-15 DIAGNOSIS — Z789 Other specified health status: Secondary | ICD-10-CM

## 2018-08-15 DIAGNOSIS — C168 Malignant neoplasm of overlapping sites of stomach: Secondary | ICD-10-CM | POA: Insufficient documentation

## 2018-08-15 LAB — COMPREHENSIVE METABOLIC PANEL
ALT: 20 U/L (ref 0–44)
AST: 21 U/L (ref 15–41)
Albumin: 4.5 g/dL (ref 3.5–5.0)
Alkaline Phosphatase: 58 U/L (ref 38–126)
Anion gap: 9 (ref 5–15)
BUN: 22 mg/dL — ABNORMAL HIGH (ref 6–20)
CO2: 27 mmol/L (ref 22–32)
Calcium: 9.1 mg/dL (ref 8.9–10.3)
Chloride: 103 mmol/L (ref 98–111)
Creatinine, Ser: 1.36 mg/dL — ABNORMAL HIGH (ref 0.61–1.24)
GFR calc Af Amer: >60 mL/min (ref >60)
GFR calc non Af Amer: >60 mL/min (ref >60)
Glucose, Bld: 114 mg/dL — ABNORMAL HIGH (ref 70–99)
Potassium: 4 mmol/L (ref 3.5–5.1)
Sodium: 139 mmol/L (ref 135–145)
Total Bilirubin: 1.1 mg/dL (ref 0.3–1.2)
Total Protein: 6.9 g/dL (ref 6.5–8.1)

## 2018-08-15 LAB — CBC WITH DIFFERENTIAL/PLATELET
Abs Immature Granulocytes: 0.01 10*3/uL (ref 0.00–0.07)
Basophils Absolute: 0 10*3/uL (ref 0.0–0.1)
Basophils Relative: 1 %
Eosinophils Absolute: 0 10*3/uL (ref 0.0–0.5)
Eosinophils Relative: 0 %
HCT: 41.7 % (ref 39.0–52.0)
Hemoglobin: 14.7 g/dL (ref 13.0–17.0)
Immature Granulocytes: 0 %
Lymphocytes Relative: 42 %
Lymphs Abs: 2 10*3/uL (ref 0.7–4.0)
MCH: 31.9 pg (ref 26.0–34.0)
MCHC: 35.3 g/dL (ref 30.0–36.0)
MCV: 90.5 fL (ref 80.0–100.0)
Monocytes Absolute: 0.6 10*3/uL (ref 0.1–1.0)
Monocytes Relative: 12 %
Neutro Abs: 2.2 10*3/uL (ref 1.7–7.7)
Neutrophils Relative %: 45 %
Platelets: 185 10*3/uL (ref 150–400)
RBC: 4.61 MIL/uL (ref 4.22–5.81)
RDW: 12.5 % (ref 11.5–15.5)
WBC: 4.8 10*3/uL (ref 4.0–10.5)
nRBC: 0 % (ref 0.0–0.2)

## 2018-08-15 LAB — FERRITIN: Ferritin: 84 ng/mL (ref 24–336)

## 2018-08-15 LAB — VITAMIN B12: Vitamin B-12: 386 pg/mL (ref 180–914)

## 2018-08-15 NOTE — Progress Notes (Signed)
Patient here for follow up. He states he has been having occasional  abdominal pain and strains when he voids.

## 2018-08-16 ENCOUNTER — Other Ambulatory Visit: Payer: Medicaid Other

## 2018-08-16 ENCOUNTER — Ambulatory Visit: Payer: Medicaid Other | Admitting: Oncology

## 2018-08-16 NOTE — Progress Notes (Signed)
[facility] day:  08/16/18   Chief Complaint: Andrew Peters is a 53 y.o. male with stage IB gastric cancer who is seen for 6 month assessment.  Andrew Peters is a 53 y.o.[place] who has above oncology history reviewed by me today presented for follow up visit for management of Stage IB gastric carcinoma. Patient's previous oncology care was with Dr.Corcoran who switches practice to [place]. Patient starts to follow up with me on 02/16/2018.  Reviewed patient's previous oncology records.   He presented in 08/2011 with an 8 month history of epigastric pain, early satiety, anorexia, and hematemesis.  Upper endoscopy on 08/19/2011 revealed a large gastric antral ulcer.  Biopsies demonstrated chronic active gastritis with Helicobacter pylori as well as intramucosal adenocarcinoma.  PET scan did not reveal any disease outside of the stomach.    He underwent partial distal gastrectomy on 10/04/2011.  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  Margins were negative. Nine lymph nodes were negative.  Pathologic stage was T2N0M0.  CEA has ranged between 4 and 5.  He developed hematochezia and LUQ abdominal pain.   Abdomen and pelvic CT with contrast on 10/30/2014 revealed no evidence of bowel obstruction or inflammation.  There were multiple stable liver cysts and hemangiomas.  There was no evidence of residual tumor.  There was prostate enlargement with bladder wall thickening.  EGD on 11/13/2014 revealed a normal esophagus, gastric ulcer with a clean base and normal duodenum. Gastric biopsy revealed oxyntic mucosa with focal moderate chronic gastritis, negative for H pylori, dysplasia, and malignancy.  Colonoscopy on 11/13/2014 revealed a poor prep. There was a medium polyp in the descending colon. Pathology revealed a tubular adenoma with no high grade dysplasia or malignancy.   Repeat colonoscopy was recommended in 5 years. Abdomen and pelvic CT scan on 08/31/2015 revealed no evidence of metastatic disease. Ferritin has been followed:  55 on 08/20/2015, 51 on 12/10/2015, 40 on 06/23/2016, and 44 on 01/20/2017.  B12 was 414 on 06/23/2016 and 485 on 08/08/2017.  INTERVAL HISTORY 53 y.o. male with remote history of stage I gastric cancer presents for follow-up visit. Patient reports having persistent lower abdomen pain, no exacerbating or alleviating factors.  Pain started a few weeks ago. Also felt that he has hesitancy of urination and felt bladder is not fully emptied. Continues to smoke daily. Appetite is fine.  Denies any shortness of breath, chest pain, dysuria, unintentional weight loss.  Denies any epigastric pain.  No nausea or vomiting He gained 5 pounds since last visit.   Past Medical History:  Diagnosis Date  . Cancer [name]) 2012   Gastrectomy    Past Surgical History:  Procedure Laterality Date  . COLONOSCOPY WITH PROPOFOL N/A 11/13/2014   Procedure: COLONOSCOPY WITH PROPOFOL;  Surgeon: Hulen Luster, MD;  Location: [facility] ENDOSCOPY;  Service: Gastroenterology;  Laterality: N/A;  . ESOPHAGOGASTRODUODENOSCOPY (EGD) WITH PROPOFOL N/A 11/13/2014   Procedure: ESOPHAGOGASTRODUODENOSCOPY (EGD) WITH PROPOFOL;  Surgeon: Hulen Luster, MD;  Location: [facility] ENDOSCOPY;  Service: Gastroenterology;  Laterality: N/A;  . 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    History reviewed. No pertinent family history.  Social History   Socioeconomic History  . Marital status: Divorced    Spouse name: Not on file  . Number of children: Not on file  . Years of education: Not on  file  . Highest education level: Not on file  Occupational History  . Not on file  Social Needs  . Financial resource strain: Not on file  . Food insecurity    Worry: Not on file    Inability: Not on file  . Transportation needs    Medical: Not on file     Non-medical: Not on file  Tobacco Use  . Smoking status: Current Some Day Smoker    Packs/day: 0.25    Types: Cigarettes  . Smokeless tobacco: Never Used  Substance and Sexual Activity  . Alcohol use: Yes    Alcohol/week: 42.0 standard drinks    Types: 42 Cans of beer per week    Comment: 3/4 x week  . Drug use: No  . Sexual activity: Not on file  Lifestyle  . Physical activity    Days per week: Not on file    Minutes per session: Not on file  . Stress: Not on file  Relationships  . Social Herbalist on phone: Not on file    Gets together: Not on file    Attends religious service: Not on file    Active member of club or organization: Not on file    Attends meetings of clubs or organizations: Not on file    Relationship status: Not on file  . Intimate partner violence    Fear of current or ex partner: Not on file    Emotionally abused: Not on file    Physically abused: Not on file    Forced sexual activity: Not on file  Other Topics Concern  . Not on file  Social History Narrative  . Not on file   Allergies: No Known Allergies  Current Medications: Current Outpatient Medications  Medication Sig Dispense Refill  . HYDROcodone-acetaminophen (NORCO) 5-325 MG tablet Take 1 tablet by mouth every 6 (six) hours as needed for up to 7 doses for severe pain. 7 tablet 0  . ibuprofen (ADVIL,MOTRIN) 600 MG tablet Take 1 tablet (600 mg total) by mouth every 8 (eight) hours as needed. 30 tablet 0   No current facility-administered medications for this visit.     Review of Systems  Constitutional: Negative for chills, fever, malaise/fatigue and weight loss.  HENT: Negative for sore throat.   Eyes: Negative for redness.  Respiratory: Negative for cough, shortness of breath and wheezing.   Cardiovascular: Negative for chest pain, palpitations and leg swelling.  Gastrointestinal: Negative for abdominal pain, blood in stool, nausea and vomiting.  Genitourinary: Negative for  dysuria.  Musculoskeletal: Negative for myalgias.       Right elbow pain.  Skin: Negative for rash.  Neurological: Negative for dizziness, tingling and tremors.  Endo/Heme/Allergies: Does not bruise/bleed easily.  Psychiatric/Behavioral: Negative for hallucinations.   Performance status (ECOG): 1 - Symptomatic but completely ambulatory  Physical Exam: Blood pressure 125/88, pulse 75, temperature (!) 97.3 F (36.3 C), resp. rate 18, weight 176 lb 12.8 oz (80.2 kg).  Physical Exam  Constitutional: He is oriented to person, place, and time. No distress.  HENT:  Head: Normocephalic and atraumatic.  Nose: Nose normal.  Mouth/Throat: Oropharynx is clear and moist. No oropharyngeal exudate.  Eyes: Pupils are equal, round, and reactive to light. EOM are normal. No scleral icterus.  Neck: Normal range of motion. Neck supple.  Cardiovascular: Normal rate and regular rhythm.  No murmur heard. Pulmonary/Chest: Effort normal. No respiratory distress. He has no rales. He exhibits no tenderness.  Abdominal: Soft.  He exhibits no distension. There is no abdominal tenderness.  Lower abdominal pain  Musculoskeletal: Normal range of motion.        General: No edema.  Neurological: He is alert and oriented to person, place, and time. No cranial nerve deficit. He exhibits normal muscle tone. Coordination normal.  Skin: Skin is warm and dry. No erythema.  Psychiatric: Affect normal.     Appointment on 08/15/2018  Component Date Value Ref Range Status  . Sodium 08/15/2018 139  135 - 145 mmol/L Final  . Potassium 08/15/2018 4.0  3.5 - 5.1 mmol/L Final  . Chloride 08/15/2018 103  98 - 111 mmol/L Final  . CO2 08/15/2018 27  22 - 32 mmol/L Final  . Glucose, Bld 08/15/2018 114* 70 - 99 mg/dL Final  . BUN 08/15/2018 22* 6 - 20 mg/dL Final  . Creatinine, Ser 08/15/2018 1.36* 0.61 - 1.24 mg/dL Final  . Calcium 08/15/2018 9.1  8.9 - 10.3 mg/dL Final  . Total Protein 08/15/2018 6.9  6.5 - 8.1 g/dL Final   . Albumin 08/15/2018 4.5  3.5 - 5.0 g/dL Final  . AST 08/15/2018 21  15 - 41 U/L Final  . ALT 08/15/2018 20  0 - 44 U/L Final  . Alkaline Phosphatase 08/15/2018 58  38 - 126 U/L Final  . Total Bilirubin 08/15/2018 1.1  0.3 - 1.2 mg/dL Final  . GFR calc non Af Amer 08/15/2018 >60  >60 mL/min Final  . GFR calc Af Amer 08/15/2018 >60  >60 mL/min Final  . Anion gap 08/15/2018 9  5 - 15 Final   Performed at [facility], [address] [place], [place] [address]  . WBC 08/15/2018 4.8  4.0 - 10.5 K/uL Final  . RBC 08/15/2018 4.61  4.22 - 5.81 MIL/uL Final  . Hemoglobin 08/15/2018 14.7  13.0 - 17.0 g/dL Final  . HCT 08/15/2018 41.7  39.0 - 52.0 % Final  . MCV 08/15/2018 90.5  80.0 - 100.0 fL Final  . MCH 08/15/2018 31.9  26.0 - 34.0 pg Final  . MCHC 08/15/2018 35.3  30.0 - 36.0 g/dL Final  . RDW 08/15/2018 12.5  11.5 - 15.5 % Final  . Platelets 08/15/2018 185  150 - 400 K/uL Final  . nRBC 08/15/2018 0.0  0.0 - 0.2 % Final  . Neutrophils Relative % 08/15/2018 45  % Final  . Neutro Abs 08/15/2018 2.2  1.7 - 7.7 K/uL Final  . Lymphocytes Relative 08/15/2018 42  % Final  . Lymphs Abs 08/15/2018 2.0  0.7 - 4.0 K/uL Final  . Monocytes Relative 08/15/2018 12  % Final  . Monocytes Absolute 08/15/2018 0.6  0.1 - 1.0 K/uL Final  . Eosinophils Relative 08/15/2018 0  % Final  . Eosinophils Absolute 08/15/2018 0.0  0.0 - 0.5 K/uL Final  . Basophils Relative 08/15/2018 1  % Final  . Basophils Absolute 08/15/2018 0.0  0.0 - 0.1 K/uL Final  . Immature Granulocytes 08/15/2018 0  % Final  . Abs Immature Granulocytes 08/15/2018 0.01  0.00 - 0.07 K/uL Final   Performed at [facility], [address] [place], [place] [address]  . Ferritin 08/15/2018 84  24 - 336 ng/mL Final   Performed at [facility], [address] [place], [place] [address]  . Vitamin B-12 08/15/2018 386  180 - 914 pg/mL Final   Comment: (NOTE) This assay is not validated for testing neonatal  or myeloproliferative syndrome specimens for Vitamin B12 levels. Performed at [facility], [place] [address] [place], [place] [address]    RADIOGRAPHIC STUDIES: I have personally reviewed the radiological images as listed and agreed with the findings in the report. 08/31/2015 CT abdomen pelvis w contrast No evidence of recurrent or metastatic carcinoma within the abdomen or pelvis. No other acute findings identified. Stable benign hepatic cysts and hemangioma. Stable benign bilateral renal cysts and parenchymal scarring. Stable mildly enlarged prostate with median lobe hypertrophy.  Assessment:  Andrew Peters is a 53 y.o. male old African American gentleman with stage IB gastric carcinoma.    1. Malignant neoplasm of overlapping sites of stomach ([place])   2. Lower abdominal pain   3. Feeling of incomplete bladder emptying   4. Regular alcohol consumption   5. Tobacco abuse    # History of Stage IB gastric carcinoma.  New onset of lower abdominal pain Last CT scan was in 2017. With his history of gastric cancer, I think it is reasonable to repeat CT abdomen and pelvis for further evaluation.  #Feeling of unable to empty bladder, urination hesitancy These are new problems for him. Discussed with patient that I think he is experiencing prostate enlargement symptoms. Patient does not have any primary care physician.  Prefers to establish care with PCP Dr.Linthavong.  I will refer  History of B12 deficiency, history of gastrectomy, B12 has been followed level is 386.  Continue to monitor.   Continue to smoke and drink alcohol.  Smoking cessation and alcohol cessation were discussed.  # RTC in 3 months. for MD assessment and labs (CBC with diff, CMP, ferritin, B12). Orders Placed This Encounter  Procedures  . CT Abdomen Pelvis W Contrast    Standing Status:   Future    Standing Expiration Date:   08/15/2019    Order Specific Question:   ** REASON FOR EXAM (FREE TEXT)     Answer:   abdominal pain. history of gastric cancer    Order Specific Question:   If indicated for the ordered procedure, I authorize the administration of contrast media per Radiology protocol    Answer:   Yes    Order Specific Question:   Preferred imaging location?    Answer:   [facility]    Order Specific Question:   Is Oral Contrast requested for this exam?    Answer:   Yes, Per Radiology protocol    Order Specific Question:   Radiology Contrast Protocol - do NOT remove file path    Answer:   [email or web address]  . CBC with Differential/Platelet    Standing Status:   Future    Standing Expiration Date:   08/15/2019  . Comprehensive metabolic panel    Standing Status:   Future    Standing Expiration Date:   08/15/2019  . Vitamin B12    Standing Status:   Future    Standing Expiration Date:   02/15/2019  . Ferritin    Standing Status:   Future    Standing Expiration Date:   08/15/2019  . Folate    Standing Status:   Future    Standing Expiration Date:   08/16/2019    Earlie Server, MD, PhD Hematology Oncology [facility], [place] at [facility] Pager- [phone] 08/16/2018

## 2018-08-22 ENCOUNTER — Other Ambulatory Visit: Payer: Self-pay

## 2018-08-22 ENCOUNTER — Ambulatory Visit
Admission: RE | Admit: 2018-08-22 | Discharge: 2018-08-22 | Disposition: A | Discharge status: Home / Self Care | Payer: Medicaid Other | Source: Ambulatory Visit | Attending: Oncology | Admitting: Oncology

## 2018-08-22 DIAGNOSIS — C168 Malignant neoplasm of overlapping sites of stomach: Secondary | ICD-10-CM | POA: Diagnosis not present

## 2018-08-22 DIAGNOSIS — D1809 Hemangioma of other sites: Secondary | ICD-10-CM | POA: Diagnosis not present

## 2018-08-22 DIAGNOSIS — C169 Malignant neoplasm of stomach, unspecified: Secondary | ICD-10-CM | POA: Diagnosis not present

## 2018-08-22 MED ORDER — IOHEXOL 300 MG/ML  SOLN
100.0000 mL | Freq: Once | INTRAMUSCULAR | Status: AC | PRN
  Administered 2018-08-22: 100 mL via INTRAVENOUS

## 2018-09-05 ENCOUNTER — Emergency Department: Payer: No Typology Code available for payment source

## 2018-09-05 ENCOUNTER — Other Ambulatory Visit: Payer: Self-pay

## 2018-09-05 ENCOUNTER — Encounter: Payer: Self-pay | Admitting: Emergency Medicine

## 2018-09-05 ENCOUNTER — Emergency Department
Admission: EM | Admit: 2018-09-05 | Discharge: 2018-09-05 | Disposition: A | Discharge status: Home / Self Care | Payer: No Typology Code available for payment source | Attending: Emergency Medicine | Admitting: Emergency Medicine

## 2018-09-05 DIAGNOSIS — S199XXA Unspecified injury of neck, initial encounter: Secondary | ICD-10-CM | POA: Diagnosis not present

## 2018-09-05 DIAGNOSIS — Y92410 Unspecified street and highway as the place of occurrence of the external cause: Secondary | ICD-10-CM | POA: Diagnosis not present

## 2018-09-05 DIAGNOSIS — F17210 Nicotine dependence, cigarettes, uncomplicated: Secondary | ICD-10-CM | POA: Insufficient documentation

## 2018-09-05 DIAGNOSIS — Y998 Other external cause status: Secondary | ICD-10-CM | POA: Diagnosis not present

## 2018-09-05 DIAGNOSIS — Y9389 Activity, other specified: Secondary | ICD-10-CM | POA: Insufficient documentation

## 2018-09-05 DIAGNOSIS — S161XXA Strain of muscle, fascia and tendon at neck level, initial encounter: Secondary | ICD-10-CM | POA: Insufficient documentation

## 2018-09-05 DIAGNOSIS — M542 Cervicalgia: Secondary | ICD-10-CM | POA: Diagnosis not present

## 2018-09-05 DIAGNOSIS — Z85028 Personal history of other malignant neoplasm of stomach: Secondary | ICD-10-CM | POA: Insufficient documentation

## 2018-09-05 MED ORDER — CYCLOBENZAPRINE HCL 5 MG PO TABS: 5.0000 mg | ORAL_TABLET | Freq: Three times a day (TID) | ORAL | 0 refills | Status: AC | PRN

## 2018-09-05 MED ORDER — TRAMADOL HCL 50 MG PO TABS: 50.0000 mg | ORAL_TABLET | Freq: Three times a day (TID) | ORAL | 0 refills | Status: AC | PRN

## 2018-09-05 MED ORDER — IBUPROFEN 800 MG PO TABS: 800.0000 mg | ORAL_TABLET | Freq: Three times a day (TID) | ORAL | 0 refills | Status: AC | PRN
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
## 2018-09-05 NOTE — Discharge Instructions (Signed)
Your exam and XR are negative for any fracture or dislocation. Follow-up with your provider for ongoing symptoms. Return to the ED as needed.
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
## 2018-10-16 ENCOUNTER — Telehealth: Payer: Self-pay | Admitting: *Deleted

## 2018-10-16 NOTE — Telephone Encounter (Signed)
He should call his PCP' office. Thanks.

## 2018-10-16 NOTE — Telephone Encounter (Signed)
Patient called reporting that he has a painful knot in his scrotum and would like to be evaluated for it. Please advise

## 2018-10-16 NOTE — Telephone Encounter (Signed)
Patient informed of Dr Collie Siad response and phone number given to patient for Dr Netty Starring

## 2018-11-14 ENCOUNTER — Other Ambulatory Visit: Payer: Self-pay

## 2018-11-14 NOTE — Progress Notes (Signed)
Patient pre screened for office appointment, no questions or concerns today. 

## 2018-11-15 ENCOUNTER — Encounter: Payer: Self-pay | Admitting: Oncology

## 2018-11-15 ENCOUNTER — Inpatient Hospital Stay (HOSPITAL_BASED_OUTPATIENT_CLINIC_OR_DEPARTMENT_OTHER): Payer: Medicaid Other | Admitting: Oncology

## 2018-11-15 ENCOUNTER — Other Ambulatory Visit: Payer: Self-pay

## 2018-11-15 ENCOUNTER — Inpatient Hospital Stay: Payer: Medicaid Other | Attending: Oncology

## 2018-11-15 VITALS — BP 129/95 | HR 72 | Temp 95.4°F | Resp 18 | Wt 179.4 lb

## 2018-11-15 DIAGNOSIS — Z789 Other specified health status: Secondary | ICD-10-CM | POA: Diagnosis not present

## 2018-11-15 DIAGNOSIS — F1010 Alcohol abuse, uncomplicated: Secondary | ICD-10-CM | POA: Diagnosis not present

## 2018-11-15 DIAGNOSIS — R3914 Feeling of incomplete bladder emptying: Secondary | ICD-10-CM

## 2018-11-15 DIAGNOSIS — E538 Deficiency of other specified B group vitamins: Secondary | ICD-10-CM | POA: Insufficient documentation

## 2018-11-15 DIAGNOSIS — C169 Malignant neoplasm of stomach, unspecified: Secondary | ICD-10-CM | POA: Diagnosis present

## 2018-11-15 DIAGNOSIS — C168 Malignant neoplasm of overlapping sites of stomach: Secondary | ICD-10-CM

## 2018-11-15 DIAGNOSIS — N5089 Other specified disorders of the male genital organs: Secondary | ICD-10-CM | POA: Diagnosis not present

## 2018-11-15 DIAGNOSIS — R1030 Lower abdominal pain, unspecified: Secondary | ICD-10-CM

## 2018-11-15 DIAGNOSIS — F17210 Nicotine dependence, cigarettes, uncomplicated: Secondary | ICD-10-CM | POA: Diagnosis not present

## 2018-11-15 DIAGNOSIS — Z720 Tobacco use: Secondary | ICD-10-CM

## 2018-11-15 LAB — COMPREHENSIVE METABOLIC PANEL
ALT: 24 U/L (ref 0–44)
AST: 26 U/L (ref 15–41)
Albumin: 4.9 g/dL (ref 3.5–5.0)
Alkaline Phosphatase: 57 U/L (ref 38–126)
Anion gap: 8 (ref 5–15)
BUN: 28 mg/dL — ABNORMAL HIGH (ref 6–20)
CO2: 23 mmol/L (ref 22–32)
Calcium: 9.2 mg/dL (ref 8.9–10.3)
Chloride: 106 mmol/L (ref 98–111)
Creatinine, Ser: 1.55 mg/dL — ABNORMAL HIGH (ref 0.61–1.24)
GFR calc Af Amer: 59 mL/min — ABNORMAL LOW (ref >60)
GFR calc non Af Amer: 51 mL/min — ABNORMAL LOW (ref >60)
Glucose, Bld: 102 mg/dL — ABNORMAL HIGH (ref 70–99)
Potassium: 3.6 mmol/L (ref 3.5–5.1)
Sodium: 137 mmol/L (ref 135–145)
Total Bilirubin: 0.8 mg/dL (ref 0.3–1.2)
Total Protein: 7.2 g/dL (ref 6.5–8.1)

## 2018-11-15 LAB — CBC WITH DIFFERENTIAL/PLATELET
Abs Immature Granulocytes: 0 10*3/uL (ref 0.00–0.07)
Basophils Absolute: 0 10*3/uL (ref 0.0–0.1)
Basophils Relative: 1 %
Eosinophils Absolute: 0 10*3/uL (ref 0.0–0.5)
Eosinophils Relative: 0 %
HCT: 44.2 % (ref 39.0–52.0)
Hemoglobin: 15.6 g/dL (ref 13.0–17.0)
Immature Granulocytes: 0 %
Lymphocytes Relative: 46 %
Lymphs Abs: 2.6 10*3/uL (ref 0.7–4.0)
MCH: 32 pg (ref 26.0–34.0)
MCHC: 35.3 g/dL (ref 30.0–36.0)
MCV: 90.8 fL (ref 80.0–100.0)
Monocytes Absolute: 0.7 10*3/uL (ref 0.1–1.0)
Monocytes Relative: 13 %
Neutro Abs: 2.2 10*3/uL (ref 1.7–7.7)
Neutrophils Relative %: 40 %
Platelets: 222 10*3/uL (ref 150–400)
RBC: 4.87 MIL/uL (ref 4.22–5.81)
RDW: 12.5 % (ref 11.5–15.5)
WBC: 5.5 10*3/uL (ref 4.0–10.5)
nRBC: 0 % (ref 0.0–0.2)

## 2018-11-15 LAB — FERRITIN: Ferritin: 48 ng/mL (ref 24–336)

## 2018-11-15 LAB — FOLATE: Folate: 13.5 ng/mL (ref >5.9)

## 2018-11-15 LAB — VITAMIN B12: Vitamin B-12: 381 pg/mL (ref 180–914)
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
## 2018-12-15 ENCOUNTER — Emergency Department
Admission: EM | Admit: 2018-12-15 | Discharge: 2018-12-15 | Disposition: A | Discharge status: Home / Self Care | Payer: Medicaid Other | Attending: Emergency Medicine | Admitting: Emergency Medicine

## 2018-12-15 ENCOUNTER — Other Ambulatory Visit: Payer: Self-pay

## 2018-12-15 ENCOUNTER — Encounter: Payer: Self-pay | Admitting: Emergency Medicine

## 2018-12-15 ENCOUNTER — Emergency Department: Payer: Medicaid Other

## 2018-12-15 DIAGNOSIS — S6992XA Unspecified injury of left wrist, hand and finger(s), initial encounter: Secondary | ICD-10-CM | POA: Diagnosis not present

## 2018-12-15 DIAGNOSIS — Y929 Unspecified place or not applicable: Secondary | ICD-10-CM | POA: Diagnosis not present

## 2018-12-15 DIAGNOSIS — F17210 Nicotine dependence, cigarettes, uncomplicated: Secondary | ICD-10-CM | POA: Insufficient documentation

## 2018-12-15 DIAGNOSIS — Y939 Activity, unspecified: Secondary | ICD-10-CM | POA: Insufficient documentation

## 2018-12-15 DIAGNOSIS — S63502A Unspecified sprain of left wrist, initial encounter: Secondary | ICD-10-CM | POA: Insufficient documentation

## 2018-12-15 DIAGNOSIS — M25532 Pain in left wrist: Secondary | ICD-10-CM | POA: Diagnosis not present

## 2018-12-15 DIAGNOSIS — Y999 Unspecified external cause status: Secondary | ICD-10-CM | POA: Insufficient documentation

## 2018-12-15 MED ORDER — TRAMADOL HCL 50 MG PO TABS
50.0000 mg | ORAL_TABLET | Freq: Once | ORAL | Status: AC
  Administered 2018-12-15: 50 mg via ORAL
  Filled 2018-12-15: qty 1

## 2018-12-15 MED ORDER — MELOXICAM 15 MG PO TABS: 15.0000 mg | ORAL_TABLET | Freq: Every day | ORAL | 0 refills | Status: DC
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
## 2018-12-15 NOTE — Discharge Instructions (Signed)
Please follow-up with your primary care provider if her symptoms are not improving over the week.  Take the meloxicam daily as prescribed.  Return to the emergency department for symptoms change or worsen if you are unable to schedule appointment with primary care.

## 2019-05-13 ENCOUNTER — Inpatient Hospital Stay: Payer: Medicaid Other | Attending: Oncology

## 2019-05-13 ENCOUNTER — Other Ambulatory Visit: Payer: Self-pay

## 2019-05-13 ENCOUNTER — Encounter: Payer: Self-pay | Admitting: Oncology

## 2019-05-13 ENCOUNTER — Emergency Department
Admission: EM | Admit: 2019-05-13 | Discharge: 2019-05-13 | Disposition: A | Discharge status: Home / Self Care | Payer: Medicaid Other | Attending: Emergency Medicine | Admitting: Emergency Medicine

## 2019-05-13 ENCOUNTER — Inpatient Hospital Stay (HOSPITAL_BASED_OUTPATIENT_CLINIC_OR_DEPARTMENT_OTHER): Payer: Medicaid Other | Admitting: Oncology

## 2019-05-13 VITALS — BP 123/84 | HR 77 | Temp 96.7°F | Resp 18 | Wt 178.7 lb

## 2019-05-13 DIAGNOSIS — Z7289 Other problems related to lifestyle: Secondary | ICD-10-CM | POA: Diagnosis not present

## 2019-05-13 DIAGNOSIS — Z720 Tobacco use: Secondary | ICD-10-CM | POA: Diagnosis not present

## 2019-05-13 DIAGNOSIS — Z903 Acquired absence of stomach [part of]: Secondary | ICD-10-CM | POA: Diagnosis not present

## 2019-05-13 DIAGNOSIS — Z789 Other specified health status: Secondary | ICD-10-CM

## 2019-05-13 DIAGNOSIS — Z85028 Personal history of other malignant neoplasm of stomach: Secondary | ICD-10-CM | POA: Insufficient documentation

## 2019-05-13 DIAGNOSIS — L03011 Cellulitis of right finger: Secondary | ICD-10-CM | POA: Insufficient documentation

## 2019-05-13 DIAGNOSIS — F17210 Nicotine dependence, cigarettes, uncomplicated: Secondary | ICD-10-CM | POA: Insufficient documentation

## 2019-05-13 DIAGNOSIS — E538 Deficiency of other specified B group vitamins: Secondary | ICD-10-CM

## 2019-05-13 DIAGNOSIS — C168 Malignant neoplasm of overlapping sites of stomach: Secondary | ICD-10-CM

## 2019-05-13 DIAGNOSIS — M79644 Pain in right finger(s): Secondary | ICD-10-CM | POA: Diagnosis present

## 2019-05-13 LAB — COMPREHENSIVE METABOLIC PANEL
ALT: 22 U/L (ref 0–44)
AST: 25 U/L (ref 15–41)
Albumin: 4.6 g/dL (ref 3.5–5.0)
Alkaline Phosphatase: 58 U/L (ref 38–126)
Anion gap: 9 (ref 5–15)
BUN: 24 mg/dL — ABNORMAL HIGH (ref 6–20)
CO2: 24 mmol/L (ref 22–32)
Calcium: 9.2 mg/dL (ref 8.9–10.3)
Chloride: 105 mmol/L (ref 98–111)
Creatinine, Ser: 1.39 mg/dL — ABNORMAL HIGH (ref 0.61–1.24)
GFR calc Af Amer: >60 mL/min (ref >60)
GFR calc non Af Amer: 58 mL/min — ABNORMAL LOW (ref >60)
Glucose, Bld: 145 mg/dL — ABNORMAL HIGH (ref 70–99)
Potassium: 4.1 mmol/L (ref 3.5–5.1)
Sodium: 138 mmol/L (ref 135–145)
Total Bilirubin: 0.9 mg/dL (ref 0.3–1.2)
Total Protein: 7.2 g/dL (ref 6.5–8.1)

## 2019-05-13 LAB — CBC WITH DIFFERENTIAL/PLATELET
Abs Immature Granulocytes: 0.01 10*3/uL (ref 0.00–0.07)
Basophils Absolute: 0.1 10*3/uL (ref 0.0–0.1)
Basophils Relative: 1 %
Eosinophils Absolute: 0.1 10*3/uL (ref 0.0–0.5)
Eosinophils Relative: 2 %
HCT: 46.1 % (ref 39.0–52.0)
Hemoglobin: 16.1 g/dL (ref 13.0–17.0)
Immature Granulocytes: 0 %
Lymphocytes Relative: 42 %
Lymphs Abs: 2.4 10*3/uL (ref 0.7–4.0)
MCH: 31.9 pg (ref 26.0–34.0)
MCHC: 34.9 g/dL (ref 30.0–36.0)
MCV: 91.5 fL (ref 80.0–100.0)
Monocytes Absolute: 0.6 10*3/uL (ref 0.1–1.0)
Monocytes Relative: 11 %
Neutro Abs: 2.5 10*3/uL (ref 1.7–7.7)
Neutrophils Relative %: 44 %
Platelets: 233 10*3/uL (ref 150–400)
RBC: 5.04 MIL/uL (ref 4.22–5.81)
RDW: 13.1 % (ref 11.5–15.5)
WBC: 5.7 10*3/uL (ref 4.0–10.5)
nRBC: 0 % (ref 0.0–0.2)

## 2019-05-13 LAB — FOLATE: Folate: 12.6 ng/mL (ref >5.9)

## 2019-05-13 LAB — FERRITIN: Ferritin: 56 ng/mL (ref 24–336)

## 2019-05-13 LAB — VITAMIN B12: Vitamin B-12: 336 pg/mL (ref 180–914)

## 2019-05-13 MED ORDER — LIDOCAINE-EPINEPHRINE-TETRACAINE (LET) TOPICAL GEL
3.0000 mL | Freq: Once | TOPICAL | Status: AC
  Administered 2019-05-13: 3 mL via TOPICAL
  Filled 2019-05-13: qty 3

## 2019-05-13 MED ORDER — IBUPROFEN 600 MG PO TABS
600.0000 mg | ORAL_TABLET | Freq: Once | ORAL | Status: AC
  Administered 2019-05-13: 600 mg via ORAL
  Filled 2019-05-13: qty 1

## 2019-05-13 MED ORDER — SULFAMETHOXAZOLE-TRIMETHOPRIM 800-160 MG PO TABS: 1.0000 | ORAL_TABLET | Freq: Two times a day (BID) | ORAL | 0 refills | Status: DC

## 2019-05-13 MED ORDER — OXYCODONE-ACETAMINOPHEN 7.5-325 MG PO TABS: 1.0000 | ORAL_TABLET | Freq: Four times a day (QID) | ORAL | 0 refills | Status: DC | PRN

## 2019-05-13 MED ORDER — IBUPROFEN 600 MG PO TABS: 600.0000 mg | ORAL_TABLET | Freq: Three times a day (TID) | ORAL | 0 refills | Status: DC | PRN

## 2019-05-13 MED ORDER — TRAMADOL HCL 50 MG PO TABS
50.0000 mg | ORAL_TABLET | Freq: Once | ORAL | Status: AC
  Administered 2019-05-13: 50 mg via ORAL
  Filled 2019-05-13: qty 1

## 2019-05-13 NOTE — Progress Notes (Signed)
Patient has right middle finger that is swollen and painful.  He states he is going to ER after this visit for evaluation of finger.

## 2019-05-13 NOTE — ED Triage Notes (Signed)
Pt comes via POV with c/o right middle finger that is swollen and red. Pt states he had bit his fingernail and later noticed the pain.  Pt states pain. No drainage noted

## 2019-05-13 NOTE — Progress Notes (Signed)
[facility] day:  05/13/19   Chief Complaint: Andrew Peters is a 54 y.o. male with stage IB gastric cancer who is seen for 6 month assessment.  Andrew Peters is a 54 y.o.[place] who has above oncology history reviewed by me today presented for follow up visit for management of Stage IB gastric carcinoma. Patient's previous oncology care was with Dr.Corcoran who switches practice to [place]. Patient starts to follow up with me on 02/16/2018.  Reviewed patient's previous oncology records.   He presented in 08/2011 with an 8 month history of epigastric pain, early satiety, anorexia, and hematemesis.  Upper endoscopy on 08/19/2011 revealed a large gastric antral ulcer.  Biopsies demonstrated chronic active gastritis with Helicobacter pylori as well as intramucosal adenocarcinoma.  PET scan did not reveal any disease outside of the stomach.    He underwent partial distal gastrectomy on 10/04/2011.  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  Margins were negative. Nine lymph nodes were negative.  Pathologic stage was T2N0M0.  CEA has ranged between 4 and 5.  He developed hematochezia and LUQ abdominal pain.   Abdomen and pelvic CT with contrast on 10/30/2014 revealed no evidence of bowel obstruction or inflammation.  There were multiple stable liver cysts and hemangiomas.  There was no evidence of residual tumor.  There was prostate enlargement with bladder wall thickening.  EGD on 11/13/2014 revealed a normal esophagus, gastric ulcer with a clean base and normal duodenum. Gastric biopsy revealed oxyntic mucosa with focal moderate chronic gastritis, negative for H pylori, dysplasia, and malignancy.  Colonoscopy on 11/13/2014 revealed a poor prep. There was a medium polyp in the descending colon. Pathology revealed a tubular adenoma with no high grade dysplasia or malignancy.   Repeat colonoscopy was recommended in 5 years. Abdomen and pelvic CT scan on 08/31/2015 revealed no evidence of metastatic disease. Ferritin has been followed:  55 on 08/20/2015, 51 on 12/10/2015, 40 on 06/23/2016, and 44 on 01/20/2017.  B12 was 414 on 06/23/2016 and 485 on 08/08/2017.  INTERVAL HISTORY 54 y.o. male with remote history of stage I gastric cancer presents for follow-up visit. Patient denies any abdominal pain, fever, chills, decreased appetite, unintentional weight loss. Today his main concern is his right middle finger is swollen and painful.    Past Medical History:  Diagnosis Date  . Cancer [facility]) 2012   Gastrectomy    Past Surgical History:  Procedure Laterality Date  . COLONOSCOPY WITH PROPOFOL N/A 11/13/2014   Procedure: COLONOSCOPY WITH PROPOFOL;  Surgeon: Hulen Luster, MD;  Location: [facility] ENDOSCOPY;  Service: Gastroenterology;  Laterality: N/A;  . ESOPHAGOGASTRODUODENOSCOPY (EGD) WITH PROPOFOL N/A 11/13/2014   Procedure: ESOPHAGOGASTRODUODENOSCOPY (EGD) WITH PROPOFOL;  Surgeon: Hulen Luster, MD;  Location: [facility] ENDOSCOPY;  Service: Gastroenterology;  Laterality: N/A;  . 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    History reviewed. No pertinent family history.  Social History   Socioeconomic History  . Marital status: Divorced    Spouse name: Not on file  . Number of children: Not on file  . Years of education: Not on file  . Highest education level: Not on file  Occupational History  . Not on file  Tobacco Use  . Smoking status: Current Some Day Smoker    Packs/day: 0.25    Types: Cigarettes  . Smokeless tobacco: Never Used  Substance and Sexual Activity  . Alcohol use: Yes    Alcohol/week: 42.0 standard drinks    Types: 42 Cans of beer per week  . Drug use: No  . Sexual activity: Not on file  Other Topics Concern  . Not on file  Social History Narrative  . Not on file   Social Determinants of Health   Financial  Resource Strain:   . Difficulty of Paying Living Expenses:   Food Insecurity:   . Worried About Charity fundraiser in the Last Year:   . Arboriculturist in the Last Year:   Transportation Needs:   . Film/video editor (Medical):   Andrew Kitchen Lack of Transportation (Non-Medical):   Physical Activity:   . Days of Exercise per Week:   . Minutes of Exercise per Session:   Stress:   . Feeling of Stress :   Social Connections:   . Frequency of Communication with Friends and Family:   . Frequency of Social Gatherings with Friends and Family:   . Attends Religious Services:   . Active Member of Clubs or Organizations:   . Attends Archivist Meetings:   Andrew Kitchen Marital Status:   Intimate Partner Violence:   . Fear of Current or Ex-Partner:   . Emotionally Abused:   Andrew Kitchen Physically Abused:   . Sexually Abused:    Allergies: No Known Allergies  Current Medications: No current facility-administered medications for this visit.   No current outpatient medications on file.   Facility-Administered Medications Ordered in Other Visits  Medication Dose Route Frequency Provider Last Rate Last Admin  . ibuprofen (ADVIL) tablet 600 mg  600 mg Oral Once Sable Feil, PA-C      . lidocaine-EPINEPHrine-tetracaine (LET) topical gel  3 mL Topical Once Sable Feil, PA-C      . traMADol Veatrice Bourbon) tablet 50 mg  50 mg Oral Once Sable Feil, PA-C        Review of Systems  Constitutional: Negative for chills, fever, malaise/fatigue and weight loss.  HENT: Negative for sore throat.   Eyes: Negative for redness.  Respiratory: Negative for cough, shortness of breath and wheezing.   Cardiovascular: Negative for chest pain, palpitations and leg swelling.  Gastrointestinal: Negative for abdominal pain, blood in stool, nausea and vomiting.  Genitourinary: Negative for dysuria.  Musculoskeletal: Negative for myalgias.  Skin: Negative for rash.  Neurological: Negative for dizziness, tingling and  tremors.  Endo/Heme/Allergies: Does not bruise/bleed easily.  Psychiatric/Behavioral: Negative for hallucinations.   Performance status (ECOG): 1 - Symptomatic but completely ambulatory  Physical Exam: Blood pressure 123/84, pulse 77, temperature (!) 96.7 F (35.9 C), resp. rate 18, weight 178 lb 11.2 oz (81.1 kg).  Physical Exam  Constitutional: He is oriented to person, place, and time. No distress.  HENT:  Head: Normocephalic and atraumatic.  Nose: Nose normal.  Mouth/Throat: Oropharynx is clear and moist. No oropharyngeal exudate.  Eyes: Pupils are equal, round, and reactive to light. EOM are normal. No scleral icterus.  Cardiovascular: Normal rate and regular rhythm.  No murmur heard. Pulmonary/Chest: Effort normal. No respiratory distress. He has no rales. He exhibits no tenderness.  Abdominal: Soft. He exhibits no distension. There is no abdominal tenderness.  Musculoskeletal:        General: No edema. Normal range of motion.     Cervical back: Normal range of motion and neck supple.  Neurological: He is alert and oriented to person, place, and time. No cranial nerve deficit.  He exhibits normal muscle tone. Coordination normal.  Skin: Skin is warm and dry. No erythema.  Middle finger swelling and tenderness around nail  Psychiatric: Affect normal.       Appointment on 05/13/2019  Component Date Value Ref Range Status  . Folate 05/13/2019 12.6  >5.9 ng/mL Final   Performed at [facility], [address] [place], [place] [address]  . Ferritin 05/13/2019 56  24 - 336 ng/mL Final   Performed at [facility], [address] [place], [place] [address]  . Sodium 05/13/2019 138  135 - 145 mmol/L Final  . Potassium 05/13/2019 4.1  3.5 - 5.1 mmol/L Final  . Chloride 05/13/2019 105  98 - 111 mmol/L Final  . CO2 05/13/2019 24  22 - 32 mmol/L Final  . Glucose, Bld 05/13/2019 145* 70 - 99 mg/dL Final   Glucose reference range applies only to samples taken  after fasting for at least 8 hours.  . BUN 05/13/2019 24* 6 - 20 mg/dL Final  . Creatinine, Ser 05/13/2019 1.39* 0.61 - 1.24 mg/dL Final  . Calcium 05/13/2019 9.2  8.9 - 10.3 mg/dL Final  . Total Protein 05/13/2019 7.2  6.5 - 8.1 g/dL Final  . Albumin 05/13/2019 4.6  3.5 - 5.0 g/dL Final  . AST 05/13/2019 25  15 - 41 U/L Final  . ALT 05/13/2019 22  0 - 44 U/L Final  . Alkaline Phosphatase 05/13/2019 58  38 - 126 U/L Final  . Total Bilirubin 05/13/2019 0.9  0.3 - 1.2 mg/dL Final  . GFR calc non Af Amer 05/13/2019 58* >60 mL/min Final  . GFR calc Af Amer 05/13/2019 >60  >60 mL/min Final  . Anion gap 05/13/2019 9  5 - 15 Final   Performed at [facility], [address] [place], [place] [address]  . WBC 05/13/2019 5.7  4.0 - 10.5 K/uL Final  . RBC 05/13/2019 5.04  4.22 - 5.81 MIL/uL Final  . Hemoglobin 05/13/2019 16.1  13.0 - 17.0 g/dL Final  . HCT 05/13/2019 46.1  39.0 - 52.0 % Final  . MCV 05/13/2019 91.5  80.0 - 100.0 fL Final  . MCH 05/13/2019 31.9  26.0 - 34.0 pg Final  . MCHC 05/13/2019 34.9  30.0 - 36.0 g/dL Final  . RDW 05/13/2019 13.1  11.5 - 15.5 % Final  . Platelets 05/13/2019 233  150 - 400 K/uL Final  . nRBC 05/13/2019 0.0  0.0 - 0.2 % Final  . Neutrophils Relative % 05/13/2019 44  % Final  . Neutro Abs 05/13/2019 2.5  1.7 - 7.7 K/uL Final  . Lymphocytes Relative 05/13/2019 42  % Final  . Lymphs Abs 05/13/2019 2.4  0.7 - 4.0 K/uL Final  . Monocytes Relative 05/13/2019 11  % Final  . Monocytes Absolute 05/13/2019 0.6  0.1 - 1.0 K/uL Final  . Eosinophils Relative 05/13/2019 2  % Final  . Eosinophils Absolute 05/13/2019 0.1  0.0 - 0.5 K/uL Final  . Basophils Relative 05/13/2019 1  % Final  . Basophils Absolute 05/13/2019 0.1  0.0 - 0.1 K/uL Final  . Immature Granulocytes 05/13/2019 0  % Final  . Abs Immature Granulocytes 05/13/2019 0.01  0.00 - 0.07 K/uL Final   Performed at [facility], [address] [place], [place] [address]   RADIOGRAPHIC STUDIES: I  have personally reviewed the radiological images as listed and agreed with the findings in the report. No results found. .  Assessment:  Andrew Peters is a  54 y.o. male old African American gentleman with stage IB gastric carcinoma.    1. Malignant neoplasm of overlapping sites of stomach ([place])   2. Paronychia of finger of right hand    # History of Stage IB gastric carcinoma.  Labs reviewed and discussed with patient. Patient is clinically doing well.  No constitutional symptoms. Clinical image as indicated.  Discussed with patient. Continue follow-up annually.  #Right middle finger swelling around nail, likely Paronychia Patient possibly has an abscess.  I recommend patient to go to emergency room for drainage and antibiotics.   Continue to smoke and drink alcohol.  Smoking alcohol cessation discussed with patient. History of vitamin B12 deficiency, continue vitamin B12 supplementation.  Check folate and vitamin B12. # RTC in 1 year Orders Placed This Encounter  Procedures  . CBC with Differential/Platelet    Standing Status:   Future    Standing Expiration Date:   05/12/2020  . Comprehensive metabolic panel    Standing Status:   Future    Standing Expiration Date:   05/12/2020    Earlie Server, MD, PhD Hematology Oncology [facility] Pager- [phone] 05/13/2019

## 2019-05-13 NOTE — Discharge Instructions (Addendum)
Follow discharge care instructions and take medication as directed.  Recommend 10-minute Epson salt soaks twice a day for 3 days.

## 2019-05-13 NOTE — ED Provider Notes (Signed)
[facility] Emergency Department Provider Note   ____________________________________________   First MD Initiated Contact with Patient 05/13/19 1129     (approximate)  I have reviewed the triage vital signs and the nursing notes.   HISTORY  Chief Complaint Hand Pain    HPI Andrew Peters is a 54 y.o. male patient complain of pain and edema to the right distal middle finger.  Patient state onset complaint after he bit his fingernail a few days ago.  Patient rates the pain 7/10.  Patient described pain as "achy".  No palliative measures for complaint.         Past Medical History:  Diagnosis Date  . Cancer [facility]) 2012   Gastrectomy    Patient Active Problem List   Diagnosis Date Noted  . Epigastric pain 08/20/2015  . Rectal bleeding 10/30/2014  . Gastric cancer ([place]) 10/04/2011    Past Surgical History:  Procedure Laterality Date  . COLONOSCOPY WITH PROPOFOL N/A 11/13/2014   Procedure: COLONOSCOPY WITH PROPOFOL;  Surgeon: Hulen Luster, MD;  Location: [facility] ENDOSCOPY;  Service: Gastroenterology;  Laterality: N/A;  . ESOPHAGOGASTRODUODENOSCOPY (EGD) WITH PROPOFOL N/A 11/13/2014   Procedure: ESOPHAGOGASTRODUODENOSCOPY (EGD) WITH PROPOFOL;  Surgeon: Hulen Luster, MD;  Location: [facility] ENDOSCOPY;  Service: Gastroenterology;  Laterality: N/A;  . 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    Prior to Admission medications   Not on File    Allergies Patient has no known allergies.  No family history on file.  Social History Social History   Tobacco Use  . Smoking status: Current Some Day Smoker    Packs/day: 0.25    Types: Cigarettes  . Smokeless tobacco: Never Used  Substance Use Topics  . Alcohol use: Yes    Alcohol/week: 42.0 standard drinks    Types: 42 Cans of beer per week  . Drug use: No    Review of Systems  Constitutional: No fever/chills Eyes: No visual changes. ENT: No sore throat.  Cardiovascular: Denies chest pain. Respiratory: Denies shortness of breath. Gastrointestinal: No abdominal pain.  No nausea, no vomiting.  No diarrhea.  No constipation. Genitourinary: Negative for dysuria. Musculoskeletal: Negative for back pain. Skin: Negative for rash.  Edema erythema nailbed third digit right hand. Neurological: Negative for headaches, focal weakness or numbness.   ____________________________________________   PHYSICAL EXAM:  VITAL SIGNS: ED Triage Vitals  Enc Vitals Group     BP 05/13/19 1124 139/82     Pulse Rate 05/13/19 1124 82     Resp 05/13/19 1124 18     Temp 05/13/19 1124 97.8 F (36.6 C)     Temp src --      SpO2 05/13/19 1124 100 %     Weight 05/13/19 1126 178 lb 11.2 oz (81.1 kg)     Height 05/13/19 1126 5\' 9"  (1.753 m)     Head Circumference --      Peak Flow --      Pain Score 05/13/19 1126 7     Pain Loc --      Pain Edu? --      Excl. in [place]? --    Constitutional: Alert and oriented. Well appearing and in no acute distress. Cardiovascular: Normal rate, regular rhythm. Grossly normal heart sounds.  Good peripheral circulation. Respiratory: Normal respiratory effort.  No retractions. Lungs CTAB. Musculoskeletal: No lower extremity tenderness nor edema.  No joint effusions. Neurologic:  Normal speech and language.  No gross focal neurologic deficits are appreciated. No gait instability. Skin:  Skin is warm, dry and intact. No rash noted.  Edema and erythema third digit right hand. Psychiatric: Mood and affect are normal. Speech and behavior are normal.  ____________________________________________   LABS (all labs ordered are listed, but only abnormal results are displayed)  Labs Reviewed - No data to display ____________________________________________  EKG   ____________________________________________  RADIOLOGY  ED MD interpretation:    Official radiology report(s): No results found.   ____________________________________________   PROCEDURES  Procedure(s) performed (including Critical Care):  Marland KitchenMarland KitchenIncision and Drainage  Date/Time: 05/13/2019 1:06 PM Performed by: Sable Feil, PA-C Authorized by: Sable Feil, PA-C   Consent:    Consent obtained:  Verbal   Consent given by:  Patient   Risks discussed:  Bleeding, incomplete drainage, pain and infection Location:    Type:  Abscess   Location:  Upper extremity   Upper extremity location:  Finger   Finger location:  R long finger Pre-procedure details:    Skin preparation:  Betadine Anesthesia (see MAR for exact dosages):    Anesthesia method:  Topical application   Topical anesthetic:  LET Procedure type:    Complexity:  Simple Procedure details:    Incision types:  Single straight   Incision depth:  Dermal   Scalpel blade:  11   Wound management:  Probed and deloculated   Drainage:  Purulent and bloody   Drainage amount:  Scant   Wound treatment:  Wound left open   Packing materials:  None Post-procedure details:    Patient tolerance of procedure:  Tolerated well, no immediate complications     ____________________________________________   INITIAL IMPRESSION / ASSESSMENT AND PLAN / ED COURSE  As part of my medical decision making, I reviewed the following data within the [place]     Patient presents with edema and erythema to the third digit nailbed of right hand.  Physical exam is consistent with paronychia.  See procedure note for incision and drainage.  Patient given discharge care instruction advised take medication as directed.  Patient advised follow-up PCP.    Andrew Peters was evaluated in Emergency Department on 05/13/2019 for the symptoms described in the history of present illness. He was evaluated in the context of the global COVID-19 pandemic, which necessitated consideration that the patient might be at risk for infection with the SARS-CoV-2 virus that  causes COVID-19. Institutional protocols and algorithms that pertain to the evaluation of patients at risk for COVID-19 are in a state of rapid change based on information released by regulatory bodies including the CDC and federal and state organizations. These policies and algorithms were followed during the patient's care in the ED.       ____________________________________________   FINAL CLINICAL IMPRESSION(S) / ED DIAGNOSES  Final diagnoses:  None     ED Discharge Orders    None       Note:  This document was prepared using Dragon voice recognition software and may include unintentional dictation errors.    Sable Feil, PA-C 05/13/19 1307    Earleen Newport, MD 05/13/19 [phone]

## 2019-05-13 NOTE — ED Notes (Signed)
Third digit on left hand nail inflamed and sore per pt. Walked to triage with no distress, oriented to room and call bell.

## 2019-09-21 ENCOUNTER — Other Ambulatory Visit: Payer: Self-pay

## 2019-09-21 ENCOUNTER — Encounter: Payer: Self-pay | Admitting: Emergency Medicine

## 2019-09-21 ENCOUNTER — Emergency Department
Admission: EM | Admit: 2019-09-21 | Discharge: 2019-09-21 | Disposition: A | Discharge status: Home / Self Care | Payer: Medicaid Other | Attending: Emergency Medicine | Admitting: Emergency Medicine

## 2019-09-21 DIAGNOSIS — Y999 Unspecified external cause status: Secondary | ICD-10-CM | POA: Insufficient documentation

## 2019-09-21 DIAGNOSIS — Z85020 Personal history of malignant carcinoid tumor of stomach: Secondary | ICD-10-CM | POA: Diagnosis not present

## 2019-09-21 DIAGNOSIS — Y939 Activity, unspecified: Secondary | ICD-10-CM | POA: Diagnosis not present

## 2019-09-21 DIAGNOSIS — S39012A Strain of muscle, fascia and tendon of lower back, initial encounter: Secondary | ICD-10-CM | POA: Diagnosis not present

## 2019-09-21 DIAGNOSIS — F17210 Nicotine dependence, cigarettes, uncomplicated: Secondary | ICD-10-CM | POA: Insufficient documentation

## 2019-09-21 DIAGNOSIS — Y929 Unspecified place or not applicable: Secondary | ICD-10-CM | POA: Diagnosis not present

## 2019-09-21 DIAGNOSIS — X500XXA Overexertion from strenuous movement or load, initial encounter: Secondary | ICD-10-CM | POA: Diagnosis not present

## 2019-09-21 MED ORDER — TRAMADOL HCL 50 MG PO TABS: 50.0000 mg | ORAL_TABLET | Freq: Four times a day (QID) | ORAL | 0 refills | Status: DC | PRN

## 2019-09-21 MED ORDER — METHOCARBAMOL 500 MG PO TABS: 500.0000 mg | ORAL_TABLET | Freq: Four times a day (QID) | ORAL | 0 refills | Status: DC

## 2019-09-21 MED ORDER — HYDROCODONE-ACETAMINOPHEN 5-325 MG PO TABS
1.0000 | ORAL_TABLET | Freq: Once | ORAL | Status: AC
  Administered 2019-09-21: 1 via ORAL
  Filled 2019-09-21: qty 1

## 2019-09-21 MED ORDER — MELOXICAM 7.5 MG PO TABS
15.0000 mg | ORAL_TABLET | Freq: Once | ORAL | Status: AC
  Administered 2019-09-21: 15 mg via ORAL
  Filled 2019-09-21: qty 2

## 2019-09-21 MED ORDER — METHOCARBAMOL 500 MG PO TABS
1000.0000 mg | ORAL_TABLET | Freq: Once | ORAL | Status: AC
  Administered 2019-09-21: 1000 mg via ORAL
  Filled 2019-09-21: qty 2

## 2019-09-21 MED ORDER — MELOXICAM 15 MG PO TABS: 15.0000 mg | ORAL_TABLET | Freq: Every day | ORAL | 0 refills | Status: DC

## 2019-09-21 NOTE — ED Triage Notes (Signed)
Pt to ED via POV c/o Lower back pain x 2 weeks. States that he was unable to go to work today due to the pain. Pt is in NAD.

## 2019-09-21 NOTE — ED Provider Notes (Signed)
[facility] Emergency Department Provider Note  ____________________________________________  Time seen: Approximately 6:36 PM  I have reviewed the triage vital signs and the nursing notes.   HISTORY  Chief Complaint Back Pain    HPI Andrew Peters is a 54 y.o. male who presents the emergency department complaining of left lower back pain with radiation down the left leg for 2 weeks.  Patient states that this occurred after lifting some heavy boxes for her friend.  Patient had mild symptoms in the back to begin with, symptoms have progressed.  No bowel or bladder function, saddle anesthesia or paresthesias.  No history of previous back injury.  Patient states that the pain feels mostly like tightness.  No urinary or GI complaints.  Over-the-counter medications not alleviating symptoms at this time.         Past Medical History:  Diagnosis Date  . Cancer [facility]) 2012   Gastrectomy    Patient Active Problem List   Diagnosis Date Noted  . Epigastric pain 08/20/2015  . Rectal bleeding 10/30/2014  . Gastric cancer ([place]) 10/04/2011    Past Surgical History:  Procedure Laterality Date  . COLONOSCOPY WITH PROPOFOL N/A 11/13/2014   Procedure: COLONOSCOPY WITH PROPOFOL;  Surgeon: Hulen Luster, MD;  Location: [facility] ENDOSCOPY;  Service: Gastroenterology;  Laterality: N/A;  . ESOPHAGOGASTRODUODENOSCOPY (EGD) WITH PROPOFOL N/A 11/13/2014   Procedure: ESOPHAGOGASTRODUODENOSCOPY (EGD) WITH PROPOFOL;  Surgeon: Hulen Luster, MD;  Location[facility] ENDOSCOPY;  Service: Gastroenterology;  Laterality: N/A;  . 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    Prior to Admission medications   Medication Sig Start Date End Date Taking? Authorizing Provider  ibuprofen (ADVIL) 600 MG tablet Take 1 tablet (600 mg total) by mouth every 8 (eight) hours as needed. 05/13/19   Sable Feil, PA-C  meloxicam (MOBIC) 15 MG tablet Take 1 tablet (15 mg total) by  mouth daily. 09/21/19   Clessie Karras, Charline Bills, PA-C  methocarbamol (ROBAXIN) 500 MG tablet Take 1 tablet (500 mg total) by mouth 4 (four) times daily. 09/21/19   Johnathon Olden, Charline Bills, PA-C  oxyCODONE-acetaminophen (PERCOCET) 7.5-325 MG tablet Take 1 tablet by mouth every 6 (six) hours as needed. 05/13/19   Sable Feil, PA-C  sulfamethoxazole-trimethoprim (BACTRIM DS) 800-160 MG tablet Take 1 tablet by mouth 2 (two) times daily. 05/13/19   Sable Feil, PA-C  traMADol (ULTRAM) 50 MG tablet Take 1 tablet (50 mg total) by mouth every 6 (six) hours as needed. 09/21/19   Derian Dimalanta, Charline Bills, PA-C    Allergies Patient has no known allergies.  No family history on file.  Social History Social History   Tobacco Use  . Smoking status: Current Some Day Smoker    Packs/day: 0.25    Types: Cigarettes  . Smokeless tobacco: Never Used  Vaping Use  . Vaping Use: Never used  Substance Use Topics  . Alcohol use: Yes    Alcohol/week: 42.0 standard drinks    Types: 42 Cans of beer per week  . Drug use: No     Review of Systems  Constitutional: No fever/chills Eyes: No visual changes. No discharge ENT: No upper respiratory complaints. Cardiovascular: no chest pain. Respiratory: no cough. No SOB. Gastrointestinal: No abdominal pain.  No nausea, no vomiting.  No diarrhea.  No constipation. Genitourinary: Negative for dysuria. No hematuria Musculoskeletal: Left lower back pain Skin: Negative for rash, abrasions, lacerations, ecchymosis. Neurological: Negative for headaches, focal  weakness or numbness. 10-point ROS otherwise negative.  ____________________________________________   PHYSICAL EXAM:  VITAL SIGNS: ED Triage Vitals [09/21/19 1510]  Enc Vitals Group     BP (!) 145/88     Pulse Rate 92     Resp 16     Temp 98.2 F (36.8 C)     Temp Source Oral     SpO2 97 %     Weight 170 lb (77.1 kg)     Height 5\' 7"  (1.702 m)     Head Circumference      Peak Flow      Pain Score  10     Pain Loc      Pain Edu?      Excl. in [place]?      Constitutional: Alert and oriented. Well appearing and in no acute distress. Eyes: Conjunctivae are normal. PERRL. EOMI. Head: Atraumatic. ENT:      Ears:       Nose: No congestion/rhinnorhea.      Mouth/Throat: Mucous membranes are moist.  Neck: No stridor.  No cervical spine tenderness to palpation.  Cardiovascular: Normal rate, regular rhythm. Normal S1 and S2.  Good peripheral circulation. Respiratory: Normal respiratory effort without tachypnea or retractions. Lungs CTAB. Good air entry to the bases with no decreased or absent breath sounds. Gastrointestinal: Bowel sounds 4 quadrants. Soft and nontender to palpation. No guarding or rigidity. No palpable masses. No distention. No CVA tenderness. Musculoskeletal: Full range of motion to all extremities. No gross deformities appreciated.  Visualization of the left lower back feels no visible signs of trauma.  Patient has nontender midline right paraspinal muscle group, only on the left.  This does not extend into the sciatic notch.  Negative straight leg raise bilaterally.  Dorsalis pedis pulse and sensation intact distally. Neurologic:  Normal speech and language. No gross focal neurologic deficits are appreciated.  Skin:  Skin is warm, dry and intact. No rash noted. Psychiatric: Mood and affect are normal. Speech and behavior are normal. Patient exhibits appropriate insight and judgement.   ____________________________________________   LABS (all labs ordered are listed, but only abnormal results are displayed)  Labs Reviewed - No data to display ____________________________________________  EKG   ____________________________________________  RADIOLOGY   No results found.  ____________________________________________    PROCEDURES  Procedure(s) performed:    Procedures    Medications  meloxicam (MOBIC) tablet 15 mg (has no administration in time range)   methocarbamol (ROBAXIN) tablet 1,000 mg (has no administration in time range)  HYDROcodone-acetaminophen (NORCO/VICODIN) 5-325 MG per tablet 1 tablet (has no administration in time range)     ____________________________________________   INITIAL IMPRESSION / ASSESSMENT AND PLAN / ED COURSE  Pertinent labs & imaging results that were available during my care of the patient were reviewed by me and considered in my medical decision making (see chart for details).  Review of the [place] CSRS was performed in accordance of the [place] prior to dispensing any controlled drugs.           Patient's diagnosis is consistent with lumbar strain.  Patient presented to emergency department complaining of left-sided back pain after helping a friend lift some heavy boxes.  No concerning neuro deficits at this time.  Exam was consistent with lumbar strain.  Patient was placed on anti-inflammatory and muscle relaxer as well as Ultram.  Follow-up primary care as needed.  No indication for labs or imaging at this time.   Patient is given ED precautions to  return to the ED for any worsening or new symptoms.     ____________________________________________  FINAL CLINICAL IMPRESSION(S) / ED DIAGNOSES  Final diagnoses:  Strain of lumbar region, initial encounter      NEW MEDICATIONS STARTED DURING THIS VISIT:  ED Discharge Orders         Ordered    meloxicam (MOBIC) 15 MG tablet  Daily     Discontinue  Reprint     09/21/19 1847    methocarbamol (ROBAXIN) 500 MG tablet  4 times daily     Discontinue  Reprint     09/21/19 1847    traMADol (ULTRAM) 50 MG tablet  Every 6 hours PRN     Discontinue  Reprint     09/21/19 1847              This chart was dictated using voice recognition software/Dragon. Despite best efforts to proofread, errors can occur which can change the meaning. Any change was purely unintentional.    Darletta Moll, PA-C 09/21/19 1910    Nena Polio,  MD 09/22/19 [phone]

## 2020-03-21 DIAGNOSIS — H5213 Myopia, bilateral: Secondary | ICD-10-CM | POA: Diagnosis not present

## 2020-05-12 ENCOUNTER — Inpatient Hospital Stay: Payer: Medicaid Other | Attending: Oncology

## 2020-05-12 ENCOUNTER — Encounter: Payer: Self-pay | Admitting: Oncology

## 2020-05-12 ENCOUNTER — Inpatient Hospital Stay (HOSPITAL_BASED_OUTPATIENT_CLINIC_OR_DEPARTMENT_OTHER): Payer: Medicaid Other | Admitting: Oncology

## 2020-05-12 VITALS — BP 151/101 | HR 101 | Temp 96.7°F | Resp 18 | Wt 187.6 lb

## 2020-05-12 DIAGNOSIS — L03011 Cellulitis of right finger: Secondary | ICD-10-CM

## 2020-05-12 DIAGNOSIS — Z85028 Personal history of other malignant neoplasm of stomach: Secondary | ICD-10-CM | POA: Diagnosis present

## 2020-05-12 DIAGNOSIS — Z789 Other specified health status: Secondary | ICD-10-CM

## 2020-05-12 DIAGNOSIS — E538 Deficiency of other specified B group vitamins: Secondary | ICD-10-CM

## 2020-05-12 DIAGNOSIS — C168 Malignant neoplasm of overlapping sites of stomach: Secondary | ICD-10-CM

## 2020-05-12 DIAGNOSIS — R197 Diarrhea, unspecified: Secondary | ICD-10-CM | POA: Diagnosis not present

## 2020-05-12 DIAGNOSIS — F17210 Nicotine dependence, cigarettes, uncomplicated: Secondary | ICD-10-CM | POA: Insufficient documentation

## 2020-05-12 DIAGNOSIS — Z7289 Other problems related to lifestyle: Secondary | ICD-10-CM | POA: Insufficient documentation

## 2020-05-12 DIAGNOSIS — Z720 Tobacco use: Secondary | ICD-10-CM

## 2020-05-12 DIAGNOSIS — R339 Retention of urine, unspecified: Secondary | ICD-10-CM | POA: Insufficient documentation

## 2020-05-12 DIAGNOSIS — Z9049 Acquired absence of other specified parts of digestive tract: Secondary | ICD-10-CM | POA: Insufficient documentation

## 2020-05-12 LAB — CBC WITH DIFFERENTIAL/PLATELET
Abs Immature Granulocytes: 0.02 10*3/uL (ref 0.00–0.07)
Basophils Absolute: 0 10*3/uL (ref 0.0–0.1)
Basophils Relative: 1 %
Eosinophils Absolute: 0.3 10*3/uL (ref 0.0–0.5)
Eosinophils Relative: 5 %
HCT: 44.4 % (ref 39.0–52.0)
Hemoglobin: 15.7 g/dL (ref 13.0–17.0)
Immature Granulocytes: 0 %
Lymphocytes Relative: 40 %
Lymphs Abs: 2 10*3/uL (ref 0.7–4.0)
MCH: 32.6 pg (ref 26.0–34.0)
MCHC: 35.4 g/dL (ref 30.0–36.0)
MCV: 92.1 fL (ref 80.0–100.0)
Monocytes Absolute: 0.6 10*3/uL (ref 0.1–1.0)
Monocytes Relative: 12 %
Neutro Abs: 2.1 10*3/uL (ref 1.7–7.7)
Neutrophils Relative %: 42 %
Platelets: 197 10*3/uL (ref 150–400)
RBC: 4.82 MIL/uL (ref 4.22–5.81)
RDW: 12.6 % (ref 11.5–15.5)
WBC: 4.9 10*3/uL (ref 4.0–10.5)
nRBC: 0 % (ref 0.0–0.2)

## 2020-05-12 LAB — COMPREHENSIVE METABOLIC PANEL
ALT: 25 U/L (ref 0–44)
AST: 46 U/L — ABNORMAL HIGH (ref 15–41)
Albumin: 4.6 g/dL (ref 3.5–5.0)
Alkaline Phosphatase: 58 U/L (ref 38–126)
Anion gap: 10 (ref 5–15)
BUN: 15 mg/dL (ref 6–20)
CO2: 24 mmol/L (ref 22–32)
Calcium: 9.2 mg/dL (ref 8.9–10.3)
Chloride: 105 mmol/L (ref 98–111)
Creatinine, Ser: 1.22 mg/dL (ref 0.61–1.24)
GFR, Estimated: >60 mL/min (ref >60)
Glucose, Bld: 117 mg/dL — ABNORMAL HIGH (ref 70–99)
Potassium: 3.8 mmol/L (ref 3.5–5.1)
Sodium: 139 mmol/L (ref 135–145)
Total Bilirubin: 1.5 mg/dL — ABNORMAL HIGH (ref 0.3–1.2)
Total Protein: 7 g/dL (ref 6.5–8.1)

## 2020-05-12 LAB — IRON AND TIBC
Iron: 178 ug/dL (ref 45–182)
Saturation Ratios: 44 % — ABNORMAL HIGH (ref 17.9–39.5)
TIBC: 402 ug/dL (ref 250–450)
UIBC: 224 ug/dL

## 2020-05-12 LAB — FERRITIN: Ferritin: 67 ng/mL (ref 24–336)

## 2020-05-12 LAB — FOLATE: Folate: 10.8 ng/mL (ref >5.9)

## 2020-05-12 LAB — VITAMIN B12: Vitamin B-12: 305 pg/mL (ref 180–914)

## 2020-05-12 NOTE — Progress Notes (Signed)
[facility] day:  05/12/20   Chief Complaint: Andrew Peters is a 55 y.o. male with stage IB gastric cancer who is seen for 6 month assessment.  Andrew Peters is a 55 y.o.[place] who has above oncology history reviewed by me today presented for follow up visit for management of Stage IB gastric carcinoma. Patient's previous oncology care was with Dr.Corcoran who switches practice to [place]. Patient starts to follow up with me on 02/16/2018.  Reviewed patient's previous oncology records.   He presented in 08/2011 with an 8 month history of epigastric pain, early satiety, anorexia, and hematemesis.  Upper endoscopy on 08/19/2011 revealed a large gastric antral ulcer.  Biopsies demonstrated chronic active gastritis with Helicobacter pylori as well as intramucosal adenocarcinoma.  PET scan did not reveal any disease outside of the stomach.    He underwent partial distal gastrectomy on 10/04/2011.  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  Margins were negative. Nine lymph nodes were negative.  Pathologic stage was T2N0M0.  CEA has ranged between 4 and 5.  He developed hematochezia and LUQ abdominal pain.   Abdomen and pelvic CT with contrast on 10/30/2014 revealed no evidence of bowel obstruction or inflammation.  There were multiple stable liver cysts and hemangiomas.  There was no evidence of residual tumor.  There was prostate enlargement with bladder wall thickening.  EGD on 11/13/2014 revealed a normal esophagus, gastric ulcer with a clean base and normal duodenum. Gastric biopsy revealed oxyntic mucosa with focal moderate chronic gastritis, negative for H pylori, dysplasia, and malignancy.  Colonoscopy on 11/13/2014 revealed a poor prep. There was a medium polyp in the descending colon. Pathology revealed a tubular adenoma with no high grade dysplasia or malignancy.   Repeat colonoscopy was recommended in 5 years. Abdomen and pelvic CT scan on 08/31/2015 revealed no evidence of metastatic disease. Ferritin has been followed:  55 on 08/20/2015, 51 on 12/10/2015, 40 on 06/23/2016, and 44 on 01/20/2017.  B12 was 414 on 06/23/2016 and 485 on 08/08/2017.  INTERVAL HISTORY 55 y.o. male with remote history of stage I gastric cancer presents for follow-up visit. Patient denies any abdominal pain, unintentional weight loss, fever, chills, nausea vomiting epigastric discomfort.  He continues to smoke and drink alcohol. He reports having difficulty starting stream for urination.  Also reports loose stools 3-4 times for the past months.   Past Medical History:  Diagnosis Date  . [facility]) 2012   Gastrectomy    Past Surgical History:  Procedure Laterality Date  . COLONOSCOPY WITH PROPOFOL N/A 11/13/2014   Procedure: COLONOSCOPY WITH PROPOFOL;  Surgeon: Hulen Luster, MD;  Location: [facility] ENDOSCOPY;  Service: Gastroenterology;  Laterality: N/A;  . ESOPHAGOGASTRODUODENOSCOPY (EGD) WITH PROPOFOL N/A 11/13/2014   Procedure: ESOPHAGOGASTRODUODENOSCOPY (EGD) WITH PROPOFOL;  Surgeon: Hulen Luster, MD;  Location: [facility] ENDOSCOPY;  Service: Gastroenterology;  Laterality: N/A;  . 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    History reviewed. No pertinent family history.  Social History   Socioeconomic History  . Marital status: Divorced    Spouse name: Not on file  . Number of children: Not on file  . Years of education: Not on file  . Highest education level: Not on file  Occupational History  . Not on file  Tobacco Use  . Smoking status: Current Some Day Smoker  Packs/day: 0.25    Types: Cigarettes  . Smokeless tobacco: Never Used  Vaping Use  . Vaping Use: Never used  Substance and Sexual Activity  . Alcohol use: Yes    Alcohol/week: 42.0 standard drinks    Types: 42 Cans of beer per week  . Drug use: No  . Sexual  activity: Not on file  Other Topics Concern  . Not on file  Social History Narrative  . Not on file   Social Determinants of Health   Financial Resource Strain: Not on file  Food Insecurity: Not on file  Transportation Needs: Not on file  Physical Activity: Not on file  Stress: Not on file  Social Connections: Not on file  Intimate Partner Violence: Not on file   Allergies: No Known Allergies  Current Medications: Current Outpatient Medications  Medication Sig Dispense Refill  . ibuprofen (ADVIL) 600 MG tablet Take 1 tablet (600 mg total) by mouth every 8 (eight) hours as needed. (Patient not taking: Reported on 05/12/2020) 15 tablet 0   No current facility-administered medications for this visit.    Review of Systems  Constitutional: Negative for chills, fever, malaise/fatigue and weight loss.  HENT: Negative for sore throat.   Eyes: Negative for redness.  Respiratory: Negative for cough, shortness of breath and wheezing.   Cardiovascular: Negative for chest pain, palpitations and leg swelling.  Gastrointestinal: Positive for diarrhea. Negative for abdominal pain, blood in stool, nausea and vomiting.  Genitourinary: Negative for dysuria.  Musculoskeletal: Negative for myalgias.  Skin: Negative for rash.  Neurological: Negative for dizziness, tingling and tremors.  Endo/Heme/Allergies: Does not bruise/bleed easily.  Psychiatric/Behavioral: Negative for hallucinations.   Performance status (ECOG): 1 - Symptomatic but completely ambulatory  Physical Exam: Blood pressure (!) 151/101, pulse (!) 101, temperature (!) 96.7 F (35.9 C), temperature source Tympanic, resp. rate 18, weight 187 lb 9.6 oz (85.1 kg), SpO2 100 %.  Physical Exam Constitutional:      General: He is not in acute distress. HENT:     Head: Normocephalic and atraumatic.     Nose: Nose normal.     Mouth/Throat:     Pharynx: No oropharyngeal exudate.  Eyes:     General: No scleral icterus.    Pupils:  Pupils are equal, round, and reactive to light.  Cardiovascular:     Rate and Rhythm: Normal rate and regular rhythm.     Heart sounds: No murmur heard.   Pulmonary:     Effort: Pulmonary effort is normal. No respiratory distress.     Breath sounds: No rales.  Chest:     Chest wall: No tenderness.  Abdominal:     General: There is no distension.     Palpations: Abdomen is soft.     Tenderness: There is no abdominal tenderness.  Musculoskeletal:        General: Normal range of motion.     Cervical back: Normal range of motion and neck supple.  Skin:    General: Skin is warm and dry.     Findings: No erythema.  Neurological:     Mental Status: He is alert and oriented to person, place, and time.     Cranial Nerves: No cranial nerve deficit.     Motor: No abnormal muscle tone.     Coordination: Coordination normal.  Psychiatric:        Mood and Affect: Affect normal.        Appointment on 05/12/2020  Component Date Value Ref Range Status  .  Iron 05/12/2020 178  45 - 182 ug/dL Final  . TIBC 05/12/2020 402  250 - 450 ug/dL Final  . Saturation Ratios 05/12/2020 44* 17.9 - 39.5 % Final  . UIBC 05/12/2020 224  ug/dL Final   Performed at [facility], [address], [place], [place] [address]  . Ferritin 05/12/2020 67  24 - 336 ng/mL Final   Performed at [facility], [address] [place], [place] [address]  . Folate 05/12/2020 10.8  >5.9 ng/mL Final   Performed at [facility], [address] [place], [place] [address]  . Sodium 05/12/2020 139  135 - 145 mmol/L Final  . Potassium 05/12/2020 3.8  3.5 - 5.1 mmol/L Final  . Chloride 05/12/2020 105  98 - 111 mmol/L Final  . CO2 05/12/2020 24  22 - 32 mmol/L Final  . Glucose, Bld 05/12/2020 117* 70 - 99 mg/dL Final   Glucose reference range applies only to samples taken after fasting for at least 8 hours.  . BUN 05/12/2020 15  6 - 20 mg/dL Final  . Creatinine, Ser 05/12/2020 1.22  0.61 - 1.24  mg/dL Final  . Calcium 05/12/2020 9.2  8.9 - 10.3 mg/dL Final  . Total Protein 05/12/2020 7.0  6.5 - 8.1 g/dL Final  . Albumin 05/12/2020 4.6  3.5 - 5.0 g/dL Final  . AST 05/12/2020 46* 15 - 41 U/L Final  . ALT 05/12/2020 25  0 - 44 U/L Final  . Alkaline Phosphatase 05/12/2020 58  38 - 126 U/L Final  . Total Bilirubin 05/12/2020 1.5* 0.3 - 1.2 mg/dL Final  . GFR, Estimated 05/12/2020 >60  >60 mL/min Final   Comment: (NOTE) Calculated using the CKD-EPI Creatinine Equation (2021)   . Anion gap 05/12/2020 10  5 - 15 Final   Performed at [facility], [address] [place], [place] [address]  . WBC 05/12/2020 4.9  4.0 - 10.5 K/uL Final  . RBC 05/12/2020 4.82  4.22 - 5.81 MIL/uL Final  . Hemoglobin 05/12/2020 15.7  13.0 - 17.0 g/dL Final  . HCT 05/12/2020 44.4  39.0 - 52.0 % Final  . MCV 05/12/2020 92.1  80.0 - 100.0 fL Final  . MCH 05/12/2020 32.6  26.0 - 34.0 pg Final  . MCHC 05/12/2020 35.4  30.0 - 36.0 g/dL Final  . RDW 05/12/2020 12.6  11.5 - 15.5 % Final  . Platelets 05/12/2020 197  150 - 400 K/uL Final  . nRBC 05/12/2020 0.0  0.0 - 0.2 % Final  . Neutrophils Relative % 05/12/2020 42  % Final  . Neutro Abs 05/12/2020 2.1  1.7 - 7.7 K/uL Final  . Lymphocytes Relative 05/12/2020 40  % Final  . Lymphs Abs 05/12/2020 2.0  0.7 - 4.0 K/uL Final  . Monocytes Relative 05/12/2020 12  % Final  . Monocytes Absolute 05/12/2020 0.6  0.1 - 1.0 K/uL Final  . Eosinophils Relative 05/12/2020 5  % Final  . Eosinophils Absolute 05/12/2020 0.3  0.0 - 0.5 K/uL Final  . Basophils Relative 05/12/2020 1  % Final  . Basophils Absolute 05/12/2020 0.0  0.0 - 0.1 K/uL Final  . Immature Granulocytes 05/12/2020 0  % Final  . Abs Immature Granulocytes 05/12/2020 0.02  0.00 - 0.07 K/uL Final   Performed at [facility], [address] [place],  [address]   RADIOGRAPHIC STUDIES: I have personally reviewed the radiological images as listed and agreed with the findings in the report. No  results found. Marland Kitchen  Assessment:  Andrew Peters is a 55 y.o. male old African American gentleman with stage IB gastric carcinoma.    1. Malignant neoplasm of overlapping sites of stomach ([place])   2. Tobacco abuse   3. Vitamin B12 deficiency   4. Urinary retention    # History of Stage IB gastric carcinoma.  Labs are reviewed and discussed with the patient. He is doing well from oncology aspect.  No constitutional symptoms. Clinical images as indicated. Continue follow-up annually.  #Difficulty urination.  Refer to urology for evaluation.  Possible BPH. #Diarrhea, check C. difficile. Continue to smoke and drink alcohol.  Smoking alcohol cessation discussed with patient. History of vitamin B12 deficiency, continue vitamin B12 supplementation.  Check folate and vitamin B12. He does not have primary care provision and I recommend him to establish with a PCP.  A list of PCPs permission was provided to him. # RTC in 1 year Orders Placed This Encounter  Procedures  . Clostridium Difficile by PCR    Standing Status:   Future    Standing Expiration Date:   05/12/2021  . Ferritin    Standing Status:   Future    Standing Expiration Date:   05/12/2021  . Folate    Standing Status:   Future    Standing Expiration Date:   05/12/2021  . Vitamin B12    Standing Status:   Future    Standing Expiration Date:   05/12/2021  . CBC with Differential/Platelet    Standing Status:   Future    Standing Expiration Date:   05/12/2021  . Comprehensive metabolic panel    Standing Status:   Future    Standing Expiration Date:   05/12/2021  . Iron and TIBC    Standing Status:   Future    Standing Expiration Date:   05/12/2021  . Ambulatory referral to Urology    Referral Priority:   Routine    Referral Type:   Consultation    Referral Reason:   Specialty Services Required    Referred to Provider:   Billey Co, MD    Requested Specialty:   Urology    Number of Visits Requested:   1    Earlie Server, MD,  PhD Hematology Oncology [facility] Pager- [phone] 05/12/2020

## 2020-05-12 NOTE — Progress Notes (Signed)
Pt in for yearly follow up, reports been having difficulty starting stream to urinate for past month as well loose stools 3-4 times a day for a month.  Denies use any antidiarrheal medication. Pt had a loose stool in clinic this morning.

## 2020-05-13 ENCOUNTER — Ambulatory Visit (INDEPENDENT_AMBULATORY_CARE_PROVIDER_SITE_OTHER): Payer: Medicaid Other | Admitting: Urology

## 2020-05-13 ENCOUNTER — Other Ambulatory Visit: Payer: Self-pay

## 2020-05-13 ENCOUNTER — Encounter: Payer: Self-pay | Admitting: Urology

## 2020-05-13 ENCOUNTER — Other Ambulatory Visit: Payer: Self-pay | Admitting: Oncology

## 2020-05-13 VITALS — BP 139/94 | HR 99 | Ht 70.0 in | Wt 187.0 lb

## 2020-05-13 DIAGNOSIS — C168 Malignant neoplasm of overlapping sites of stomach: Secondary | ICD-10-CM

## 2020-05-13 DIAGNOSIS — R3911 Hesitancy of micturition: Secondary | ICD-10-CM | POA: Diagnosis not present

## 2020-05-13 DIAGNOSIS — R3916 Straining to void: Secondary | ICD-10-CM

## 2020-05-13 DIAGNOSIS — E538 Deficiency of other specified B group vitamins: Secondary | ICD-10-CM

## 2020-05-13 DIAGNOSIS — Z85028 Personal history of other malignant neoplasm of stomach: Secondary | ICD-10-CM | POA: Diagnosis not present

## 2020-05-13 DIAGNOSIS — R197 Diarrhea, unspecified: Secondary | ICD-10-CM

## 2020-05-13 DIAGNOSIS — N401 Enlarged prostate with lower urinary tract symptoms: Secondary | ICD-10-CM | POA: Diagnosis not present

## 2020-05-13 DIAGNOSIS — R339 Retention of urine, unspecified: Secondary | ICD-10-CM

## 2020-05-13 LAB — BLADDER SCAN AMB NON-IMAGING: Scan Result: 37

## 2020-05-13 LAB — C DIFFICILE QUICK SCREEN W PCR REFLEX
C Diff antigen: NEGATIVE
C Diff interpretation: NOT DETECTED
C Diff toxin: NEGATIVE

## 2020-05-13 MED ORDER — TAMSULOSIN HCL 0.4 MG PO CAPS
0.4000 mg | ORAL_CAPSULE | Freq: Every day | ORAL | 0 refills | Status: DC
Start: 2020-05-13 — End: 2020-06-22

## 2020-05-13 NOTE — Progress Notes (Signed)
c di

## 2020-05-13 NOTE — Progress Notes (Unsigned)
c 

## 2020-05-14 LAB — URINALYSIS, COMPLETE
Bilirubin, UA: NEGATIVE
Glucose, UA: NEGATIVE
Ketones, UA: NEGATIVE
Leukocytes,UA: NEGATIVE
Nitrite, UA: NEGATIVE
RBC, UA: NEGATIVE
Specific Gravity, UA: 1.025 (ref 1.005–1.030)
Urobilinogen, Ur: 0.2 mg/dL (ref 0.2–1.0)
pH, UA: 6.5 (ref 5.0–7.5)

## 2020-05-14 LAB — MICROSCOPIC EXAMINATION
Bacteria, UA: NONE SEEN
RBC, Urine: NONE SEEN /hpf (ref 0–2)

## 2020-05-14 NOTE — Progress Notes (Signed)
   05/13/2020 7:47 AM   Andrew Peters    1965/10/09 [id number]  Referring provider: Earlie Server, MD [place],  [place] [address]  Chief Complaint  Patient presents with  . Urinary Retention    HPI: Andrew Peters is a 55 y.o. male referred for evaluation of voiding difficulty.   ~ 1 month history of lower urinary tract symptoms primarily urinary hesitancy and straining to void  Denies dysuria, gross hematuria  Denies flank, abdominal or pelvic pain  No prior history urologic problems   PMH: Past Medical History:  Diagnosis Date  . Cancer [facility]) 2012   Gastrectomy    Surgical History: Past Surgical History:  Procedure Laterality Date  . COLONOSCOPY WITH PROPOFOL N/A 11/13/2014   Procedure: COLONOSCOPY WITH PROPOFOL;  Surgeon: Hulen Luster, MD;  Location: [facility] ENDOSCOPY;  Service: Gastroenterology;  Laterality: N/A;  . ESOPHAGOGASTRODUODENOSCOPY (EGD) WITH PROPOFOL N/A 11/13/2014   Procedure: ESOPHAGOGASTRODUODENOSCOPY (EGD) WITH PROPOFOL;  Surgeon: Hulen Luster, MD;  Location: [facility] ENDOSCOPY;  Service: Gastroenterology;  Laterality: N/A;  . 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    Home Medications:  Allergies as of 05/13/2020   No Known Allergies     Medication List       Accurate as of May 13, 2020 11:59 PM. If you have any questions, ask your nurse or doctor.        ibuprofen 600 MG tablet Commonly known as: ADVIL Take 1 tablet (600 mg total) by mouth every 8 (eight) hours as needed.   tamsulosin 0.4 MG Caps capsule Commonly known as: FLOMAX Take 1 capsule (0.4 mg total) by mouth daily. Started by: Abbie Sons, MD       Allergies: No Known Allergies  Family History: No family history on file.  Social History:  reports that he has been smoking cigarettes. He has been smoking about 0.25 packs per day. He has never used smokeless tobacco. He reports current alcohol use of about 42.0 standard  drinks of alcohol per week. He reports that he does not use drugs.   Physical Exam: BP (!) 139/94   Pulse 99   Ht 5\' 10"  (1.778 m)   Wt 187 lb (84.8 kg)   BMI 26.83 kg/m   Constitutional:  Alert and oriented, No acute distress. HEENT: [place] AT, moist mucus membranes.  Trachea midline, no masses. Cardiovascular: No clubbing, cyanosis, or edema. Respiratory: Normal respiratory effort, no increased work of breathing. GU: Prostate 40 g, smooth without nodules Skin: No rashes, bruises or suspicious lesions. Neurologic: Grossly intact, no focal deficits, moving all 4 extremities. Psychiatric: Normal mood and affect.   Assessment & Plan:    1.  BPH with LUTS  Obstructive voiding symptoms, hesitancy and straining  CT 2016 did show prostate enlargement  Trial tamsulosin 0.4 mg daily  Bladder scan PVR today not significant at 37 mL  Follow-up 1 month symptom reassessment  Cystoscopy for persistent voiding symptoms  PSA on follow-up   Abbie Sons, MD  [facility] [address], [place], [place] [address] [phone]

## 2020-05-17 ENCOUNTER — Encounter: Payer: Self-pay | Admitting: Urology

## 2020-06-08 DIAGNOSIS — R1013 Epigastric pain: Secondary | ICD-10-CM | POA: Diagnosis not present

## 2020-06-08 DIAGNOSIS — Z85028 Personal history of other malignant neoplasm of stomach: Secondary | ICD-10-CM | POA: Diagnosis not present

## 2020-06-08 DIAGNOSIS — Z86010 Personal history of colonic polyps: Secondary | ICD-10-CM | POA: Diagnosis not present

## 2020-06-22 ENCOUNTER — Other Ambulatory Visit: Payer: Self-pay

## 2020-06-22 ENCOUNTER — Ambulatory Visit (INDEPENDENT_AMBULATORY_CARE_PROVIDER_SITE_OTHER): Payer: Medicaid Other | Admitting: Urology

## 2020-06-22 ENCOUNTER — Encounter: Payer: Self-pay | Admitting: Urology

## 2020-06-22 VITALS — BP 128/74 | HR 68 | Ht 70.0 in | Wt 187.0 lb

## 2020-06-22 DIAGNOSIS — N401 Enlarged prostate with lower urinary tract symptoms: Secondary | ICD-10-CM | POA: Diagnosis not present

## 2020-06-22 LAB — BLADDER SCAN AMB NON-IMAGING: Scan Result: 76

## 2020-06-22 MED ORDER — TAMSULOSIN HCL 0.4 MG PO CAPS: 0.4000 mg | ORAL_CAPSULE | Freq: Every day | ORAL | 3 refills | Status: DC

## 2020-06-22 NOTE — Progress Notes (Signed)
   06/22/2020 11:00 AM   Andrew Peters [birth year] [id number]  Referring provider: Dion Body, MD [facility] [place],  [place] [address]  Chief Complaint  Patient presents with  . Benign Prostatic Hypertrophy    HPI: 55 y.o. male presents for 1 month follow-up.   Initially seen 05/13/2020 with a 1 month history of urinary hesitancy and straining to urinate  Benign DRE  Started tamsulosin and he has noted significant improvement in his voiding pattern with significant improvement in his hesitancy and straining  Currently satisfied with his voiding pattern   PMH: Past Medical History:  Diagnosis Date  . 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 COLONOSCOPY WITH PROPOFOL N/A 11/13/2014   Procedure: COLONOSCOPY WITH PROPOFOL;  Surgeon: Hulen Luster, MD;  Location: [facility] ENDOSCOPY;  Service: Gastroenterology;  Laterality: N/A;  . ESOPHAGOGASTRODUODENOSCOPY (EGD) WITH PROPOFOL N/A 11/13/2014   Procedure: ESOPHAGOGASTRODUODENOSCOPY (EGD) WITH PROPOFOL;  Surgeon: Hulen Luster, MD;  Location: [facility] ENDOSCOPY;  Service: Gastroenterology;  Laterality: N/A;  . 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    Home Medications:  Allergies as of 06/22/2020   No Known Allergies     Medication List       Accurate as of Jun 22, 2020 11:00 AM. If you have any questions, ask your nurse or doctor.        ibuprofen 600 MG tablet Commonly known as: ADVIL Take 1 tablet (600 mg total) by mouth every 8 (eight) hours as needed.   tamsulosin 0.4 MG Caps capsule Commonly known as: FLOMAX Take 1 capsule (0.4 mg total) by mouth daily.       Allergies: No Known Allergies  Family History: No family history on file.  Social History:  reports that he has been smoking cigarettes. He has been smoking about 0.25 packs per day. He has never used smokeless tobacco.  He reports current alcohol use of about 42.0 standard drinks of alcohol per week. He reports that he does not use drugs.   Physical Exam: BP 128/74   Pulse 68   Ht 5\' 10"  (1.778 m)   Wt 187 lb (84.8 kg)   BMI 26.83 kg/m   Constitutional:  Alert and oriented, No acute distress. HEENT: [place] AT, moist mucus membranes.  Trachea midline, no masses. Cardiovascular: No clubbing, cyanosis, or edema. Respiratory: Normal respiratory effort, no increased work of breathing. Neurologic: Grossly intact, no focal deficits, moving all 4 extremities. Psychiatric: Normal mood and affect.   Assessment & Plan:    1. Benign prostatic hyperplasia with LUTS symptom details unspecified  Marked improvement on tamsulosin and currently satisfied with his voiding pattern  Refills sent to pharmacy  1 year follow-up with bladder scan   Abbie Sons, MD  [facility] [address], [place], [place] [address] [phone]

## 2020-08-04 ENCOUNTER — Emergency Department
Admission: EM | Admit: 2020-08-04 | Discharge: 2020-08-04 | Disposition: A | Discharge status: Home / Self Care | Payer: Medicaid Other | Attending: Emergency Medicine | Admitting: Emergency Medicine

## 2020-08-04 ENCOUNTER — Other Ambulatory Visit: Payer: Self-pay

## 2020-08-04 ENCOUNTER — Emergency Department: Payer: Medicaid Other

## 2020-08-04 DIAGNOSIS — R569 Unspecified convulsions: Secondary | ICD-10-CM | POA: Diagnosis not present

## 2020-08-04 DIAGNOSIS — F17210 Nicotine dependence, cigarettes, uncomplicated: Secondary | ICD-10-CM | POA: Insufficient documentation

## 2020-08-04 DIAGNOSIS — R55 Syncope and collapse: Secondary | ICD-10-CM | POA: Insufficient documentation

## 2020-08-04 DIAGNOSIS — R4182 Altered mental status, unspecified: Secondary | ICD-10-CM | POA: Diagnosis not present

## 2020-08-04 DIAGNOSIS — R519 Headache, unspecified: Secondary | ICD-10-CM | POA: Diagnosis not present

## 2020-08-04 DIAGNOSIS — Z85028 Personal history of other malignant neoplasm of stomach: Secondary | ICD-10-CM | POA: Insufficient documentation

## 2020-08-04 DIAGNOSIS — R9431 Abnormal electrocardiogram [ECG] [EKG]: Secondary | ICD-10-CM | POA: Diagnosis not present

## 2020-08-04 LAB — URINE DRUG SCREEN, QUALITATIVE (ARMC ONLY)
Amphetamines, Ur Screen: NOT DETECTED
Barbiturates, Ur Screen: NOT DETECTED
Benzodiazepine, Ur Scrn: NOT DETECTED
Cannabinoid 50 Ng, Ur ~~LOC~~: NOT DETECTED
Cocaine Metabolite,Ur ~~LOC~~: POSITIVE — AB
MDMA (Ecstasy)Ur Screen: NOT DETECTED
Methadone Scn, Ur: NOT DETECTED
Opiate, Ur Screen: NOT DETECTED
Phencyclidine (PCP) Ur S: NOT DETECTED
Tricyclic, Ur Screen: NOT DETECTED

## 2020-08-04 LAB — CBC
HCT: 43.3 % (ref 39.0–52.0)
Hemoglobin: 15.5 g/dL (ref 13.0–17.0)
MCH: 32.3 pg (ref 26.0–34.0)
MCHC: 35.8 g/dL (ref 30.0–36.0)
MCV: 90.2 fL (ref 80.0–100.0)
Platelets: 216 10*3/uL (ref 150–400)
RBC: 4.8 MIL/uL (ref 4.22–5.81)
RDW: 12.5 % (ref 11.5–15.5)
WBC: 6.1 10*3/uL (ref 4.0–10.5)
nRBC: 0 % (ref 0.0–0.2)

## 2020-08-04 LAB — COMPREHENSIVE METABOLIC PANEL
ALT: 22 U/L (ref 0–44)
AST: 25 U/L (ref 15–41)
Albumin: 4.6 g/dL (ref 3.5–5.0)
Alkaline Phosphatase: 63 U/L (ref 38–126)
Anion gap: 7 (ref 5–15)
BUN: 19 mg/dL (ref 6–20)
CO2: 26 mmol/L (ref 22–32)
Calcium: 9.1 mg/dL (ref 8.9–10.3)
Chloride: 105 mmol/L (ref 98–111)
Creatinine, Ser: 1.33 mg/dL — ABNORMAL HIGH (ref 0.61–1.24)
GFR, Estimated: >60 mL/min (ref >60)
Glucose, Bld: 98 mg/dL (ref 70–99)
Potassium: 3.6 mmol/L (ref 3.5–5.1)
Sodium: 138 mmol/L (ref 135–145)
Total Bilirubin: 0.8 mg/dL (ref 0.3–1.2)
Total Protein: 6.9 g/dL (ref 6.5–8.1)

## 2020-08-04 LAB — TROPONIN I (HIGH SENSITIVITY): Troponin I (High Sensitivity): 5 ng/L (ref <18)

## 2020-08-04 MED ORDER — LEVETIRACETAM IN NACL 1000 MG/100ML IV SOLN
1000.0000 mg | Freq: Once | INTRAVENOUS | Status: AC
  Administered 2020-08-04: 06:00:00 1000 mg via INTRAVENOUS
  Filled 2020-08-04: qty 100

## 2020-08-04 MED ORDER — LEVETIRACETAM 500 MG PO TABS
500.0000 mg | ORAL_TABLET | Freq: Two times a day (BID) | ORAL | 1 refills | Status: DC
Start: 2020-08-04 — End: 2022-02-02

## 2020-08-04 MED ORDER — KETOROLAC TROMETHAMINE 30 MG/ML IJ SOLN
60.0000 mg | Freq: Once | INTRAMUSCULAR | Status: AC
  Administered 2020-08-04: 05:00:00 60 mg via INTRAMUSCULAR
  Filled 2020-08-04: qty 2

## 2020-08-04 NOTE — ED Triage Notes (Signed)
Pt states Friday he was at a cookout and friends stated that he started "talking out of his head" and then became unresponsive. They state that he started shaking and then snoring. Pt took some time to come back to his baseline. No hx of seizures, pt states since he has had  left sided headache.

## 2020-08-04 NOTE — Discharge Instructions (Addendum)
In the state of [place] you are not allowed to drive for 6 months after your last seizure.  You need to follow-up closely with your primary care physician and neurology as you will need an outpatient MRI of your brain and EEG.  I recommend that you avoid drug and alcohol use as this can contribute to seizure activity.  Your labs, head CT were reassuring today.

## 2020-08-04 NOTE — ED Provider Notes (Signed)
[facility] Emergency Department Provider Note  ____________________________________________   Event Date/Time   First MD Initiated Contact with Patient 08/04/20 0423     (approximate)  I have reviewed the triage vital signs and the nursing notes.   HISTORY  Chief Complaint Fall and Loss of Consciousness    HPI Andrew Peters is a 55 y.o. male who presents to the emergency department with a possible seizure that occurred 4 days ago.  States he was with friends when he had an episode where he started "talking funny" and then lost consciousness.  States his friends told him he was shaking and unresponsive for several minutes.  He reports being incontinent of bowel.  No tongue biting or urinary incontinence.  States he was sitting in a chair when this happened.  Does not think that he fell to the ground or hit his head.  No recent head injury.  Not on blood thinners.  States for the past several weeks he has had intermittent left-sided headaches.  No numbness, tingling or focal weakness.  No chest pain, shortness of breath.  States after this happened his friends called EMS but when patient came to he refused transport.  He denies any known history of seizures.  Does have previous history of substance abuse.  States he does drink alcohol occasionally but not every day.  No history of DTs, alcohol withdrawal seizures.        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    Past Surgical History:  Procedure Laterality Date   COLONOSCOPY WITH PROPOFOL N/A 11/13/2014   Procedure: COLONOSCOPY WITH PROPOFOL;  Surgeon: Hulen Luster, MD;  Location: [facility] ENDOSCOPY;  Service: Gastroenterology;   Laterality: N/A;   ESOPHAGOGASTRODUODENOSCOPY (EGD) WITH PROPOFOL N/A 11/13/2014   Procedure: ESOPHAGOGASTRODUODENOSCOPY (EGD) WITH PROPOFOL;  Surgeon: Hulen Luster, MD;  Location: [facility] ENDOSCOPY;  Service: Gastroenterology;  Laterality: N/A;   STOMACH SURGERY     Removal of half of stomach due to cancer 2013 0r 2014.    Prior to Admission medications   Medication Sig Start Date End Date Taking? Authorizing Provider  levETIRAcetam (KEPPRA) 500 MG tablet Take 1 tablet (500 mg total) by mouth 2 (two) times daily. 08/04/20  Yes Mats Jeanlouis, Delice Bison, DO  ibuprofen (ADVIL) 600 MG tablet Take 1 tablet (600 mg total) by mouth every 8 (eight) hours as needed. Patient not taking: Reported on 05/12/2020 05/13/19   Sable Feil, PA-C  tamsulosin (FLOMAX) 0.4 MG CAPS capsule Take 1 capsule (0.4 mg total) by mouth daily. 06/22/20   Stoioff, Ronda Fairly, MD    Allergies Patient has no known allergies.  No family history on file.  Social History Social History   Tobacco Use   Smoking status: Some Days    Packs/day: 0.25    Pack years: 0.00    Types: Cigarettes   Smokeless tobacco: Never  Vaping Use   Vaping Use: Never used  Substance Use Topics   Alcohol use: Yes    Alcohol/week: 42.0 standard drinks    Types: 42 Cans of beer per week   Drug use: No    Review of Systems Constitutional: No fever. Eyes: No visual changes. ENT:  No sore throat. Cardiovascular: Denies chest pain. Respiratory: Denies shortness of breath. Gastrointestinal: No nausea, vomiting, diarrhea. Genitourinary: Negative for dysuria. Musculoskeletal: Negative for back pain. Skin: Negative for rash. Neurological: Negative for focal weakness or numbness.  ____________________________________________   PHYSICAL EXAM:  VITAL SIGNS: ED Triage Vitals [08/04/20 0131]  Enc Vitals Group     BP (!) 141/100     Pulse Rate 87     Resp 20     Temp 98.4 F (36.9 C)     Temp Source Oral     SpO2 97 %     Weight 180 lb (81.6 kg)      Height 5\' 7"  (1.702 m)     Head Circumference      Peak Flow      Pain Score 7     Pain Loc      Pain Edu?      Excl. in [place]?    CONSTITUTIONAL: Alert and oriented and responds appropriately to questions. Well-appearing; well-nourished HEAD: Normocephalic, atraumatic EYES: Conjunctivae clear, pupils appear equal, EOM appear intact ENT: normal nose; moist mucous membranes NECK: Supple, normal ROM, no meningismus CARD: RRR; S1 and S2 appreciated; no murmurs, no clicks, no rubs, no gallops RESP: Normal chest excursion without splinting or tachypnea; breath sounds clear and equal bilaterally; no wheezes, no rhonchi, no rales, no hypoxia or respiratory distress, speaking full sentences ABD/GI: Normal bowel sounds; non-distended; soft, non-tender, no rebound, no guarding, no peritoneal signs, no hepatosplenomegaly BACK: The back appears normal EXT: Normal ROM in all joints; no deformity noted, no edema; no cyanosis SKIN: Normal color for age and race; warm; no rash on exposed skin NEURO: Moves all extremities equally, sensation to light touch intact diffusely, cranial nerves II to XII intact, normal speech PSYCH: The patient's mood and manner are appropriate.  ____________________________________________   LABS (all labs ordered are listed, but only abnormal results are displayed)  Labs Reviewed  COMPREHENSIVE METABOLIC PANEL - Abnormal; Notable for the following components:      Result Value   Creatinine, Ser 1.33 (*)    All other components within normal limits  URINE DRUG SCREEN, QUALITATIVE ([facility] ONLY) - Abnormal; Notable for the following components:   Cocaine Metabolite,Ur [place] POSITIVE (*)    All other components within normal limits  CBC  TROPONIN I (HIGH SENSITIVITY)  TROPONIN I (HIGH SENSITIVITY)   ____________________________________________  EKG   EKG Interpretation  Date/Time:  Tuesday August 04 2020 01:25:54 EDT Ventricular Rate:  83 PR Interval:  146 QRS  Duration: 76 QT Interval:  338 QTC Calculation: 397 R Axis:   63 Text Interpretation: Sinus rhythm with marked sinus arrhythmia Otherwise normal ECG Confirmed by Pryor Curia [phone]) on 08/04/2020 4:25:40 AM         ____________________________________________  RADIOLOGY Jessie Foot Raygan Skarda, personally viewed and evaluated these images (plain radiographs) as part of my medical decision making, as well as reviewing the written report by the radiologist.  ED MD interpretation: CT head unremarkable.  Official radiology report(s): CT Head Wo Contrast  Result Date: 08/04/2020 CLINICAL DATA:  Altered mental status EXAM: CT HEAD WITHOUT CONTRAST TECHNIQUE: Contiguous axial images were obtained from the base of the skull through the vertex without intravenous contrast. COMPARISON:  None. FINDINGS: Brain: Normal anatomic configuration. No abnormal intra or extra-axial mass lesion or fluid collection. No abnormal mass effect or midline shift. No evidence of acute intracranial hemorrhage or infarct. Ventricular size is normal. Cerebellum unremarkable. Vascular: Unremarkable Skull: Intact Sinuses/Orbits:  Paranasal sinuses are clear. Remote left medial orbital wall fracture. Orbits are otherwise unremarkable. Other: Mastoid air cells and middle ear cavities are clear. IMPRESSION: No acute intracranial hemorrhage or infarct. Electronically Signed   By: Fidela Salisbury MD   On: 08/04/2020 03:19    ____________________________________________   PROCEDURES  Procedure(s) performed (including Critical Care):  Procedures   ____________________________________________   INITIAL IMPRESSION / ASSESSMENT AND PLAN / ED COURSE  As part of my medical decision making, I reviewed the following data within the [facility] notes reviewed and incorporated, Labs reviewed , EKG interpreted , Old EKG reviewed, Old chart reviewed, Radiograph reviewed , Notes from prior ED visits, and [place]  Controlled Substance Database         Patient here with possible syncopal event versus seizure.  Unfortunately there is no one at the bedside currently and he states he is not able to get in touch with friends that witnessed this episode 4 days ago at this time of night.  I discussed with him that this was a seizure that in the state of [place] he cannot drive for 6 months after this episode.  Given he was incontinent of bowel and reportedly "out for several minutes", I am concerned this was a potential seizure.  He verbalized understanding that he should not drive.  Given this was his first and only seizure, I do not feel he needs to be started on antiepileptics but do recommend close follow-up with a neurologist.  He states he does have a primary care physician as well.  Labs here are unremarkable.  EKG shows no ischemia, interval abnormality, arrhythmia.  He denies any preceding chest pain or shortness of breath.  States he was "talking out of my head" right before this happened.  CT head here shows no acute abnormality.  I feel he needs MRI and EEG but this can be done not emergently.  He has no focal neurologic deficits currently.  Will give IM Toradol for mild residual headache and reassess.  ED PROGRESS  Patient significant other now at bedside.  She did not witness this episode that happened 4 days ago but states a year ago he had similar episode where he lost consciousness, fell to the ground and was shaking for "15 minutes".  She states that he seemed confused afterwards and was incontinent of urine.  She states that she has seen seizures before and this look like a seizure to her.  States he refused to come to the hospital at that time.  Will load with IV Keppra here and start him on Keppra 500 mg twice daily.  We discussed that he should not be driving and he states he does not currently drive a vehicle.  Recommended close outpatient follow-up with neurology.  He is comfortable with this  plan.  At this time, I do not feel there is any life-threatening condition present. I have reviewed, interpreted and discussed all results (EKG, imaging, lab, urine as appropriate) and exam findings with patient/family. I have reviewed nursing notes and appropriate previous records.  I feel the patient is safe to be discharged home without further emergent workup and can continue workup as an outpatient as needed. Discussed usual and customary return precautions. Patient/family verbalize understanding and are comfortable with this plan.  Outpatient follow-up has been provided as needed. All questions have been answered.  ____________________________________________   FINAL CLINICAL IMPRESSION(S) / ED DIAGNOSES  Final diagnoses:  Seizure ([place])  ED Discharge Orders          Ordered    levETIRAcetam (KEPPRA) 500 MG tablet  2 times daily        08/04/20 0538            *Please note:  Gervase Colberg was evaluated in Emergency Department on 08/04/2020 for the symptoms described in the history of present illness. He was evaluated in the context of the global COVID-19 pandemic, which necessitated consideration that the patient might be at risk for infection with the SARS-CoV-2 virus that causes COVID-19. Institutional protocols and algorithms that pertain to the evaluation of patients at risk for COVID-19 are in a state of rapid change based on information released by regulatory bodies including the CDC and federal and state organizations. These policies and algorithms were followed during the patient's care in the ED.  Some ED evaluations and interventions may be delayed as a result of limited staffing during and the pandemic.*   Note:  This document was prepared using Dragon voice recognition software and may include unintentional dictation errors.    Cannon Arreola, Delice Bison, DO 08/04/20 [phone]

## 2020-08-06 DIAGNOSIS — G40909 Epilepsy, unspecified, not intractable, without status epilepticus: Secondary | ICD-10-CM | POA: Diagnosis not present

## 2020-09-04 DIAGNOSIS — K5730 Diverticulosis of large intestine without perforation or abscess without bleeding: Secondary | ICD-10-CM | POA: Diagnosis not present

## 2020-09-04 DIAGNOSIS — D125 Benign neoplasm of sigmoid colon: Secondary | ICD-10-CM | POA: Diagnosis not present

## 2020-09-04 DIAGNOSIS — K640 First degree hemorrhoids: Secondary | ICD-10-CM | POA: Diagnosis not present

## 2020-09-04 DIAGNOSIS — Z86010 Personal history of colonic polyps: Secondary | ICD-10-CM | POA: Diagnosis not present

## 2020-09-04 DIAGNOSIS — B9681 Helicobacter pylori [H. pylori] as the cause of diseases classified elsewhere: Secondary | ICD-10-CM | POA: Diagnosis not present

## 2020-09-04 DIAGNOSIS — K2950 Unspecified chronic gastritis without bleeding: Secondary | ICD-10-CM | POA: Diagnosis not present

## 2020-09-04 DIAGNOSIS — D123 Benign neoplasm of transverse colon: Secondary | ICD-10-CM | POA: Diagnosis not present

## 2020-09-04 DIAGNOSIS — Z903 Acquired absence of stomach [part of]: Secondary | ICD-10-CM | POA: Diagnosis not present

## 2020-12-09 DIAGNOSIS — Z0000 Encounter for general adult medical examination without abnormal findings: Secondary | ICD-10-CM | POA: Diagnosis not present

## 2020-12-09 DIAGNOSIS — Z114 Encounter for screening for human immunodeficiency virus [HIV]: Secondary | ICD-10-CM | POA: Diagnosis not present

## 2020-12-09 DIAGNOSIS — Z125 Encounter for screening for malignant neoplasm of prostate: Secondary | ICD-10-CM | POA: Diagnosis not present

## 2020-12-18 ENCOUNTER — Other Ambulatory Visit: Payer: Self-pay

## 2020-12-18 ENCOUNTER — Emergency Department
Admission: EM | Admit: 2020-12-18 | Discharge: 2020-12-18 | Disposition: A | Discharge status: Home / Self Care | Payer: Medicaid Other | Attending: Emergency Medicine | Admitting: Emergency Medicine

## 2020-12-18 ENCOUNTER — Emergency Department: Payer: Medicaid Other

## 2020-12-18 DIAGNOSIS — Z20822 Contact with and (suspected) exposure to covid-19: Secondary | ICD-10-CM | POA: Diagnosis not present

## 2020-12-18 DIAGNOSIS — Z85028 Personal history of other malignant neoplasm of stomach: Secondary | ICD-10-CM | POA: Insufficient documentation

## 2020-12-18 DIAGNOSIS — F17210 Nicotine dependence, cigarettes, uncomplicated: Secondary | ICD-10-CM | POA: Diagnosis not present

## 2020-12-18 DIAGNOSIS — J111 Influenza due to unidentified influenza virus with other respiratory manifestations: Secondary | ICD-10-CM | POA: Diagnosis not present

## 2020-12-18 DIAGNOSIS — J101 Influenza due to other identified influenza virus with other respiratory manifestations: Secondary | ICD-10-CM | POA: Insufficient documentation

## 2020-12-18 DIAGNOSIS — Y9289 Other specified places as the place of occurrence of the external cause: Secondary | ICD-10-CM | POA: Diagnosis not present

## 2020-12-18 DIAGNOSIS — R509 Fever, unspecified: Secondary | ICD-10-CM | POA: Diagnosis not present

## 2020-12-18 DIAGNOSIS — W07XXXA Fall from chair, initial encounter: Secondary | ICD-10-CM | POA: Insufficient documentation

## 2020-12-18 DIAGNOSIS — R059 Cough, unspecified: Secondary | ICD-10-CM | POA: Diagnosis not present

## 2020-12-18 DIAGNOSIS — M7918 Myalgia, other site: Secondary | ICD-10-CM | POA: Diagnosis present

## 2020-12-18 LAB — RESP PANEL BY RT-PCR (FLU A&B, COVID) ARPGX2
Influenza A by PCR: POSITIVE — AB
Influenza B by PCR: NEGATIVE
SARS Coronavirus 2 by RT PCR: NEGATIVE

## 2020-12-18 MED ORDER — PSEUDOEPH-BROMPHEN-DM 30-2-10 MG/5ML PO SYRP
5.0000 mL | ORAL_SOLUTION | Freq: Four times a day (QID) | ORAL | 0 refills | Status: DC | PRN
Start: 2020-12-18 — End: 2022-02-02

## 2020-12-18 MED ORDER — BENZONATATE 100 MG PO CAPS: ORAL_CAPSULE | ORAL | 0 refills | Status: DC

## 2020-12-18 MED ORDER — METOCLOPRAMIDE HCL 10 MG PO TABS
10.0000 mg | ORAL_TABLET | Freq: Once | ORAL | Status: AC
  Administered 2020-12-18: 10 mg via ORAL
  Filled 2020-12-18: qty 1

## 2020-12-18 MED ORDER — PREDNISONE 20 MG PO TABS: 40.0000 mg | ORAL_TABLET | Freq: Every day | ORAL | 0 refills | Status: AC

## 2020-12-18 MED ORDER — ONDANSETRON 4 MG PO TBDP: 4.0000 mg | ORAL_TABLET | Freq: Three times a day (TID) | ORAL | 0 refills | Status: DC | PRN

## 2020-12-18 MED ORDER — ACETAMINOPHEN 325 MG PO TABS
650.0000 mg | ORAL_TABLET | Freq: Once | ORAL | Status: AC
  Administered 2020-12-18: 650 mg via ORAL
  Filled 2020-12-18: qty 2

## 2020-12-18 NOTE — Discharge Instructions (Signed)
Take the prescription meds as directed.  Follow-up with your primary provider or return to the ED as needed.

## 2020-12-18 NOTE — ED Notes (Signed)
Pt's family states he "passed out." This rn and steven, rn assessed pt laying on floor in lobby, no lac or injury noted. Pt assisted to wheelchair and taken to triage to be assessed by Thayer Headings, [place].

## 2020-12-18 NOTE — ED Provider Notes (Signed)
Emergency Medicine Provider Triage Evaluation Note  Andrew Peters , a 55 y.o. male  was evaluated in triage.  Pt complains of days of body aches, nausea, and intermittent malaise.  Patient had similar symptoms 2 weeks ago, and assumed he had flu due to a flu positive contact.  He did not seek care at that time.  Patient apparently had a near syncopal episode while sitting in the chair in the waiting room.  Review of Systems  Positive: Cough, malaise Negative: CP, COB  Physical Exam  BP (!) 131/92 (BP Location: Left Arm)   Pulse (!) 112   Temp 99.4 F (37.4 C) (Oral)   Resp 20   Ht 5\' 7"  (1.702 m)   Wt 83 kg   SpO2 94%   BMI 28.66 kg/m  Gen:   Awake, no distress  NAD Resp:  Normal effort CTA. Intermittent cough MSK:   Moves extremities without difficulty  Other:  CVS: RRR  Medical Decision Making  Medically screening exam initiated at 7:23 PM.  Appropriate orders placed.  Andrew Peters was informed that the remainder of the evaluation will be completed by another provider, this initial triage assessment does not replace that evaluation, and the importance of remaining in the ED until their evaluation is complete.  Patient with ED evaluation of malaise, body aches, and flulike symptoms.   Andrew Needles, PA-C 12/18/20 1937    Nance Pear, MD 12/18/20 2049

## 2020-12-18 NOTE — ED Provider Notes (Signed)
[facility] Emergency Department Provider Note ____________________________________________  Time seen: 1915  I have reviewed the triage vital signs and the nursing notes.  HISTORY  Chief Complaint  Generalized Body Aches   HPI Andrew Peters is a 55 y.o. male presents to the ED with several days of fatigue, malaise, generalized body aches and cough.  Patient reports similar contacts in his household contacts.  He also reports he had a flu positive contact 2 weeks prior and had similar symptoms at that time which had since resolved.  Patient apparently fell out of his chair in the waiting room, and his family who was present reported he passed out.  Patient denies any syncopal episode.  He denies any head injury or LOC.  He also denies any other injury related to the slip out of the chair as he was leaning forward.  Past Medical History:  Diagnosis Date   Cancer [facility]) 2012   Gastrectomy    Patient Active Problem List   Diagnosis Date Noted   Tobacco abuse 05/12/2020   Vitamin B12 deficiency 05/12/2020   Urinary retention 05/12/2020   Epigastric pain 08/20/2015   Rectal bleeding 10/30/2014   Gastric cancer ([place]) 10/04/2011    Past Surgical History:  Procedure Laterality Date   COLONOSCOPY WITH PROPOFOL N/A 11/13/2014   Procedure: COLONOSCOPY WITH PROPOFOL;  Surgeon: Hulen Luster, MD;  Location: [facility] ENDOSCOPY;  Service: Gastroenterology;  Laterality: N/A;   ESOPHAGOGASTRODUODENOSCOPY (EGD) WITH PROPOFOL N/A 11/13/2014   Procedure: ESOPHAGOGASTRODUODENOSCOPY (EGD) WITH PROPOFOL;  Surgeon: Hulen Luster, MD;  Location: [facility] ENDOSCOPY;  Service: Gastroenterology;  Laterality: N/A;   STOMACH SURGERY     Removal of half of stomach due to cancer 2013 0r 2014.    Prior to Admission medications   Medication Sig Start Date End Date Taking? Authorizing Provider  benzonatate (TESSALON PERLES) 100 MG capsule Take 1-2 tabs TID prn cough 12/18/20  Yes Kamelia Lampkins, Dannielle Karvonen, PA-C  brompheniramine-pseudoephedrine-DM 30-2-10 MG/5ML syrup Take 5 mLs by mouth 4 (four) times daily as needed. 12/18/20  Yes Tyna Huertas, Dannielle Karvonen, PA-C  ondansetron (ZOFRAN ODT) 4 MG disintegrating tablet Take 1 tablet (4 mg total) by mouth every 8 (eight) hours as needed. 12/18/20  Yes Caley Ciaramitaro, Dannielle Karvonen, PA-C  predniSONE (DELTASONE) 20 MG tablet Take 2 tablets (40 mg total) by mouth daily with breakfast for 5 days. 12/18/20 12/23/20 Yes Butch Otterson, Dannielle Karvonen, PA-C  ibuprofen (ADVIL) 600 MG tablet Take 1 tablet (600 mg total) by mouth every 8 (eight) hours as needed. Patient not taking: Reported on 05/12/2020 05/13/19   Sable Feil, PA-C  levETIRAcetam (KEPPRA) 500 MG tablet Take 1 tablet (500 mg total) by mouth 2 (two) times daily. 08/04/20   Ward, Delice Bison, DO  tamsulosin (FLOMAX) 0.4 MG CAPS capsule Take 1 capsule (0.4 mg total) by mouth daily. 06/22/20   Stoioff, Ronda Fairly, MD    Allergies Patient has no known allergies.  No family history on file.  Social History Social History   Tobacco Use   Smoking status: Some Days    Packs/day: 0.25    Types: Cigarettes   Smokeless tobacco: Never  Vaping Use   Vaping Use: Never used  Substance Use Topics   Alcohol use: Yes    Alcohol/week: 42.0 standard drinks    Types: 42 Cans of beer per week   Drug use: No    Review of Systems  Constitutional: Negative for fever. Eyes: Negative for visual changes.  ENT: Negative for sore throat. Cardiovascular: Negative for chest pain. Respiratory: Negative for shortness of breath. Gastrointestinal: Negative for abdominal pain and diarrhea.  Reports nausea and vomiting  Genitourinary: Negative for dysuria. Musculoskeletal: Negative for back pain.  Reports generalized body aches. Skin: Negative for rash. Neurological: Negative for headaches, focal weakness or numbness. ____________________________________________  PHYSICAL EXAM:  VITAL SIGNS: ED Triage Vitals  Enc Vitals  Group     BP 12/18/20 1910 (!) 131/92     Pulse Rate 12/18/20 1910 (!) 112     Resp 12/18/20 1910 20     Temp 12/18/20 1910 99.4 F (37.4 C)     Temp Source 12/18/20 1910 Oral     SpO2 12/18/20 1910 94 %     Weight 12/18/20 1910 183 lb (83 kg)     Height 12/18/20 1910 5\' 7"  (1.702 m)     Head Circumference --      Peak Flow --      Pain Score 12/18/20 1922 8     Pain Loc --      Pain Edu? --      Excl. in [place]? --     Constitutional: Alert and oriented. Well appearing and in no distress. GCS = 15 Head: Normocephalic and atraumatic. Eyes: Conjunctivae are normal. PERRL. Normal extraocular movements Neck: Supple. No thyromegaly. Cardiovascular: Normal rate, regular rhythm. Normal distal pulses. Respiratory: Normal respiratory effort. No wheezes/rales/rhonchi. Gastrointestinal: Soft and nontender. No distention. Musculoskeletal: Nontender with normal range of motion in all extremities.  Neurologic: CN II-XII grossly intact.  Normal gait without ataxia. Normal speech and language. No gross focal neurologic deficits are appreciated. Skin:  Skin is warm, dry and intact. No rash noted. Psychiatric: Mood and affect are normal. Patient exhibits appropriate insight and judgment. ____________________________________________    {LABS (pertinent positives/negatives)  Labs Reviewed  RESP PANEL BY RT-PCR (FLU A&B, COVID) ARPGX2 - Abnormal; Notable for the following components:      Result Value   Influenza A by PCR POSITIVE (*)    All other components within normal limits  ____________________________________________  {EKG  ____________________________________________   RADIOLOGY Official radiology report(s): DG Chest 2 View  Result Date: 12/18/2020 CLINICAL DATA:  Cough and fever EXAM: CHEST - 2 VIEW COMPARISON:  Chest x-ray 06/13/2011, CT chest 06/12/2012 FINDINGS: The heart and mediastinal contours consistent with a right aortic arch is unchanged. No focal consolidation. No  pulmonary edema. No pleural effusion. No pneumothorax. No acute osseous abnormality. IMPRESSION: No active cardiopulmonary disease. Electronically Signed   By: Iven Finn M.D.   On: 12/18/2020 19:43   ____________________________________________  PROCEDURES  Tylenol 650 mg PO Reglan 10 mg PO  Procedures ____________________________________________   INITIAL IMPRESSION / ASSESSMENT AND PLAN / ED COURSE  As part of my medical decision making, I reviewed the following data within the [place] reviewed as noted, Radiograph reviewed NAD, and Notes from prior ED visits   DDX: CAP, influenza, Covid  Patient with ED evaluation of generalized malaise and bodies for the last several days.  Patient presents for concern of a possible flu.  His viral panel screen does confirm influenza A.  Patient stable condition we discharged with instructions to take over-the-counter medicines for fever management, and the prescription medicines including Tessalon Perles, Bromfed syrup, Zofran, and prednisone provided.  Follow with primary provider return to the ED if needed.  Andrew Peters was evaluated in Emergency Department on 12/18/2020 for the symptoms described in the history of present  illness. He was evaluated in the context of the global COVID-19 pandemic, which necessitated consideration that the patient might be at risk for infection with the SARS-CoV-2 virus that causes COVID-19. Institutional protocols and algorithms that pertain to the evaluation of patients at risk for COVID-19 are in a state of rapid change based on information released by regulatory bodies including the CDC and federal and state organizations. These policies and algorithms were followed during the patient's care in the ED. ____________________________________________  FINAL CLINICAL IMPRESSION(S) / ED DIAGNOSES  Final diagnoses:  Influenza      Carmie End, Dannielle Karvonen, PA-C 12/18/20 2044     Nance Pear, MD 12/18/20 2049

## 2020-12-18 NOTE — ED Notes (Signed)
Dc ppw provided. At home meds reviewed. PT  denies any questions and provided signature for dc. Pt assisted off unit on foot with family. Dc vs refused

## 2020-12-18 NOTE — ED Triage Notes (Signed)
Pt c/o generalized body aches for 2 days. Pt also c/o nausea and vomiting. Per pt, he had the flu 2 weeks ago and it feels the same.

## 2021-04-16 DIAGNOSIS — G40909 Epilepsy, unspecified, not intractable, without status epilepticus: Secondary | ICD-10-CM | POA: Diagnosis not present

## 2021-04-16 DIAGNOSIS — E781 Pure hyperglyceridemia: Secondary | ICD-10-CM | POA: Diagnosis not present

## 2021-04-16 DIAGNOSIS — R7303 Prediabetes: Secondary | ICD-10-CM | POA: Diagnosis not present

## 2021-04-16 DIAGNOSIS — N401 Enlarged prostate with lower urinary tract symptoms: Secondary | ICD-10-CM | POA: Diagnosis not present

## 2021-04-22 ENCOUNTER — Ambulatory Visit: Payer: Self-pay | Admitting: General Surgery

## 2021-04-22 DIAGNOSIS — L720 Epidermal cyst: Secondary | ICD-10-CM | POA: Diagnosis not present

## 2021-04-22 NOTE — H&P (Signed)
PATIENT PROFILE: ?Andrew Peters is a 56 y.o. male who presents to the Clinic for consultation at the request of Dr. Netty Starring for evaluation of epidermal inclusion cyst. ? ?PCP:  Andrew Body, MD ? ?HISTORY OF PRESENT ILLNESS: ?Andrew Peters reports having a cyst on the left posterior thigh since 73-monthago.  He endorses that the cyst has been growing rapidly.  He endorses that is getting very painful.  Pain is exacerbated by pressure when he sit down on the cyst.  Denies any elevating factor.  There is no pain radiation.  Patient denies any previous infection. ? ? ?PROBLEM LIST: ?Problem List  Date Reviewed: 04/16/2021  ? ?       Noted  ? Essential hypertension 04/16/2021  ? Benign prostatic hyperplasia (BPH) with straining on urination 12/16/2020  ? 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 Hyperlipemia, mixed (Trig 225, LDL 149 - 04/16/21) 12/16/2020  ? Tobacco use (1/2 pack per week) 12/16/2020  ? Seizure disorder (CMS-HCC) 08/06/2020  ? History of gastric cancer 07/01/2014  ? ? ?GENERAL REVIEW OF SYSTEMS:  ? ?General ROS: negative for - chills, fatigue, fever, weight gain or weight loss ?Allergy and Immunology ROS: negative for - hives  ?Hematological and Lymphatic ROS: negative for - bleeding problems or bruising, negative for palpable nodes ?Endocrine ROS: negative for - heat or cold intolerance, hair changes ?Respiratory ROS: negative for - cough, shortness of breath or wheezing ?Cardiovascular ROS: no chest pain or palpitations ?GI ROS: negative for nausea, vomiting, abdominal pain, diarrhea, constipation ?Musculoskeletal ROS: negative for - joint swelling or muscle pain ?Neurological ROS: negative for - confusion, syncope ?Dermatological ROS: negative for pruritus and rash ?Psychiatric: negative for anxiety, depression, difficulty sleeping and memory loss ? ?MEDICATIONS: ?Current Outpatient Medications  ?Medication Sig Dispense Refill  ? chlorthalidone 25 MG tablet  Take 1 tablet (25 mg total) by mouth once daily 30 tablet 3  ? levETIRAcetam (KEPPRA) 500 MG tablet Take 1 tablet (500 mg total) by mouth 2 (two) times daily 180 tablet 1  ? lovastatin (MEVACOR) 20 MG tablet Take 1 tablet (20 mg total) by mouth daily with dinner 30 tablet 3  ? tamsulosin (FLOMAX) 0.4 mg capsule Take 1 capsule (0.4 mg total) by mouth once daily 90 capsule 1  ? ?No current facility-administered medications for this visit.  ? ? ?ALLERGIES: ?Patient has no known allergies. ? ?PAST MEDICAL HISTORY: ?Past Medical History:  ?Diagnosis Date  ? Allergic state   ? Anxiety   ? Chronic gastritis 11/13/2014  ? Depression   ? Gastric ulcer 11/13/2014  ? History of stomach cancer   ? Kidney stones   ? Stomach cancer (CMS-HCC)   ? Tubular adenoma of colon 11/13/2014  ? ? ?PAST SURGICAL HISTORY: ?Past Surgical History:  ?Procedure Laterality Date  ? COLONOSCOPY  08/19/2011  ? Normal  ? EGD  08/19/2011  ? Stomach cancer/+ H. pylori  ? EGD  12/03/2013  ? PHx of gastric neoplasm/Normal exam/No Repeat/PYO  ? COLONOSCOPY  11/13/2014  ? Tubular adenoma/Repeat 549yrPYO  ? EGD  11/13/2014  ? Chronic gastritis/Gastric ulcer clean base/PHx of gastric cancer/No repeat/PYO  ? COLONOSCOPY  09/04/2020  ? Tubular adenomas/PHx CP/Repeat 5y46yrKT  ? EGD  09/04/2020  ? Gastritis/+H. Pylori/PHx Gastric cancer/Repeat 62yr25yrT  ? CYSTECTOMY    ? multiple, from neck and extremities  ? stomach cancer surgery    ?  ? ?FAMILY HISTORY: ?Family History  ?Problem Relation Age of Onset  ?  Breast cancer Mother   ? Myocardial Infarction (Heart attack) Father   ?  ? ?SOCIAL HISTORY: ?Social History  ? ?Socioeconomic History  ? Marital status: Single  ?Tobacco Use  ? Smoking status: Former  ?  Years: 38.00  ?  Types: Cigarettes  ?  Quit date: 09/18/2015  ?  Years since quitting: 5.5  ? Smokeless tobacco: Never  ? Tobacco comments:  ?  Smoked 2-3 cigarettes daily  ?Substance and Sexual Activity  ? Alcohol use: Yes  ?  Comment: Drinks, on average, 2-3  beers daily  ? Drug use: No  ?Social History Narrative  ? Marital Status- Single  ? Lives with friends  ? Employment- Disability   ? Exercise hx- Rides bicycles, roller-skates, walks  ? Religious Affiliation- [employer]  ? ? ?PHYSICAL EXAM: ?Vitals:  ? 04/22/21 1438  ?BP: 125/73  ?Pulse: 99  ? ?Peters mass index is 26.73 kg/m?. ?Weight: 82.1 kg (181 lb)  ? ?GENERAL: Alert, active, oriented x3 ? ?HEENT: Pupils equal reactive to light. Extraocular movements are intact. Sclera clear. Palpebral conjunctiva normal red color.Pharynx clear. ? ?NECK: Supple with no palpable mass and no adenopathy. ? ?LUNGS: Sound clear with no rales rhonchi or wheezes. ? ?HEART: Regular rhythm S1 and S2 without murmur. ? ?ABDOMEN: Soft and depressible, nontender with no palpable mass, no hepatomegaly.  ? ?EXTREMITIES: Well-developed well-nourished symmetrical with no dependent edema.  Left posterior 4 cm cyst, round, mobile, soft, no skin changes. ? ?NEUROLOGICAL: Awake alert oriented, facial expression symmetrical, moving all extremities. ? ?REVIEW OF DATA: ?I have reviewed the following data today: ?Appointment on 04/16/2021  ?Component Date Value  ? Glucose 04/16/2021 93   ? Sodium 04/16/2021 141   ? Potassium 04/16/2021 4.2   ? Chloride 04/16/2021 106   ? Carbon Dioxide (CO2) 04/16/2021 29.6   ? Urea Nitrogen (BUN) 04/16/2021 16   ? Creatinine 04/16/2021 1.2   ? Glomerular Filtration Ra* 04/16/2021 63   ? Calcium 04/16/2021 9.5   ? AST  04/16/2021 21   ? ALT  04/16/2021 22   ? Alk Phos (alkaline Phosp* 04/16/2021 69   ? Albumin 04/16/2021 4.4   ? Bilirubin, Total 04/16/2021 0.8   ? Protein, Total 04/16/2021 6.5   ? A/G Ratio 04/16/2021 2.1   ? Cholesterol, Total 04/16/2021 252 (H)   ? Triglyceride 04/16/2021 225 (H)   ? HDL (High Density Lipopr* 04/16/2021 57.8   ? LDL Calculated 04/16/2021 149 (H)   ? VLDL Cholesterol 04/16/2021 45   ? Cholesterol/HDL Ratio 04/16/2021 4.4   ? Hemoglobin A1C 04/16/2021 5.9 (H)   ? Average Blood Glucose (C*  04/16/2021 123   ?  ? ?ASSESSMENT: ?Andrew Peters is a 56 y.o. male presenting for consultation for epidermal inclusion cyst. The patient has a mass on the left posterior thigh that is causing some pain and discomfort on pressure and rapidly growing. Patient oriented about the diagnosis of epidermal inclusion cyst, not a malignant lesion but if it is causing symptoms, excision can be considered. Patient oriented about the procedure, benefits and risk.  ? ?Patient offered to have the cyst removed in the office but he endorses that she would not tolerate the procedure if it is not done under anesthesia.  I think that this patient should have excision of the cyst under monitored anesthesia care. ? ?Epidermal inclusion cyst [L72.0] ? ?PLAN: ?1.  Excision of posterior left thigh mass.11406 ?2.  Avoid taking any aspirin 5 days before the procedure ?3.  Contact us if you have any concern ? ?Patient verbalized understanding, all questions were answered, and were agreeable with the plan outlined above.  ? ?Herbert Pun, MD ? ?Electronically signed by Herbert Pun, MD ? ?

## 2021-04-22 NOTE — H&P (View-Only) (Signed)
PATIENT PROFILE: ?Andrew Peters is a 56 y.o. male who presents to the Clinic for consultation at the request of Dr. Linthavong for evaluation of epidermal inclusion cyst. ? ?PCP:  Linthavong, [place], MD ? ?HISTORY OF PRESENT ILLNESS: ?Mr. [name] reports having a cyst on the left posterior thigh since 6-month ago.  He endorses that the cyst has been growing rapidly.  He endorses that is getting very painful.  Pain is exacerbated by pressure when he sit down on the cyst.  Denies any elevating factor.  There is no pain radiation.  Patient denies any previous infection. ? ? ?PROBLEM LIST: ?Problem List  Date Reviewed: 04/16/2021  ? ?       Noted  ? Essential hypertension 04/16/2021  ? Benign prostatic hyperplasia (BPH) with straining on urination 12/16/2020  ? 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 Hyperlipemia, mixed (Trig 225, LDL 149 - 04/16/21) 12/16/2020  ? Tobacco use (1/2 pack per week) 12/16/2020  ? Seizure disorder (CMS-HCC) 08/06/2020  ? History of gastric cancer 07/01/2014  ? ? ?GENERAL REVIEW OF SYSTEMS:  ? ?General ROS: negative for - chills, fatigue, fever, weight gain or weight loss ?Allergy and Immunology ROS: negative for - hives  ?Hematological and Lymphatic ROS: negative for - bleeding problems or bruising, negative for palpable nodes ?Endocrine ROS: negative for - heat or cold intolerance, hair changes ?Respiratory ROS: negative for - cough, shortness of breath or wheezing ?Cardiovascular ROS: no chest pain or palpitations ?GI ROS: negative for nausea, vomiting, abdominal pain, diarrhea, constipation ?Musculoskeletal ROS: negative for - joint swelling or muscle pain ?Neurological ROS: negative for - confusion, syncope ?Dermatological ROS: negative for pruritus and rash ?Psychiatric: negative for anxiety, depression, difficulty sleeping and memory loss ? ?MEDICATIONS: ?Current Outpatient Medications  ?Medication Sig Dispense Refill  ? chlorthalidone 25 MG tablet  Take 1 tablet (25 mg total) by mouth once daily 30 tablet 3  ? levETIRAcetam (KEPPRA) 500 MG tablet Take 1 tablet (500 mg total) by mouth 2 (two) times daily 180 tablet 1  ? lovastatin (MEVACOR) 20 MG tablet Take 1 tablet (20 mg total) by mouth daily with dinner 30 tablet 3  ? tamsulosin (FLOMAX) 0.4 mg capsule Take 1 capsule (0.4 mg total) by mouth once daily 90 capsule 1  ? ?No current facility-administered medications for this visit.  ? ? ?ALLERGIES: ?Patient has no known allergies. ? ?PAST MEDICAL HISTORY: ?Past Medical History:  ?Diagnosis Date  ? Allergic state   ? Anxiety   ? Chronic gastritis 11/13/2014  ? Depression   ? Gastric ulcer 11/13/2014  ? History of stomach cancer   ? Kidney stones   ? Stomach cancer (CMS-HCC)   ? Tubular adenoma of colon 11/13/2014  ? ? ?PAST SURGICAL HISTORY: ?Past Surgical History:  ?Procedure Laterality Date  ? COLONOSCOPY  08/19/2011  ? Normal  ? EGD  08/19/2011  ? Stomach cancer/+ H. pylori  ? EGD  12/03/2013  ? PHx of gastric neoplasm/Normal exam/No Repeat/PYO  ? COLONOSCOPY  11/13/2014  ? Tubular adenoma/Repeat 5yrs/PYO  ? EGD  11/13/2014  ? Chronic gastritis/Gastric ulcer clean base/PHx of gastric cancer/No repeat/PYO  ? COLONOSCOPY  09/04/2020  ? Tubular adenomas/PHx CP/Repeat 5yrs/TKT  ? EGD  09/04/2020  ? Gastritis/+H. Pylori/PHx Gastric cancer/Repeat 3yrs/TKT  ? CYSTECTOMY    ? multiple, from neck and extremities  ? stomach cancer surgery    ?  ? ?FAMILY HISTORY: ?Family History  ?Problem Relation Age of Onset  ?   Breast cancer Mother   ? Myocardial Infarction (Heart attack) Father   ?  ? ?SOCIAL HISTORY: ?Social History  ? ?Socioeconomic History  ? Marital status: Single  ?Tobacco Use  ? Smoking status: Former  ?  Years: 38.00  ?  Types: Cigarettes  ?  Quit date: 09/18/2015  ?  Years since quitting: 5.5  ? Smokeless tobacco: Never  ? Tobacco comments:  ?  Smoked 2-3 cigarettes daily  ?Substance and Sexual Activity  ? Alcohol use: Yes  ?  Comment: Drinks, on average, 2-3  beers daily  ? Drug use: No  ?Social History Narrative  ? Marital Status- Single  ? Lives with friends  ? Employment- Disability   ? Exercise hx- Rides bicycles, roller-skates, walks  ? Religious Affiliation- [employer]  ? ? ?PHYSICAL EXAM: ?Vitals:  ? 04/22/21 1438  ?BP: 125/73  ?Pulse: 99  ? ?Body mass index is 26.73 kg/m?. ?Weight: 82.1 kg (181 lb)  ? ?GENERAL: Alert, active, oriented x3 ? ?HEENT: Pupils equal reactive to light. Extraocular movements are intact. Sclera clear. Palpebral conjunctiva normal red color.Pharynx clear. ? ?NECK: Supple with no palpable mass and no adenopathy. ? ?LUNGS: Sound clear with no rales rhonchi or wheezes. ? ?HEART: Regular rhythm S1 and S2 without murmur. ? ?ABDOMEN: Soft and depressible, nontender with no palpable mass, no hepatomegaly.  ? ?EXTREMITIES: Well-developed well-nourished symmetrical with no dependent edema.  Left posterior 4 cm cyst, round, mobile, soft, no skin changes. ? ?NEUROLOGICAL: Awake alert oriented, facial expression symmetrical, moving all extremities. ? ?REVIEW OF DATA: ?I have reviewed the following data today: ?Appointment on 04/16/2021  ?Component Date Value  ? Glucose 04/16/2021 93   ? Sodium 04/16/2021 141   ? Potassium 04/16/2021 4.2   ? Chloride 04/16/2021 106   ? Carbon Dioxide (CO2) 04/16/2021 29.6   ? Urea Nitrogen (BUN) 04/16/2021 16   ? Creatinine 04/16/2021 1.2   ? Glomerular Filtration Ra* 04/16/2021 63   ? Calcium 04/16/2021 9.5   ? AST  04/16/2021 21   ? ALT  04/16/2021 22   ? Alk Phos (alkaline Phosp* 04/16/2021 69   ? Albumin 04/16/2021 4.4   ? Bilirubin, Total 04/16/2021 0.8   ? Protein, Total 04/16/2021 6.5   ? A/G Ratio 04/16/2021 2.1   ? Cholesterol, Total 04/16/2021 252 (H)   ? Triglyceride 04/16/2021 225 (H)   ? HDL (High Density Lipopr* 04/16/2021 57.8   ? LDL Calculated 04/16/2021 149 (H)   ? VLDL Cholesterol 04/16/2021 45   ? Cholesterol/HDL Ratio 04/16/2021 4.4   ? Hemoglobin A1C 04/16/2021 5.9 (H)   ? Average Blood Glucose (C*  04/16/2021 123   ?  ? ?ASSESSMENT: ?[name] is a 56 y.o. male presenting for consultation for epidermal inclusion cyst. The patient has a mass on the left posterior thigh that is causing some pain and discomfort on pressure and rapidly growing. Patient oriented about the diagnosis of epidermal inclusion cyst, not a malignant lesion but if it is causing symptoms, excision can be considered. Patient oriented about the procedure, benefits and risk.  ? ?Patient offered to have the cyst removed in the office but he endorses that she would not tolerate the procedure if it is not done under anesthesia.  I think that this patient should have excision of the cyst under monitored anesthesia care. ? ?Epidermal inclusion cyst [L72.0] ? ?PLAN: ?1.  Excision of posterior left thigh mass.[id number] ?2.  Avoid taking any aspirin 5 days before the procedure ?3.    Contact us if you have any concern ? ?Patient verbalized understanding, all questions were answered, and were agreeable with the plan outlined above.  ? ?Frans Valente Cintron-Diaz, MD ? ?Electronically signed by Carmela Piechowski Cintron-Diaz, MD ? ?

## 2021-04-25 MED ORDER — ORAL CARE MOUTH RINSE: 15.0000 mL | Freq: Once | OROMUCOSAL | Status: AC

## 2021-04-25 MED ORDER — LACTATED RINGERS IV SOLN: INTRAVENOUS | Status: DC

## 2021-04-25 MED ORDER — CHLORHEXIDINE GLUCONATE 0.12 % MT SOLN: 15.0000 mL | Freq: Once | OROMUCOSAL | Status: AC

## 2021-04-25 MED ORDER — CEFAZOLIN SODIUM-DEXTROSE 2-4 GM/100ML-% IV SOLN
2.0000 g | INTRAVENOUS | Status: AC
  Administered 2021-04-26: 2 g via INTRAVENOUS

## 2021-04-26 ENCOUNTER — Encounter: Payer: Self-pay | Admitting: General Surgery

## 2021-04-26 ENCOUNTER — Other Ambulatory Visit: Payer: Self-pay

## 2021-04-26 ENCOUNTER — Encounter
Admission: RE | Disposition: A | Discharge status: Home / Self Care | Payer: Self-pay | Source: Home / Self Care | Attending: General Surgery

## 2021-04-26 ENCOUNTER — Ambulatory Visit
Admission: RE | Admit: 2021-04-26 | Discharge: 2021-04-26 | Disposition: A | Discharge status: Home / Self Care | Payer: Medicaid Other | Attending: General Surgery | Admitting: General Surgery

## 2021-04-26 ENCOUNTER — Ambulatory Visit: Payer: Medicaid Other | Admitting: Registered Nurse

## 2021-04-26 DIAGNOSIS — L720 Epidermal cyst: Secondary | ICD-10-CM | POA: Diagnosis not present

## 2021-04-26 DIAGNOSIS — Z87891 Personal history of nicotine dependence: Secondary | ICD-10-CM | POA: Insufficient documentation

## 2021-04-26 DIAGNOSIS — G40909 Epilepsy, unspecified, not intractable, without status epilepticus: Secondary | ICD-10-CM | POA: Insufficient documentation

## 2021-04-26 DIAGNOSIS — Z79899 Other long term (current) drug therapy: Secondary | ICD-10-CM | POA: Insufficient documentation

## 2021-04-26 DIAGNOSIS — I10 Essential (primary) hypertension: Secondary | ICD-10-CM | POA: Diagnosis not present

## 2021-04-26 DIAGNOSIS — Z85028 Personal history of other malignant neoplasm of stomach: Secondary | ICD-10-CM | POA: Diagnosis not present

## 2021-04-26 HISTORY — PX: CYST EXCISION: SHX5701

## 2021-04-26 SURGERY — CYST REMOVAL
Anesthesia: General | Site: Thigh | Laterality: Left | Wound class: Clean

## 2021-04-26 MED ORDER — 0.9 % SODIUM CHLORIDE (POUR BTL) OPTIME
TOPICAL | Status: DC | PRN
  Administered 2021-04-26: 500 mL

## 2021-04-26 MED ORDER — PROPOFOL 500 MG/50ML IV EMUL
INTRAVENOUS | Status: AC
  Filled 2021-04-26: qty 50

## 2021-04-26 MED ORDER — DEXMEDETOMIDINE HCL IN NACL 200 MCG/50ML IV SOLN
INTRAVENOUS | Status: AC
  Filled 2021-04-26: qty 50

## 2021-04-26 MED ORDER — PROPOFOL 500 MG/50ML IV EMUL
INTRAVENOUS | Status: DC | PRN
Start: 2021-04-26 — End: 2021-04-26
  Administered 2021-04-26: 100 ug/kg/min via INTRAVENOUS

## 2021-04-26 MED ORDER — DEXMEDETOMIDINE HCL IN NACL 200 MCG/50ML IV SOLN
INTRAVENOUS | Status: DC | PRN
  Administered 2021-04-26: 10 ug via INTRAVENOUS

## 2021-04-26 MED ORDER — FENTANYL CITRATE (PF) 100 MCG/2ML IJ SOLN
INTRAMUSCULAR | Status: AC
  Filled 2021-04-26: qty 2

## 2021-04-26 MED ORDER — CHLORHEXIDINE GLUCONATE 0.12 % MT SOLN
OROMUCOSAL | Status: AC
  Administered 2021-04-26: 15 mL via OROMUCOSAL
  Filled 2021-04-26: qty 15

## 2021-04-26 MED ORDER — FENTANYL CITRATE (PF) 100 MCG/2ML IJ SOLN
INTRAMUSCULAR | Status: DC | PRN
  Administered 2021-04-26 (×3): 25 ug via INTRAVENOUS

## 2021-04-26 MED ORDER — CEFAZOLIN SODIUM-DEXTROSE 2-4 GM/100ML-% IV SOLN
INTRAVENOUS | Status: AC
  Filled 2021-04-26: qty 100

## 2021-04-26 MED ORDER — PROPOFOL 10 MG/ML IV BOLUS
INTRAVENOUS | Status: DC | PRN
Start: 2021-04-26 — End: 2021-04-26
  Administered 2021-04-26: 30 mg via INTRAVENOUS

## 2021-04-26 MED ORDER — BUPIVACAINE-EPINEPHRINE 0.5% -1:200000 IJ SOLN
INTRAMUSCULAR | Status: DC | PRN
  Administered 2021-04-26: 20 mL

## 2021-04-26 MED ORDER — LIDOCAINE HCL (CARDIAC) PF 100 MG/5ML IV SOSY
PREFILLED_SYRINGE | INTRAVENOUS | Status: DC | PRN
  Administered 2021-04-26: 100 mg via INTRAVENOUS

## 2021-04-26 MED ORDER — LIDOCAINE HCL (PF) 2 % IJ SOLN
INTRAMUSCULAR | Status: AC
  Filled 2021-04-26: qty 5

## 2021-04-26 MED ORDER — MIDAZOLAM HCL 2 MG/2ML IJ SOLN
INTRAMUSCULAR | Status: AC
  Filled 2021-04-26: qty 2

## 2021-04-26 MED ORDER — MIDAZOLAM HCL 2 MG/2ML IJ SOLN
INTRAMUSCULAR | Status: DC | PRN
  Administered 2021-04-26: 2 mg via INTRAVENOUS

## 2021-04-26 MED ORDER — BUPIVACAINE-EPINEPHRINE (PF) 0.5% -1:200000 IJ SOLN
INTRAMUSCULAR | Status: AC
  Filled 2021-04-26: qty 30

## 2021-04-26 SURGICAL SUPPLY — 34 items
ADH SKN CLS APL DERMABOND .7 (GAUZE/BANDAGES/DRESSINGS) ×1
APL PRP STRL LF DISP 70% ISPRP (MISCELLANEOUS) ×1
BLADE SURG 15 STRL LF DISP TIS (BLADE) ×1 IMPLANT
BLADE SURG 15 STRL SS (BLADE) ×2
CHLORAPREP W/TINT 26 (MISCELLANEOUS) ×2 IMPLANT
CNTNR SPEC 2.5X3XGRAD LEK (MISCELLANEOUS) ×1
CONT SPEC 4OZ STER OR WHT (MISCELLANEOUS) ×1
CONT SPEC 4OZ STRL OR WHT (MISCELLANEOUS) ×1
CONTAINER SPEC 2.5X3XGRAD LEK (MISCELLANEOUS) ×1 IMPLANT
DERMABOND ADVANCED (GAUZE/BANDAGES/DRESSINGS) ×1
DERMABOND ADVANCED .7 DNX12 (GAUZE/BANDAGES/DRESSINGS) ×1 IMPLANT
DRAPE LAPAROTOMY 100X77 ABD (DRAPES) ×2 IMPLANT
DRAPE UNDER BUTTOCK W/FLU (DRAPES) IMPLANT
ELECT REM PT RETURN 9FT ADLT (ELECTROSURGICAL) ×2
ELECTRODE REM PT RTRN 9FT ADLT (ELECTROSURGICAL) ×1 IMPLANT
GAUZE 4X4 16PLY ~~LOC~~+RFID DBL (SPONGE) ×2 IMPLANT
GLOVE SURG ENC MOIS LTX SZ6.5 (GLOVE) ×2 IMPLANT
GLOVE SURG UNDER POLY LF SZ6.5 (GLOVE) ×2 IMPLANT
KIT TURNOVER KIT A (KITS) ×2 IMPLANT
LABEL OR SOLS (LABEL) ×2 IMPLANT
MANIFOLD NEPTUNE II (INSTRUMENTS) ×2 IMPLANT
MARGIN MAP 10MM (MISCELLANEOUS) ×2 IMPLANT
NDL HYPO 25X1 1.5 SAFETY (NEEDLE) ×1 IMPLANT
NEEDLE HYPO 25X1 1.5 SAFETY (NEEDLE) ×2 IMPLANT
NS IRRIG 500ML POUR BTL (IV SOLUTION) ×2 IMPLANT
PACK BASIN MINOR ARMC (MISCELLANEOUS) ×2 IMPLANT
SUT ETHILON 3-0 (SUTURE) ×2 IMPLANT
SUT MNCRL 4-0 (SUTURE) ×2
SUT MNCRL 4-0 27XMFL (SUTURE) ×1
SUT VIC AB 2-0 SH 27 (SUTURE) ×2
SUT VIC AB 2-0 SH 27XBRD (SUTURE) ×1 IMPLANT
SUTURE MNCRL 4-0 27XMF (SUTURE) ×1 IMPLANT
SYR 10ML LL (SYRINGE) ×2 IMPLANT
WATER STERILE IRR 500ML POUR (IV SOLUTION) ×2 IMPLANT

## 2021-04-26 NOTE — Discharge Instructions (Addendum)
?  Diet: Resume home heart healthy regular diet.  ? ?Activity: No activity restrictions ? ?Wound care: May shower with soapy water and pat dry (do not rub incisions), but no baths or submerging incision underwater until follow-up. (no swimming)  ? ?Medications: Resume all home medications. For mild to moderate pain: acetaminophen (Tylenol) or ibuprofen (if no kidney disease). Combining Tylenol with alcohol can substantially increase your risk of causing liver disease.  ? ?Call office [phone]) at any time if any questions, worsening pain, fevers/chills, bleeding, drainage from incision site, or other concerns. ?AMBULATORY SURGERY  ?DISCHARGE INSTRUCTIONS ? ? ?The drugs that you were given will stay in your system until tomorrow so for the next 24 hours you should not: ? ?Drive an automobile ?Make any legal decisions ?Drink any alcoholic beverage ? ? ?You may resume regular meals tomorrow.  Today it is better to start with liquids and gradually work up to solid foods. ? ?You may eat anything you prefer, but it is better to start with liquids, then soup and crackers, and gradually work up to solid foods. ? ? ?Please notify your doctor immediately if you have any unusual bleeding, trouble breathing, redness and pain at the surgery site, drainage, fever, or pain not relieved by medication. ? ? ? ?Additional Instructions: ? ? ? ? ? ? ? ?Please contact your physician with any problems or Same Day Surgery at [phone], Monday through Friday 6 am to 4 pm, or [place] at [facility] number at [phone].  ?

## 2021-04-26 NOTE — Interval H&P Note (Signed)
History and Physical Interval Note: ? ?04/26/2021 ?10:12 AM ? ?Andrew Peters  has presented today for surgery, with the diagnosis of L72.0 epidermal inclusion cyst.  The various methods of treatment have been discussed with the patient and family. After consideration of risks, benefits and other options for treatment, the patient has consented to  Procedure(s): ?CYST REMOVAL (Left) thigh as a surgical intervention.  The patient's history has been reviewed, patient examined, no change in status, stable for surgery.  I have reviewed the patient's chart and labs.  Left thigh marked in the pre procedure room. Questions were answered to the patient's satisfaction.   ? ? ?Yarixa Lightcap Cintron-Diaz ? ? ?

## 2021-04-26 NOTE — Op Note (Signed)
OPERATION REPORT ? ?Pre Operative Diagnosis: Left thigh epidermal inclusion cyst ? ?Post operative diagnosis: Left thigh epidermal inclusion cyst ? ?Procedure: Excision of left thigh.  Medication cyst ? ?Anesthesia: MAC and Local ?  ?Surgeon: Dr. Windell Moment ?  ?Findings: ?-The 4.5 cm, round soft tissue mass removed ? ?Indication: This 56 y.o. year old male with a soft tissue mass that is causing pain and growing in size ?  ?Description of procedure: after orienting patient about the procedure steps and benefits and patient agreed to proceed. Time out was done identifying correct patient and location of procedure. After induction of monitored sedation, local anesthesia was infiltrated around the palpable lesion. With a blade #15, an elliptical incision was made using the skin lines. Sharp dissection was carried down and lesion was excised including dermal tissue. The mass measured 4.5 cm. Deep dermal stitches were done with vicryl 4-0 to repair the laceration and skin closed with Monocryl 4-0 in subcuticular fashion. Specimen sent to pathology.  ?  ?Complications: none ?  ?EBL: minimal ? ?Herbert Pun, MD, FACS ? ?

## 2021-04-26 NOTE — Transfer of Care (Signed)
Immediate Anesthesia Transfer of Care Note ? ?Patient: Finch Costanzo ? ?Procedure(s) Performed: CYST REMOVAL (Left: Thigh) ? ?Patient Location: PACU ? ?Anesthesia Type:General ? ?Level of Consciousness: drowsy ? ?Airway & Oxygen Therapy: Patient Spontanous Breathing and Patient connected to nasal cannula oxygen ? ?Post-op Assessment: Report given to RN and Post -op Vital signs reviewed and stable ? ?Post vital signs: Reviewed and stable ? ?Last Vitals:  ?Vitals Value Taken Time  ?BP    ?Temp    ?Pulse 82 04/26/21 1108  ?Resp 13 04/26/21 1108  ?SpO2 95 % 04/26/21 1108  ?Vitals shown include unvalidated device data. ? ?Last Pain:  ?Vitals:  ? 04/26/21 0922  ?TempSrc: Temporal  ?PainSc: 0-No pain  ?   ? ?  ? ?Complications: No notable events documented. ?

## 2021-04-26 NOTE — Anesthesia Preprocedure Evaluation (Addendum)
Anesthesia Evaluation  ?Patient identified by MRN, date of birth, ID band ?Patient awake ? ? ? ?Reviewed: ?Allergy & Precautions, NPO status , Patient's Chart, lab work & pertinent test results ? ?History of Anesthesia Complications ?Negative for: history of anesthetic complications ? ?Airway ?Mallampati: III ? ? ? ? ? ? Dental ? ?(+) Missing,  ?  ?Pulmonary ?Current Smoker and Patient abstained from smoking.,  ?  ?Pulmonary exam normal ? ? ? ? ? ? ? Cardiovascular ?negative cardio ROS ?Normal cardiovascular exam ? ? ?  ?Neuro/Psych ?Seizures - (Last seizure 2021), Well Controlled,    ? GI/Hepatic ?negative GI ROS, H/o Gastrectomy ?  ?Endo/Other  ?negative endocrine ROS ? Renal/GU ?negative Renal ROS  ? ?  ?Musculoskeletal ?negative musculoskeletal ROS ?(+)  ? Abdominal ?Normal abdominal exam  (+)   ?Peds ? Hematology ?negative hematology ROS ?(+)   ?Anesthesia Other Findings ?CYST left thigh ? Reproductive/Obstetrics ? ?  ? ? ? ? ? ? ? ? ? ? ? ? ? ?  ?  ? ? ? ? ? ? ? ?Anesthesia Physical ? ?Anesthesia Plan ? ?ASA: II ? ?Anesthesia Plan: General  ? ?Post-op Pain Management: Minimal or no pain anticipated and Regional block*  ? ?Induction: Intravenous ? ?PONV Risk Score and Plan: TIVA ? ?Airway Management Planned: Simple Face Mask and Natural Airway ? ?Additional Equipment:  ? ?Intra-op Plan:  ? ?Post-operative Plan:  ? ?Informed Consent: I have reviewed the patients History and Physical, chart, labs and discussed the procedure including the risks, benefits and alternatives for the proposed anesthesia with the patient or authorized representative who has indicated his/her understanding and acceptance.  ? ? ? ?Dental advisory given ? ?Plan Discussed with: CRNA and Anesthesiologist ? ?Anesthesia Plan Comments:   ? ? ? ? ?Anesthesia Quick Evaluation ? ?

## 2021-04-27 LAB — SURGICAL PATHOLOGY

## 2021-04-27 NOTE — Anesthesia Postprocedure Evaluation (Signed)
Anesthesia Post Note ? ?Patient: Derwood Becraft ? ?Procedure(s) Performed: CYST REMOVAL (Left: Thigh) ? ?Patient location during evaluation: PACU ?Anesthesia Type: General ?Level of consciousness: awake and alert ?Pain management: pain level controlled ?Vital Signs Assessment: post-procedure vital signs reviewed and stable ?Respiratory status: spontaneous breathing, nonlabored ventilation and respiratory function stable ?Cardiovascular status: blood pressure returned to baseline and stable ?Postop Assessment: no apparent nausea or vomiting ?Anesthetic complications: no ? ? ?No notable events documented. ? ? ?Last Vitals:  ?Vitals:  ? 04/26/21 1145 04/26/21 1158  ?BP: 105/73 119/81  ?Pulse: 61 78  ?Resp: 18 16  ?Temp:  (!) 36.2 ?C  ?SpO2: 99% 100%  ?  ?Last Pain:  ?Vitals:  ? 04/27/21 1203  ?TempSrc:   ?PainSc: 0-No pain  ? ? ?  ?  ?  ?  ?  ?  ? ?Iran Ouch ? ? ? ? ?

## 2021-05-05 ENCOUNTER — Other Ambulatory Visit: Payer: Self-pay | Admitting: *Deleted

## 2021-05-05 DIAGNOSIS — C168 Malignant neoplasm of overlapping sites of stomach: Secondary | ICD-10-CM

## 2021-05-05 DIAGNOSIS — D509 Iron deficiency anemia, unspecified: Secondary | ICD-10-CM

## 2021-05-11 ENCOUNTER — Inpatient Hospital Stay: Payer: Medicaid Other | Attending: Oncology

## 2021-05-11 DIAGNOSIS — E538 Deficiency of other specified B group vitamins: Secondary | ICD-10-CM

## 2021-05-11 DIAGNOSIS — C168 Malignant neoplasm of overlapping sites of stomach: Secondary | ICD-10-CM

## 2021-05-11 DIAGNOSIS — D509 Iron deficiency anemia, unspecified: Secondary | ICD-10-CM

## 2021-05-11 DIAGNOSIS — C169 Malignant neoplasm of stomach, unspecified: Secondary | ICD-10-CM | POA: Insufficient documentation

## 2021-05-11 LAB — CBC WITH DIFFERENTIAL/PLATELET
Abs Immature Granulocytes: 0.02 10*3/uL (ref 0.00–0.07)
Basophils Absolute: 0 10*3/uL (ref 0.0–0.1)
Basophils Relative: 1 %
Eosinophils Absolute: 0 10*3/uL (ref 0.0–0.5)
Eosinophils Relative: 0 %
HCT: 44.3 % (ref 39.0–52.0)
Hemoglobin: 15.4 g/dL (ref 13.0–17.0)
Immature Granulocytes: 0 %
Lymphocytes Relative: 41 %
Lymphs Abs: 2.2 10*3/uL (ref 0.7–4.0)
MCH: 31.9 pg (ref 26.0–34.0)
MCHC: 34.8 g/dL (ref 30.0–36.0)
MCV: 91.7 fL (ref 80.0–100.0)
Monocytes Absolute: 0.4 10*3/uL (ref 0.1–1.0)
Monocytes Relative: 8 %
Neutro Abs: 2.7 10*3/uL (ref 1.7–7.7)
Neutrophils Relative %: 50 %
Platelets: 206 10*3/uL (ref 150–400)
RBC: 4.83 MIL/uL (ref 4.22–5.81)
RDW: 13 % (ref 11.5–15.5)
WBC: 5.4 10*3/uL (ref 4.0–10.5)
nRBC: 0 % (ref 0.0–0.2)

## 2021-05-11 LAB — IRON AND TIBC
Iron: 110 ug/dL (ref 45–182)
Saturation Ratios: 27 % (ref 17.9–39.5)
TIBC: 409 ug/dL (ref 250–450)
UIBC: 299 ug/dL

## 2021-05-11 LAB — COMPREHENSIVE METABOLIC PANEL
ALT: 22 U/L (ref 0–44)
AST: 28 U/L (ref 15–41)
Albumin: 4.4 g/dL (ref 3.5–5.0)
Alkaline Phosphatase: 58 U/L (ref 38–126)
Anion gap: 7 (ref 5–15)
BUN: 24 mg/dL — ABNORMAL HIGH (ref 6–20)
CO2: 27 mmol/L (ref 22–32)
Calcium: 9.1 mg/dL (ref 8.9–10.3)
Chloride: 102 mmol/L (ref 98–111)
Creatinine, Ser: 1.29 mg/dL — ABNORMAL HIGH (ref 0.61–1.24)
GFR, Estimated: >60 mL/min (ref >60)
Glucose, Bld: 146 mg/dL — ABNORMAL HIGH (ref 70–99)
Potassium: 3.2 mmol/L — ABNORMAL LOW (ref 3.5–5.1)
Sodium: 136 mmol/L (ref 135–145)
Total Bilirubin: 0.9 mg/dL (ref 0.3–1.2)
Total Protein: 7 g/dL (ref 6.5–8.1)

## 2021-05-11 LAB — VITAMIN B12: Vitamin B-12: 448 pg/mL (ref 180–914)

## 2021-05-11 LAB — FOLATE: Folate: 16.7 ng/mL (ref >5.9)

## 2021-05-11 LAB — FERRITIN: Ferritin: 53 ng/mL (ref 24–336)

## 2021-05-13 ENCOUNTER — Inpatient Hospital Stay: Payer: Medicaid Other | Admitting: Oncology

## 2021-06-23 ENCOUNTER — Ambulatory Visit (INDEPENDENT_AMBULATORY_CARE_PROVIDER_SITE_OTHER): Payer: Medicaid Other | Admitting: Urology

## 2021-06-23 ENCOUNTER — Encounter: Payer: Self-pay | Admitting: Urology

## 2021-06-23 VITALS — BP 118/77 | HR 74 | Ht 70.0 in | Wt 174.0 lb

## 2021-06-23 DIAGNOSIS — R3911 Hesitancy of micturition: Secondary | ICD-10-CM

## 2021-06-23 DIAGNOSIS — N401 Enlarged prostate with lower urinary tract symptoms: Secondary | ICD-10-CM | POA: Diagnosis not present

## 2021-06-23 DIAGNOSIS — R3916 Straining to void: Secondary | ICD-10-CM | POA: Diagnosis not present

## 2021-06-23 LAB — BLADDER SCAN AMB NON-IMAGING: Scan Result: 70

## 2021-06-23 NOTE — Progress Notes (Signed)
? ?06/23/2021 ?11:05 AM  ? ?[facility] ?[birth year] ?[id number] ? ?Referring provider: Dion Body, MD ?[place] ?[facility] ?[place],  [place] [address] ? ?Chief Complaint  ?Patient presents with  ? Benign Prostatic Hypertrophy  ? ? ?Urologic history: ?1.  BPH with LUTS ?Tamsulosin ? ? ?HPI: ?56 y.o. male presents for annual follow-up. ? ?Initially seen 05/13/2020 with urinary hesitancy and straining to urinate ?Benign DRE ?Started tamsulosin with significant improvement in his voiding pattern ?Recently noted worsening voiding symptoms and tamsulosin was titrated to 0.8 mg.  Due to increase in the dose he ran out of tamsulosin with significant worsening of his voiding pattern ?PSA 12/2020 1.13 ? ? ?PMH: ?Past Medical History:  ?Diagnosis Date  ? Cancer [facility]) 2012  ? Gastrectomy  ? ? ?Surgical History: ?Past Surgical History:  ?Procedure Laterality Date  ? COLONOSCOPY WITH PROPOFOL N/A 11/13/2014  ? Procedure: COLONOSCOPY WITH PROPOFOL;  Surgeon: Hulen Luster, MD;  Location: [facility] ENDOSCOPY;  Service: Gastroenterology;  Laterality: N/A;  ? CYST EXCISION Left 04/26/2021  ? Procedure: CYST REMOVAL;  Surgeon: Herbert Pun, MD;  Location: [facility] ORS;  Service: General;  Laterality: Left;  ? ESOPHAGOGASTRODUODENOSCOPY (EGD) WITH PROPOFOL N/A 11/13/2014  ? Procedure: ESOPHAGOGASTRODUODENOSCOPY (EGD) WITH PROPOFOL;  Surgeon: Hulen Luster, MD;  Location: [facility] ENDOSCOPY;  Service: Gastroenterology;  Laterality: N/A;  ? STOMACH SURGERY    ? 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  ? ? ?Home Medications:  ?Allergies as of 06/23/2021   ?No Known Allergies ?  ? ?  ?Medication List  ?  ? ?  ? Accurate as of Jun 23, 2021 11:05 AM. If you have any questions, ask your nurse or doctor.  ?  ?  ? ?  ? ?benzonatate 100 MG capsule ?Commonly known as: Best boy ?Take 1-2 tabs TID prn cough ?  ?brompheniramine-pseudoephedrine-DM 30-2-10 MG/5ML syrup ?Take 5 mLs by mouth 4 (four) times daily as  needed. ?  ?ibuprofen 600 MG tablet ?Commonly known as: ADVIL ?Take 1 tablet (600 mg total) by mouth every 8 (eight) hours as needed. ?  ?levETIRAcetam 500 MG tablet ?Commonly known as: Keppra ?Take 1 tablet (500 mg total) by mouth 2 (two) times daily. ?  ?ondansetron 4 MG disintegrating tablet ?Commonly known as: Zofran ODT ?Take 1 tablet (4 mg total) by mouth every 8 (eight) hours as needed. ?  ?tamsulosin 0.4 MG Caps capsule ?Commonly known as: FLOMAX ?Take 1 capsule (0.4 mg total) by mouth daily. ?  ? ?  ? ? ?Allergies: No Known Allergies ? ?Family History: ?No family history on file. ? ?Social History:  reports that he has been smoking cigarettes. He has been smoking an average of .25 packs per day. He has never used smokeless tobacco. He reports current alcohol use of about 42.0 standard drinks per week. He reports that he does not use drugs. ? ? ?Physical Exam: ?BP 118/77   Pulse 74   Ht '5\' 10"'$  (1.778 m)   Wt 174 lb (78.9 kg)   BMI 24.97 kg/m?   ?Constitutional:  Alert and oriented, No acute distress. ?HEENT: [place] AT, moist mucus membranes.  Trachea midline, no masses. ?Cardiovascular: No clubbing, cyanosis, or edema. ?Respiratory: Normal respiratory effort, no increased work of breathing. ?Neurologic: Grossly intact, no focal deficits, moving all 4 extremities. ?Psychiatric: Normal mood and affect. ? ? ?Assessment & Plan:   ? ?1. Benign prostatic hyperplasia with LUTS  ?Tamsulosin refilled and saw additional symptom improvement  on 0.8 mg ?If Dr. Netty Starring will refill annually we will see him back as needed ?PVR today off medication was 70 mL ? ? ?Abbie Sons, MD ? ?[place] ?[address] ?[place], [place] [address] ?[phone] ? ?

## 2021-08-19 ENCOUNTER — Encounter: Payer: Self-pay | Admitting: Emergency Medicine

## 2021-08-19 ENCOUNTER — Emergency Department: Payer: Medicaid Other

## 2021-08-19 ENCOUNTER — Other Ambulatory Visit: Payer: Self-pay

## 2021-08-19 ENCOUNTER — Emergency Department
Admission: EM | Admit: 2021-08-19 | Discharge: 2021-08-19 | Disposition: A | Discharge status: Home / Self Care | Payer: Medicaid Other | Attending: Emergency Medicine | Admitting: Emergency Medicine

## 2021-08-19 DIAGNOSIS — R197 Diarrhea, unspecified: Secondary | ICD-10-CM | POA: Diagnosis not present

## 2021-08-19 DIAGNOSIS — E860 Dehydration: Secondary | ICD-10-CM

## 2021-08-19 DIAGNOSIS — R1111 Vomiting without nausea: Secondary | ICD-10-CM | POA: Diagnosis not present

## 2021-08-19 DIAGNOSIS — I10 Essential (primary) hypertension: Secondary | ICD-10-CM | POA: Diagnosis not present

## 2021-08-19 DIAGNOSIS — R112 Nausea with vomiting, unspecified: Secondary | ICD-10-CM | POA: Diagnosis not present

## 2021-08-19 DIAGNOSIS — K920 Hematemesis: Secondary | ICD-10-CM | POA: Diagnosis not present

## 2021-08-19 LAB — LIPASE, BLOOD: Lipase: 54 U/L — ABNORMAL HIGH (ref 11–51)

## 2021-08-19 LAB — COMPREHENSIVE METABOLIC PANEL
ALT: 26 U/L (ref 0–44)
AST: 32 U/L (ref 15–41)
Albumin: 4.7 g/dL (ref 3.5–5.0)
Alkaline Phosphatase: 58 U/L (ref 38–126)
Anion gap: 9 (ref 5–15)
BUN: 21 mg/dL — ABNORMAL HIGH (ref 6–20)
CO2: 27 mmol/L (ref 22–32)
Calcium: 9.4 mg/dL (ref 8.9–10.3)
Chloride: 102 mmol/L (ref 98–111)
Creatinine, Ser: 1.3 mg/dL — ABNORMAL HIGH (ref 0.61–1.24)
GFR, Estimated: >60 mL/min (ref >60)
Glucose, Bld: 129 mg/dL — ABNORMAL HIGH (ref 70–99)
Potassium: 3.1 mmol/L — ABNORMAL LOW (ref 3.5–5.1)
Sodium: 138 mmol/L (ref 135–145)
Total Bilirubin: 1.3 mg/dL — ABNORMAL HIGH (ref 0.3–1.2)
Total Protein: 7.1 g/dL (ref 6.5–8.1)

## 2021-08-19 LAB — CBC
HCT: 44 % (ref 39.0–52.0)
Hemoglobin: 15.6 g/dL (ref 13.0–17.0)
MCH: 31.8 pg (ref 26.0–34.0)
MCHC: 35.5 g/dL (ref 30.0–36.0)
MCV: 89.6 fL (ref 80.0–100.0)
Platelets: 214 10*3/uL (ref 150–400)
RBC: 4.91 MIL/uL (ref 4.22–5.81)
RDW: 12.3 % (ref 11.5–15.5)
WBC: 5.1 10*3/uL (ref 4.0–10.5)
nRBC: 0 % (ref 0.0–0.2)

## 2021-08-19 MED ORDER — PANTOPRAZOLE SODIUM 40 MG PO TBEC: 40.0000 mg | DELAYED_RELEASE_TABLET | Freq: Every day | ORAL | 0 refills | Status: DC

## 2021-08-19 MED ORDER — ONDANSETRON HCL 4 MG/2ML IJ SOLN
4.0000 mg | Freq: Once | INTRAMUSCULAR | Status: AC
  Administered 2021-08-19: 4 mg via INTRAVENOUS
  Filled 2021-08-19: qty 2

## 2021-08-19 MED ORDER — ONDANSETRON 4 MG PO TBDP: 4.0000 mg | ORAL_TABLET | Freq: Three times a day (TID) | ORAL | 0 refills | Status: DC | PRN

## 2021-08-19 MED ORDER — SODIUM CHLORIDE 0.9 % IV BOLUS
1000.0000 mL | Freq: Once | INTRAVENOUS | Status: AC
  Administered 2021-08-19: 1000 mL via INTRAVENOUS

## 2021-08-19 MED ORDER — MORPHINE SULFATE (PF) 4 MG/ML IV SOLN
4.0000 mg | Freq: Once | INTRAVENOUS | Status: AC
Start: 2021-08-19 — End: 2021-08-19
  Administered 2021-08-19: 4 mg via INTRAVENOUS
  Filled 2021-08-19: qty 1

## 2021-08-19 MED ORDER — IOHEXOL 300 MG/ML  SOLN
100.0000 mL | Freq: Once | INTRAMUSCULAR | Status: AC | PRN
  Administered 2021-08-19: 100 mL via INTRAVENOUS

## 2021-08-19 MED ORDER — PROMETHAZINE HCL 25 MG RE SUPP: 25.0000 mg | Freq: Four times a day (QID) | RECTAL | 0 refills | Status: DC | PRN

## 2021-08-19 NOTE — ED Triage Notes (Addendum)
Pt via EMS from home. Pt c/o L sided abd pain, hematemesis, and diarrhea since this AM. States that has been to the bathroom 4 times since this AM. Denies any fevers. States he had a hx of stomach cancer and states that he had half of his stomach removed 10 years ago. Denies any other abd hx. Pt is A&OX4 and NAD

## 2021-08-19 NOTE — ED Notes (Signed)
Pt c/o N/V/D since this morning with mild intermittent abd cramping. Pt is in NAD at present. Denies any other issue with his bowel previous to today.

## 2021-08-19 NOTE — ED Provider Notes (Signed)
[facility] Provider Note    Event Date/Time   First MD Initiated Contact with Patient 08/19/21 1319     (approximate)   History   Hematemesis and Diarrhea   HPI  Andrew Peters is a 56 y.o. male here with nausea, vomiting, diarrhea.  The patient states that he woke up this morning with severe nausea, vomiting.  He vomited several episodes of yellowish emesis, then had some blood streaks in it.  He had a loose bowel movement as well.  Had diffuse abdominal cramping with this.  Patient reports he has a history of stomach cancer and partially removed 10 years ago, has been stable since then.  Denies any history of ulcers.  He does not have chronic GI issues related to this.  No sick contacts.  No recent travel.     Physical Exam   Triage Vital Signs: ED Triage Vitals  Enc Vitals Group     BP 08/19/21 1311 (!) 122/108     Pulse Rate 08/19/21 1311 89     Resp 08/19/21 1311 16     Temp 08/19/21 1311 98.2 F (36.8 C)     Temp Source 08/19/21 1311 Oral     SpO2 08/19/21 1311 95 %     Weight 08/19/21 1309 185 lb (83.9 kg)     Height 08/19/21 1309 '5\' 7"'$  (1.702 m)     Head Circumference --      Peak Flow --      Pain Score 08/19/21 1309 6     Pain Loc --      Pain Edu? --      Excl. in [place]? --     Most recent vital signs: Vitals:   08/19/21 1520 08/19/21 1638  BP: 118/89 124/72  Pulse: 80 74  Resp: 16 15  Temp:  98.2 F (36.8 C)  SpO2: 97% 98%     General: Awake, no distress.  CV:  Good peripheral perfusion.  Regular rhythm. Resp:  Normal effort.  Normal work of breathing Abd:  No distention.  Mild epigastric tenderness. Other:  Mildly dry mucous membranes.   ED Results / Procedures / Treatments   Labs (all labs ordered are listed, but only abnormal results are displayed) Labs Reviewed  LIPASE, BLOOD - Abnormal; Notable for the following components:      Result Value   Lipase 54 (*)    All other components within normal limits   COMPREHENSIVE METABOLIC PANEL - Abnormal; Notable for the following components:   Potassium 3.1 (*)    Glucose, Bld 129 (*)    BUN 21 (*)    Creatinine, Ser 1.30 (*)    Total Bilirubin 1.3 (*)    All other components within normal limits  CBC  URINALYSIS, ROUTINE W REFLEX MICROSCOPIC     EKG    RADIOLOGY CT A/P: No acute intra-abdominal process   I also independently reviewed and agree with radiologist interpretations.   PROCEDURES:  Critical Care performed: Yes, see critical care procedure note(s)  Procedures    MEDICATIONS ORDERED IN ED: Medications  sodium chloride 0.9 % bolus 1,000 mL (0 mLs Intravenous Stopped 08/19/21 1637)  ondansetron (ZOFRAN) injection 4 mg (4 mg Intravenous Given 08/19/21 1426)  morphine (PF) 4 MG/ML injection 4 mg (4 mg Intravenous Given 08/19/21 1426)  iohexol (OMNIPAQUE) 300 MG/ML solution 100 mL (100 mLs Intravenous Contrast Given 08/19/21 1453)     IMPRESSION / MDM / ASSESSMENT AND PLAN / ED COURSE  I  reviewed the triage vital signs and the nursing notes.                               The patient is on the cardiac monitor to evaluate for evidence of arrhythmia and/or significant heart rate changes.   Ddx:  Differential includes the following, with pertinent life- or limb-threatening emergencies considered:  Viral GI illness, foodborne illness, gastroenteritis, cholecystitis, biliary colic, peptic ulcer disease, pancreatitis, obstruction, volvulus  Patient's presentation is most consistent with acute illness / injury with system symptoms.  MDM:  56 yo M with remote h/o gastric CA here with n/v, blood-streaked emesis. Suspect possible viral GI vs food borne illness vs gastritis, with mucosal irritation/small MW tear. Pt is afebrile, well appearing, not tachycardic or tachypneic, no signs to suggest esophageal injury. Pt is HDS, no melena, no ongoing hematemesis, no h/o PUD and this is less likely clinically. CMP unremarkable, Cr at  baseline. LFTs and lipase unremarkable. CBC without leukocytosis and Hgb is normal at 15.6. CT A/P obtained, reviewed, and shows no acute intra-abd pathology.Pt feels much better after IVF, antiemetics.  Will tx for gastritis/viral GI illness vs food borne illness, with addition of antacids given hx. Return precautions given.  MEDICATIONS GIVEN IN ED: Medications  sodium chloride 0.9 % bolus 1,000 mL (0 mLs Intravenous Stopped 08/19/21 1637)  ondansetron (ZOFRAN) injection 4 mg (4 mg Intravenous Given 08/19/21 1426)  morphine (PF) 4 MG/ML injection 4 mg (4 mg Intravenous Given 08/19/21 1426)  iohexol (OMNIPAQUE) 300 MG/ML solution 100 mL (100 mLs Intravenous Contrast Given 08/19/21 1453)     Consults:  None   EMR reviewed  Prior Oncology notes, notes from Dr. Peyton Najjar     FINAL CLINICAL IMPRESSION(S) / ED DIAGNOSES   Final diagnoses:  Dehydration  Nausea vomiting and diarrhea     Rx / DC Orders   ED Discharge Orders          Ordered    ondansetron (ZOFRAN-ODT) 4 MG disintegrating tablet  Every 8 hours PRN        08/19/21 1619    promethazine (PHENERGAN) 25 MG suppository  Every 6 hours PRN        08/19/21 1619    pantoprazole (PROTONIX) 40 MG tablet  Daily        08/19/21 1619             Note:  This document was prepared using Dragon voice recognition software and may include unintentional dictation errors.   Duffy Bruce, MD 08/19/21 [id number]

## 2021-08-19 NOTE — ED Notes (Signed)
First Nurse Note: Pt to ED via [facility] from home for N/V/D that yesterday today. Pt reports that he  Vomited x 3, Pt states that he had blood in his vomit the last time. Vitals with EMS as follows:  BP: 148/89 HR: 90 SpO2: 98%  RR 16

## 2022-01-27 ENCOUNTER — Encounter: Payer: Self-pay | Admitting: Emergency Medicine

## 2022-01-27 ENCOUNTER — Other Ambulatory Visit: Payer: Self-pay

## 2022-01-27 ENCOUNTER — Emergency Department
Admission: EM | Admit: 2022-01-27 | Discharge: 2022-01-28 | Disposition: A | Discharge status: Home / Self Care | Payer: Medicaid Other | Attending: Emergency Medicine | Admitting: Emergency Medicine

## 2022-01-27 DIAGNOSIS — Z85028 Personal history of other malignant neoplasm of stomach: Secondary | ICD-10-CM | POA: Diagnosis not present

## 2022-01-27 DIAGNOSIS — Z1152 Encounter for screening for COVID-19: Secondary | ICD-10-CM | POA: Insufficient documentation

## 2022-01-27 DIAGNOSIS — R45851 Suicidal ideations: Secondary | ICD-10-CM

## 2022-01-27 DIAGNOSIS — F329 Major depressive disorder, single episode, unspecified: Secondary | ICD-10-CM | POA: Insufficient documentation

## 2022-01-27 DIAGNOSIS — F209 Schizophrenia, unspecified: Secondary | ICD-10-CM | POA: Insufficient documentation

## 2022-01-27 DIAGNOSIS — R443 Hallucinations, unspecified: Secondary | ICD-10-CM | POA: Diagnosis not present

## 2022-01-27 DIAGNOSIS — F32A Depression, unspecified: Secondary | ICD-10-CM | POA: Diagnosis present

## 2022-01-27 HISTORY — DX: Paranoid schizophrenia: F20.0

## 2022-01-27 LAB — URINE DRUG SCREEN, QUALITATIVE (ARMC ONLY)
Amphetamines, Ur Screen: NOT DETECTED
Barbiturates, Ur Screen: NOT DETECTED
Benzodiazepine, Ur Scrn: NOT DETECTED
Cannabinoid 50 Ng, Ur ~~LOC~~: NOT DETECTED
Cocaine Metabolite,Ur ~~LOC~~: NOT DETECTED
MDMA (Ecstasy)Ur Screen: NOT DETECTED
Methadone Scn, Ur: NOT DETECTED
Opiate, Ur Screen: NOT DETECTED
Phencyclidine (PCP) Ur S: NOT DETECTED
Tricyclic, Ur Screen: NOT DETECTED

## 2022-01-27 LAB — COMPREHENSIVE METABOLIC PANEL
ALT: 23 U/L (ref 0–44)
AST: 21 U/L (ref 15–41)
Albumin: 4.7 g/dL (ref 3.5–5.0)
Alkaline Phosphatase: 53 U/L (ref 38–126)
Anion gap: 10 (ref 5–15)
BUN: 29 mg/dL — ABNORMAL HIGH (ref 6–20)
CO2: 24 mmol/L (ref 22–32)
Calcium: 9.5 mg/dL (ref 8.9–10.3)
Chloride: 106 mmol/L (ref 98–111)
Creatinine, Ser: 1.44 mg/dL — ABNORMAL HIGH (ref 0.61–1.24)
GFR, Estimated: 58 mL/min — ABNORMAL LOW (ref >60)
Glucose, Bld: 95 mg/dL (ref 70–99)
Potassium: 3.7 mmol/L (ref 3.5–5.1)
Sodium: 140 mmol/L (ref 135–145)
Total Bilirubin: 1.2 mg/dL (ref 0.3–1.2)
Total Protein: 7.3 g/dL (ref 6.5–8.1)

## 2022-01-27 LAB — CBC
HCT: 49.2 % (ref 39.0–52.0)
Hemoglobin: 17.1 g/dL — ABNORMAL HIGH (ref 13.0–17.0)
MCH: 31.5 pg (ref 26.0–34.0)
MCHC: 34.8 g/dL (ref 30.0–36.0)
MCV: 90.8 fL (ref 80.0–100.0)
Platelets: 292 10*3/uL (ref 150–400)
RBC: 5.42 MIL/uL (ref 4.22–5.81)
RDW: 12.4 % (ref 11.5–15.5)
WBC: 5.5 10*3/uL (ref 4.0–10.5)
nRBC: 0 % (ref 0.0–0.2)

## 2022-01-27 LAB — RESP PANEL BY RT-PCR (RSV, FLU A&B, COVID)  RVPGX2
Influenza A by PCR: NEGATIVE
Influenza B by PCR: NEGATIVE
Resp Syncytial Virus by PCR: NEGATIVE
SARS Coronavirus 2 by RT PCR: NEGATIVE

## 2022-01-27 LAB — ACETAMINOPHEN LEVEL: Acetaminophen (Tylenol), Serum: <10 ug/mL — ABNORMAL LOW (ref 10–30)

## 2022-01-27 LAB — SALICYLATE LEVEL: Salicylate Lvl: <7 mg/dL — ABNORMAL LOW (ref 7.0–30.0)

## 2022-01-27 LAB — ETHANOL: Alcohol, Ethyl (B): <10 mg/dL (ref <10)

## 2022-01-27 MED ORDER — HYDROXYZINE HCL 25 MG PO TABS: 25.0000 mg | ORAL_TABLET | Freq: Four times a day (QID) | ORAL | Status: DC | PRN

## 2022-01-27 MED ORDER — LORAZEPAM 1 MG PO TABS: 1.0000 mg | ORAL_TABLET | Freq: Four times a day (QID) | ORAL | Status: DC | PRN

## 2022-01-27 MED ORDER — THIAMINE MONONITRATE 100 MG PO TABS
100.0000 mg | ORAL_TABLET | Freq: Every day | ORAL | Status: DC
  Administered 2022-01-28: 100 mg via ORAL
  Filled 2022-01-27: qty 1

## 2022-01-27 MED ORDER — THIAMINE MONONITRATE 100 MG PO TABS
100.0000 mg | ORAL_TABLET | Freq: Once | ORAL | Status: AC
  Administered 2022-01-27: 100 mg via ORAL

## 2022-01-27 MED ORDER — ADULT MULTIVITAMIN W/MINERALS CH
1.0000 | ORAL_TABLET | Freq: Every day | ORAL | Status: DC
  Administered 2022-01-27 – 2022-01-28 (×2): 1 via ORAL
  Filled 2022-01-27 (×2): qty 1

## 2022-01-27 MED ORDER — THIAMINE HCL 100 MG/ML IJ SOLN: 100.0000 mg | Freq: Once | INTRAMUSCULAR | Status: DC

## 2022-01-27 MED ORDER — LOPERAMIDE HCL 2 MG PO CAPS: 2.0000 mg | ORAL_CAPSULE | ORAL | Status: DC | PRN

## 2022-01-27 MED ORDER — ONDANSETRON 4 MG PO TBDP: 4.0000 mg | ORAL_TABLET | Freq: Four times a day (QID) | ORAL | Status: DC | PRN

## 2022-01-27 NOTE — ED Notes (Signed)
NP at bedside.

## 2022-01-27 NOTE — ED Notes (Addendum)
Patient dressed out with 2 NTs and this RN  1 Blue top 1 Pearline Cables top 2 white tops Boots Jeans Black wallet (no cash) Black belt White socks Pearline Cables long john pants Blue beanie Grey underwear Sherpa jacket Black backpack  1 necklace  Cell phone  House keys  3 bags total

## 2022-01-27 NOTE — ED Notes (Signed)

## 2022-01-27 NOTE — Consult Note (Addendum)
[facility] Face-to-Face Psychiatry Consult   Reason for Consult:  SI/Depression Referring Physician:  EDP Patient Identification: Andrew Peters MRN:  [id number] Principal Diagnosis: Depression Diagnosis:  Principal Problem:   Depression Active Problems:   Suicidal ideation   Total Time spent with patient: 30 minutes  Subjective:   Andrew Peters is a 56 y.o. male patient admitted with SI/Depression.  HPI: Patient presents voluntarily to the ED for increased depression over the past 2 months, followed by suicidal ideation and a general feeling of wanting to hurt someone at school about 2 months ago.  He denies any thought or intent of harming anyone at this time.  He states that last night he was lying in bed and could not sleep and thought about getting a knife to kill himself.  Patient says he has been self isolating in his room for the past several months, drinking and not wanting to see anybody.  Patient reports that he drinks up to 24 beers per day, but he has not had any alcohol in 4 days.  On evaluation, patient was sleeping when approached.  He awaits and says that he "needs help."  He admits that he has a problem with alcohol, but says that he is also very depressed and does not have a lot of hope at this time.  He is vague, saying "just a lot of things going on." Per chart review, patient has had treatment for gastric cancer.  Patient states that he was diagnosed with schizophrenia in [place] around 2000, put on Risperdal and Zoloft.  He states he has not had any mental health care here in [place], since moving here around 2000.  Patient does not appear to be having any signs of alcohol withdrawal.  He is speaking in clear, coherent sentences.  No auditory or visual hallucinations.  Patient's hands are steady.  CIWA protocol initiated.  Writer alerted EDP, Dr. Charna Archer via secure chat,  of patient's alcohol use. Patient had a seizure in 2022, unknown if it was alcohol withdrawal  related. BAL is <10; UDS negative for all other substances.  Patient and his girlfriend of 5 years, live together. Writer got collateral from her:  April Summers. ([phone]): She reports that he does have a problems with alcohl, saying "I am a recovering alcoholic and I know." She gets tearful when she states she is very concerned about him and wants him to get help.  She states that he has been "cutting back" on the beer but she has a hard time coming up with the number of years that he has been drinking.  She is worried about his increasing depression.  Patient has history of gastric cancer and she knows that this is very depressing to him as well.  He did tell her he had a history of schizophrenia, diagnosed when he lived in [place] prior to 2000.  She stated that he tried to go to mental health provider in 2020, but COVID epidemic prevented good follow through.    Past Psychiatric History: Patient self reports that he might have been diagnosed with schizophrenia in the 20s while living in [place].  No suicide attempts no hospitalizations.  History of substance abuse.  Patient states he has not had any illicit drugs in many years.  Admits to alcohol use disorder.  Risk to Self:   Risk to Others:   Prior Inpatient Therapy:   Prior Outpatient Therapy:    Past Medical History:  Past Medical History:  Diagnosis  Date   Cancer [facility]) 2012   Gastrectomy   Paranoid schizophrenia [facility])     Past Surgical History:  Procedure Laterality Date   COLONOSCOPY WITH PROPOFOL N/A 11/13/2014   Procedure: COLONOSCOPY WITH PROPOFOL;  Surgeon: Hulen Luster, MD;  Location: [facility] ENDOSCOPY;  Service: Gastroenterology;  Laterality: N/A;   CYST EXCISION Left 04/26/2021   Procedure: CYST REMOVAL;  Surgeon: Herbert Pun, MD;  Location: [facility] ORS;  Service: General;  Laterality: Left;   ESOPHAGOGASTRODUODENOSCOPY (EGD) WITH PROPOFOL N/A 11/13/2014   Procedure: ESOPHAGOGASTRODUODENOSCOPY (EGD) WITH PROPOFOL;   Surgeon: Hulen Luster, MD;  Location: [facility] ENDOSCOPY;  Service: Gastroenterology;  Laterality: N/A;   STOMACH SURGERY     Removal of half of stomach due to cancer 2013 0r 2014.   Family History: History reviewed. No pertinent family history. Family Psychiatric  History:  Social History:  Social History   Substance and Sexual Activity  Alcohol Use Yes   Alcohol/week: 42.0 standard drinks of alcohol   Types: 42 Cans of beer per week     Social History   Substance and Sexual Activity  Drug Use No    Social History   Socioeconomic History   Marital status: Divorced    Spouse name: Not on file   Number of children: Not on file   Years of education: Not on file   Highest education level: Not on file  Occupational History   Not on file  Tobacco Use   Smoking status: Some Days    Packs/day: 0.25    Types: Cigarettes   Smokeless tobacco: Never  Vaping Use   Vaping Use: Never used  Substance and Sexual Activity   Alcohol use: Yes    Alcohol/week: 42.0 standard drinks of alcohol    Types: 42 Cans of beer per week   Drug use: No   Sexual activity: Not on file  Other Topics Concern   Not on file  Social History Narrative   Not on file   Social Determinants of Health   Financial Resource Strain: Not on file  Food Insecurity: Not on file  Transportation Needs: Not on file  Physical Activity: Not on file  Stress: Not on file  Social Connections: Not on file   Additional Social History:    Allergies:  No Known Allergies  Labs:  Results for orders placed or performed during the hospital encounter of 01/27/22 (from the past 48 hour(s))  Comprehensive metabolic panel     Status: Abnormal   Collection Time: 01/27/22 10:52 AM  Result Value Ref Range   Sodium 140 135 - 145 mmol/L   Potassium 3.7 3.5 - 5.1 mmol/L   Chloride 106 98 - 111 mmol/L   CO2 24 22 - 32 mmol/L   Glucose, Bld 95 70 - 99 mg/dL    Comment: Glucose reference range applies only to samples taken after  fasting for at least 8 hours.   BUN 29 (H) 6 - 20 mg/dL   Creatinine, Ser 1.44 (H) 0.61 - 1.24 mg/dL   Calcium 9.5 8.9 - 10.3 mg/dL   Total Protein 7.3 6.5 - 8.1 g/dL   Albumin 4.7 3.5 - 5.0 g/dL   AST 21 15 - 41 U/L   ALT 23 0 - 44 U/L   Alkaline Phosphatase 53 38 - 126 U/L   Total Bilirubin 1.2 0.3 - 1.2 mg/dL   GFR, Estimated 58 (L) >60 mL/min    Comment: (NOTE) Calculated using the CKD-EPI Creatinine Equation (2021)  Anion gap 10 5 - 15    Comment: Performed at [facility], [address] [place], [place] [address]  Ethanol     Status: None   Collection Time: 01/27/22 10:52 AM  Result Value Ref Range   Alcohol, Ethyl (B) <10 <10 mg/dL    Comment: (NOTE) Lowest detectable limit for serum alcohol is 10 mg/dL.  For medical purposes only. Performed at [facility], [address] [place], [place] [address]   Salicylate level     Status: Abnormal   Collection Time: 01/27/22 10:52 AM  Result Value Ref Range   Salicylate Lvl <6.2 (L) 7.0 - 30.0 mg/dL    Comment: Performed at [facility], [address] [place], [place] [address]  Acetaminophen level     Status: Abnormal   Collection Time: 01/27/22 10:52 AM  Result Value Ref Range   Acetaminophen (Tylenol), Serum <10 (L) 10 - 30 ug/mL    Comment: (NOTE) Therapeutic concentrations vary significantly. A range of 10-30 ug/mL  may be an effective concentration for many patients. However, some  are best treated at concentrations outside of this range. Acetaminophen concentrations >150 ug/mL at 4 hours after ingestion  and >50 ug/mL at 12 hours after ingestion are often associated with  toxic reactions.  Performed at [facility], [address] [place], [place] [address]   cbc     Status: Abnormal   Collection Time: 01/27/22 10:52 AM  Result Value Ref Range   WBC 5.5 4.0 - 10.5 K/uL   RBC 5.42 4.22 - 5.81 MIL/uL   Hemoglobin 17.1 (H) 13.0 - 17.0 g/dL   HCT 49.2 39.0 - 52.0 %    MCV 90.8 80.0 - 100.0 fL   MCH 31.5 26.0 - 34.0 pg   MCHC 34.8 30.0 - 36.0 g/dL   RDW 12.4 11.5 - 15.5 %   Platelets 292 150 - 400 K/uL   nRBC 0.0 0.0 - 0.2 %    Comment: Performed at [facility], [address] [place],  [address]  Urine Drug Screen, Qualitative     Status: None   Collection Time: 01/27/22 10:52 AM  Result Value Ref Range   Tricyclic, Ur Screen NONE DETECTED NONE DETECTED   Amphetamines, Ur Screen NONE DETECTED NONE DETECTED   MDMA (Ecstasy)Ur Screen NONE DETECTED NONE DETECTED   Cocaine Metabolite,Ur [place] NONE DETECTED NONE DETECTED   Opiate, Ur Screen NONE DETECTED NONE DETECTED   Phencyclidine (PCP) Ur S NONE DETECTED NONE DETECTED   Cannabinoid 50 Ng, Ur [place] NONE DETECTED NONE DETECTED   Barbiturates, Ur Screen NONE DETECTED NONE DETECTED   Benzodiazepine, Ur Scrn NONE DETECTED NONE DETECTED   Methadone Scn, Ur NONE DETECTED NONE DETECTED    Comment: (NOTE) Tricyclics + metabolites, urine    Cutoff 1000 ng/mL Amphetamines + metabolites, urine  Cutoff 1000 ng/mL MDMA (Ecstasy), urine              Cutoff 500 ng/mL Cocaine Metabolite, urine          Cutoff 300 ng/mL Opiate + metabolites, urine        Cutoff 300 ng/mL Phencyclidine (PCP), urine         Cutoff 25 ng/mL Cannabinoid, urine                 Cutoff 50 ng/mL Barbiturates + metabolites, urine  Cutoff 200 ng/mL Benzodiazepine, urine              Cutoff 200  ng/mL Methadone, urine                   Cutoff 300 ng/mL  The urine drug screen provides only a preliminary, unconfirmed analytical test result and should not be used for non-medical purposes. Clinical consideration and professional judgment should be applied to any positive drug screen result due to possible interfering substances. A more specific alternate chemical method must be used in order to obtain a confirmed analytical result. Gas chromatography / mass spectrometry (GC/MS) is the preferred confirm atory method. Performed  at [facility], [address] [place], [place] [address]     Current Facility-Administered Medications  Medication Dose Route Frequency Provider Last Rate Last Admin   hydrOXYzine (ATARAX) tablet 25 mg  25 mg Oral Q6H PRN Waldon Merl F, NP       loperamide (IMODIUM) capsule 2-4 mg  2-4 mg Oral PRN Sherlon Handing, NP       LORazepam (ATIVAN) tablet 1 mg  1 mg Oral Q6H PRN Sherlon Handing, NP       multivitamin with minerals tablet 1 tablet  1 tablet Oral Daily Floy Angert F, NP       ondansetron (ZOFRAN-ODT) disintegrating tablet 4 mg  4 mg Oral Q6H PRN Sherlon Handing, NP       thiamine (VITAMIN B1) injection 100 mg  100 mg Intramuscular Once Sherlon Handing, NP       [START ON 01/28/2022] thiamine (VITAMIN B1) tablet 100 mg  100 mg Oral Daily Sherlon Handing, NP       Current Outpatient Medications  Medication Sig Dispense Refill   benzonatate (TESSALON PERLES) 100 MG capsule Take 1-2 tabs TID prn cough 30 capsule 0   brompheniramine-pseudoephedrine-DM 30-2-10 MG/5ML syrup Take 5 mLs by mouth 4 (four) times daily as needed. 120 mL 0   ibuprofen (ADVIL) 600 MG tablet Take 1 tablet (600 mg total) by mouth every 8 (eight) hours as needed. 15 tablet 0   levETIRAcetam (KEPPRA) 500 MG tablet Take 1 tablet (500 mg total) by mouth 2 (two) times daily. 60 tablet 1   ondansetron (ZOFRAN-ODT) 4 MG disintegrating tablet Take 1 tablet (4 mg total) by mouth every 8 (eight) hours as needed. 20 tablet 0   pantoprazole (PROTONIX) 40 MG tablet Take 1 tablet (40 mg total) by mouth daily for 14 days. 14 tablet 0   promethazine (PHENERGAN) 25 MG suppository Place 1 suppository (25 mg total) rectally every 6 (six) hours as needed for nausea or vomiting. 12 each 0   tamsulosin (FLOMAX) 0.4 MG CAPS capsule Take 1 capsule (0.4 mg total) by mouth daily. 90 capsule 3    Musculoskeletal: Strength & Muscle Tone: within normal limits Gait & Station: normal Patient leans:  N/A    Psychiatric Specialty Exam:  Presentation  General Appearance: Appropriate for Environment  Eye Contact:Good  Speech:Clear and Coherent  Speech Volume:Normal  Handedness:Right   Mood and Affect  Mood:Depressed  Affect:Blunt; Congruent   Thought Process  Thought Processes:Coherent  Descriptions of Associations:Intact  Orientation:Full (Time, Place and Person)  Thought Content:WDL  History of Schizophrenia/Schizoaffective disorder:No data recorded Duration of Psychotic Symptoms:No data recorded Hallucinations:Hallucinations: None  Ideas of Reference:None  Suicidal Thoughts:Suicidal Thoughts: Yes, Passive  Homicidal Thoughts:Homicidal Thoughts: No   Sensorium  Memory:Immediate Good  Judgment:Fair  Insight:Fair   Executive Functions  Concentration:Fair  Attention Span:Fair  [place]   Psychomotor Activity  Psychomotor Activity:Psychomotor Activity:  Normal   Assets  Assets:Communication Skills; Desire for Improvement; Financial Resources/Insurance; Housing; Resilience   Sleep  Sleep:Sleep: Poor   Physical Exam: Physical Exam Vitals and nursing note reviewed.  HENT:     Head: Normocephalic.     Nose: No congestion or rhinorrhea.  Eyes:     General:        Right eye: No discharge.        Left eye: No discharge.  Cardiovascular:     Rate and Rhythm: Normal rate.  Pulmonary:     Effort: Pulmonary effort is normal.  Musculoskeletal:        General: Normal range of motion.     Cervical back: Normal range of motion.  Skin:    General: Skin is dry.  Neurological:     Mental Status: He is alert.  Psychiatric:        Attention and Perception: Attention normal.        Mood and Affect: Mood is depressed. Affect is blunt.        Speech: Speech normal.        Behavior: Behavior is cooperative.        Thought Content: Thought content is not paranoid or delusional. Thought content  includes suicidal ideation. Thought content does not include homicidal ideation. Thought content does not include homicidal or suicidal plan.        Cognition and Memory: Cognition normal.        Judgment: Judgment normal.    Review of Systems  Constitutional: Negative.   HENT: Negative.    Respiratory: Negative.    Cardiovascular: Negative.   Psychiatric/Behavioral:  Positive for depression, substance abuse and suicidal ideas. Negative for hallucinations and memory loss. The patient is not nervous/anxious and does not have insomnia.    Blood pressure (!) 119/95, pulse 99, temperature 98 F (36.7 C), temperature source Oral, resp. rate 18, height '5\' 7"'$  (1.702 m), weight 84.8 kg, SpO2 97 %. Body mass index is 29.29 kg/m.  Treatment Plan Summary: Daily contact with patient to assess and evaluate symptoms and progress in treatment and Plan psychiatric hospitalization when medically cleared.  Patient was placed on CIWA protocol.  Reviewed with Dr. Charna Archer.  Disposition: Recommend psychiatric Inpatient admission when medically cleared.  Sherlon Handing, NP 01/27/2022 3:30 PM

## 2022-01-27 NOTE — BH Assessment (Signed)
Patient unable to be admitted to [facility] tonight due to staffing issues for tomorrow 01/28/22. TTS to follow-up with unit tomorrow to assess the staffing issues and see if issue will be resolved.

## 2022-01-27 NOTE — ED Provider Notes (Signed)
[facility] Provider Note    Event Date/Time   First MD Initiated Contact with Patient 01/27/22 1106     (approximate)   History   Chief Complaint Depression and Homicidal   HPI  Atreyu Mak is a 56 y.o. male with past medical history of schizophrenia and gastric cancer who presents to the ED complaining of depression.  Patient reports that he has been feeling increasingly depressed over the past couple of months, states he is now having thoughts of harming other people.  He states he does not want to act on these thoughts, decided to seek help here in the ED instead.  He denies any thoughts of harming others, but does report auditory hallucinations telling him to harm others.  He denies any medical complaints at this time, denies any drug or alcohol abuse.     Physical Exam   Triage Vital Signs: ED Triage Vitals  Enc Vitals Group     BP 01/27/22 1049 (!) 119/95     Pulse Rate 01/27/22 1049 99     Resp 01/27/22 1049 18     Temp 01/27/22 1049 98 F (36.7 C)     Temp Source 01/27/22 1049 Oral     SpO2 01/27/22 1049 97 %     Weight 01/27/22 1051 187 lb (84.8 kg)     Height 01/27/22 1051 '5\' 7"'$  (1.702 m)     Head Circumference --      Peak Flow --      Pain Score 01/27/22 1050 0     Pain Loc --      Pain Edu? --      Excl. in [place]? --     Most recent vital signs: Vitals:   01/27/22 1049  BP: (!) 119/95  Pulse: 99  Resp: 18  Temp: 98 F (36.7 C)  SpO2: 97%    Constitutional: Alert and oriented. Eyes: Conjunctivae are normal. Head: Atraumatic. Nose: No congestion/rhinnorhea. Mouth/Throat: Mucous membranes are moist.  Cardiovascular: Normal rate, regular rhythm. Grossly normal heart sounds.  2+ radial pulses bilaterally. Respiratory: Normal respiratory effort.  No retractions. Lungs CTAB. Gastrointestinal: Soft and nontender. No distention. Musculoskeletal: No lower extremity tenderness nor edema.  Neurologic:  Normal speech and  language. No gross focal neurologic deficits are appreciated.    ED Results / Procedures / Treatments   Labs (all labs ordered are listed, but only abnormal results are displayed) Labs Reviewed  COMPREHENSIVE METABOLIC PANEL - Abnormal; Notable for the following components:      Result Value   BUN 29 (*)    Creatinine, Ser 1.44 (*)    GFR, Estimated 58 (*)    All other components within normal limits  SALICYLATE LEVEL - Abnormal; Notable for the following components:   Salicylate Lvl <0.6 (*)    All other components within normal limits  ACETAMINOPHEN LEVEL - Abnormal; Notable for the following components:   Acetaminophen (Tylenol), Serum <10 (*)    All other components within normal limits  CBC - Abnormal; Notable for the following components:   Hemoglobin 17.1 (*)    All other components within normal limits  ETHANOL  URINE DRUG SCREEN, QUALITATIVE ([facility] ONLY)    PROCEDURES:  Critical Care performed: No  Procedures   MEDICATIONS ORDERED IN ED: Medications - No data to display   IMPRESSION / MDM / [place] / ED COURSE  I reviewed the triage vital signs and the nursing notes.  56 y.o. male with past medical history of schizophrenia and gastric cancer who presents to the ED complaining of auditory hallucinations telling him to harm others as well as increasing depression.  Patient's presentation is most consistent with acute presentation with potential threat to life or bodily function.  Differential diagnosis includes, but is not limited to, psychosis, suicidal ideation, homicidal ideation, substance abuse, depression.  Patient well-appearing and in no acute distress, vital signs are unremarkable and he denies any medical complaints.  Screening labs are reassuring with no significant anemia, leukocytosis, electrolyte abnormality, or AKI.  Tylenol and salicylate levels are undetectable.  Patient states he is seeking help and  wants to be restarted on his prior medications, reports hallucinations with thoughts of harming others but does not name anyone specific, is calm and cooperative here in the ED.  We will have psychiatry evaluate him, patient may be medically cleared for psychiatric disposition.  The patient has been placed in psychiatric observation due to the need to provide a safe environment for the patient while obtaining psychiatric consultation and evaluation, as well as ongoing medical and medication management to treat the patient's condition.  The patient has not been placed under full IVC at this time.      FINAL CLINICAL IMPRESSION(S) / ED DIAGNOSES   Final diagnoses:  Depression, unspecified depression type  Hallucinations     Rx / DC Orders   ED Discharge Orders     None        Note:  This document was prepared using Dragon voice recognition software and may include unintentional dictation errors.   Blake Divine, MD 01/27/22 1157

## 2022-01-27 NOTE — ED Notes (Signed)
Unable to get vitals due to pt sleeping.  Will obtain when he wakes

## 2022-01-27 NOTE — ED Triage Notes (Signed)
Patient arrives ambulatory by POV c/o depression going on for a while. Patient tearful in triage. Patient denies any suicidal ideations but states he is having thoughts over the past couple days of wanting to harm others.

## 2022-01-27 NOTE — ED Notes (Signed)
Pt given lunch tray and drink at this time.

## 2022-01-28 ENCOUNTER — Inpatient Hospital Stay
Admission: AD | Admit: 2022-01-28 | Discharge: 2022-02-02 | DRG: 885 | Disposition: A | Discharge status: Home / Self Care | Payer: Medicaid Other | Source: Intra-hospital | Attending: Family Medicine | Admitting: Family Medicine

## 2022-01-28 ENCOUNTER — Encounter: Payer: Self-pay | Admitting: Psychiatry

## 2022-01-28 ENCOUNTER — Other Ambulatory Visit: Payer: Self-pay

## 2022-01-28 DIAGNOSIS — F22 Delusional disorders: Secondary | ICD-10-CM | POA: Diagnosis present

## 2022-01-28 DIAGNOSIS — Z79899 Other long term (current) drug therapy: Secondary | ICD-10-CM

## 2022-01-28 DIAGNOSIS — F332 Major depressive disorder, recurrent severe without psychotic features: Secondary | ICD-10-CM | POA: Diagnosis not present

## 2022-01-28 DIAGNOSIS — F419 Anxiety disorder, unspecified: Secondary | ICD-10-CM | POA: Diagnosis present

## 2022-01-28 DIAGNOSIS — N400 Enlarged prostate without lower urinary tract symptoms: Secondary | ICD-10-CM | POA: Diagnosis present

## 2022-01-28 DIAGNOSIS — Z85028 Personal history of other malignant neoplasm of stomach: Secondary | ICD-10-CM | POA: Diagnosis not present

## 2022-01-28 DIAGNOSIS — R45851 Suicidal ideations: Secondary | ICD-10-CM | POA: Diagnosis present

## 2022-01-28 DIAGNOSIS — F251 Schizoaffective disorder, depressive type: Secondary | ICD-10-CM | POA: Diagnosis present

## 2022-01-28 DIAGNOSIS — Z62810 Personal history of physical and sexual abuse in childhood: Secondary | ICD-10-CM

## 2022-01-28 DIAGNOSIS — F1010 Alcohol abuse, uncomplicated: Secondary | ICD-10-CM | POA: Diagnosis present

## 2022-01-28 DIAGNOSIS — Z9151 Personal history of suicidal behavior: Secondary | ICD-10-CM

## 2022-01-28 DIAGNOSIS — Z560 Unemployment, unspecified: Secondary | ICD-10-CM

## 2022-01-28 DIAGNOSIS — G40909 Epilepsy, unspecified, not intractable, without status epilepticus: Secondary | ICD-10-CM | POA: Diagnosis present

## 2022-01-28 DIAGNOSIS — F17210 Nicotine dependence, cigarettes, uncomplicated: Secondary | ICD-10-CM | POA: Diagnosis present

## 2022-01-28 DIAGNOSIS — F32A Depression, unspecified: Principal | ICD-10-CM | POA: Diagnosis present

## 2022-01-28 MED ORDER — LEVETIRACETAM 500 MG PO TABS
500.0000 mg | ORAL_TABLET | Freq: Two times a day (BID) | ORAL | Status: DC
  Administered 2022-01-28: 500 mg via ORAL
  Filled 2022-01-28: qty 1

## 2022-01-28 MED ORDER — ADULT MULTIVITAMIN W/MINERALS CH
1.0000 | ORAL_TABLET | Freq: Every day | ORAL | Status: DC
  Administered 2022-01-29 – 2022-01-30 (×2): 1 via ORAL
  Filled 2022-01-28 (×2): qty 1

## 2022-01-28 MED ORDER — LOPERAMIDE HCL 2 MG PO CAPS: 2.0000 mg | ORAL_CAPSULE | ORAL | Status: AC | PRN

## 2022-01-28 MED ORDER — MAGNESIUM HYDROXIDE 400 MG/5ML PO SUSP: 30.0000 mL | Freq: Every day | ORAL | Status: DC | PRN

## 2022-01-28 MED ORDER — THIAMINE MONONITRATE 100 MG PO TABS
100.0000 mg | ORAL_TABLET | Freq: Every day | ORAL | Status: DC
  Administered 2022-01-29 – 2022-01-30 (×2): 100 mg via ORAL
  Filled 2022-01-28 (×2): qty 1

## 2022-01-28 MED ORDER — HYDROXYZINE HCL 25 MG PO TABS: 25.0000 mg | ORAL_TABLET | Freq: Four times a day (QID) | ORAL | Status: AC | PRN

## 2022-01-28 MED ORDER — CHLORTHALIDONE 25 MG PO TABS
25.0000 mg | ORAL_TABLET | Freq: Every day | ORAL | Status: DC
  Administered 2022-01-28: 25 mg via ORAL
  Filled 2022-01-28: qty 1

## 2022-01-28 MED ORDER — ALUM & MAG HYDROXIDE-SIMETH 200-200-20 MG/5ML PO SUSP: 30.0000 mL | ORAL | Status: DC | PRN

## 2022-01-28 MED ORDER — ONDANSETRON 4 MG PO TBDP: 4.0000 mg | ORAL_TABLET | Freq: Four times a day (QID) | ORAL | Status: AC | PRN

## 2022-01-28 MED ORDER — PRAVASTATIN SODIUM 20 MG PO TABS: 20.0000 mg | ORAL_TABLET | Freq: Every day | ORAL | Status: DC

## 2022-01-28 MED ORDER — NICOTINE 14 MG/24HR TD PT24
14.0000 mg | MEDICATED_PATCH | Freq: Every day | TRANSDERMAL | Status: DC
  Filled 2022-01-28 (×3): qty 1

## 2022-01-28 MED ORDER — ACETAMINOPHEN 325 MG PO TABS: 650.0000 mg | ORAL_TABLET | Freq: Four times a day (QID) | ORAL | Status: DC | PRN

## 2022-01-28 MED ORDER — PANTOPRAZOLE SODIUM 40 MG PO TBEC
40.0000 mg | DELAYED_RELEASE_TABLET | Freq: Every day | ORAL | Status: DC
  Administered 2022-01-28: 40 mg via ORAL
  Filled 2022-01-28: qty 1

## 2022-01-28 MED ORDER — LORAZEPAM 1 MG PO TABS: 1.0000 mg | ORAL_TABLET | Freq: Four times a day (QID) | ORAL | Status: DC | PRN

## 2022-01-28 NOTE — ED Notes (Signed)
VOL  PENDING  PLACEMENT 

## 2022-01-28 NOTE — ED Notes (Signed)
Pt given phone to make phone call.  

## 2022-01-28 NOTE — Tx Team (Signed)
Initial Treatment Plan 01/28/2022 11:51 PM [name] PQA:[id number]    PATIENT STRESSORS: Loss of father and brother within the last year   Marital or family conflict   Medication change or noncompliance   Substance abuse   Other: depression     PATIENT STRENGTHS: Capable of independent living  Communication skills  Motivation for treatment/growth  Physical Health    PATIENT IDENTIFIED PROBLEMS: Ineffective coping skills  Alcohol abuse                   DISCHARGE CRITERIA:  Improved stabilization in mood, thinking, and/or behavior Motivation to continue treatment in a less acute level of care  PRELIMINARY DISCHARGE PLAN: Attend 12-step recovery group Outpatient therapy Return to previous living arrangement  PATIENT/FAMILY INVOLVEMENT: This treatment plan has been presented to and reviewed with the patient, Andrew Peters, and/or family member.  The patient and family have been given the opportunity to ask questions and make suggestions.  Aleen Sells, RN 01/28/2022, 11:51 PM

## 2022-01-28 NOTE — ED Notes (Signed)
Pt resting in room, respirations even and unlabored at this time.

## 2022-01-28 NOTE — ED Notes (Signed)
Pt currently sleeping, breakfast left at bedside.

## 2022-01-28 NOTE — ED Notes (Signed)
Pt given snack and drink.  Pt laying in bed watching TV

## 2022-01-28 NOTE — ED Notes (Signed)
Pt given lunch tray and drink at this time.

## 2022-01-28 NOTE — Plan of Care (Signed)
Pt endorses anxiety/depression at this time. Pt denies SI/HI/AVH or pain at this time. Pt is calm and cooperative. Pt provided with support and encouragement. Pt monitored q15 minutes for safety per unit policy. Plan of care ongoing.   Pt reports moving down to [place] from [place] after the funeral of a family member. Pt lives with his girlfriend in his apartment. He reports having no support system. He states tries to talk with his girlfriend but she turns the conversation to being about herself which then leads to a verbal conflict and him leaving the apartment to cool off. Pt reports losing his father last year who was 72 years old and his brother to an asthma attack at the age of 70. Pt believes that was more to his brother's death because he stated his brother always carried his rescue inhaler with him. Pt also reports that is uncle attempted suicide by gun however, survived and is still living. When asked what brought him here the pt replied feeling depressed, stressed out, the holidays coming, and missing loved ones. Pt is calm and cooperative during assessment. Pt denies SI/HI/AVH during admission assessment. Pt reports feeling safe while here. Pt reports wanting here with his alcohol abuse.   Problem: Coping: Goal: Will verbalize feelings Outcome: Progressing   Problem: Self-Concept: Goal: Level of anxiety will decrease Outcome: Not Progressing

## 2022-01-28 NOTE — BH Assessment (Signed)
Patient is to be admitted to [facility] by Psychiatric Nurse Practitioner  Sharon Mt .  Attending Physician will be Dr.  Weber Cooks .   Patient has been assigned to room 310, by [facility] Charge Nurse [place].   Intake Paper Work has been signed and placed on patient chart.  ER staff is aware of the admission: Melody, ER Secretary   Dr. Joni Fears, ER MD  Maudie Mercury, Patient's Nurse  Helene Kelp, Patient Access.   Pt can be transported ASAP.

## 2022-01-28 NOTE — ED Notes (Signed)
VOL/pending inpatient psych admission when medically cleared

## 2022-01-28 NOTE — ED Notes (Signed)
EDP made aware pt home medications have yet to be ordered.

## 2022-01-28 NOTE — ED Notes (Signed)
Report given to [facility] in BMU.

## 2022-01-29 DIAGNOSIS — F332 Major depressive disorder, recurrent severe without psychotic features: Secondary | ICD-10-CM

## 2022-01-29 MED ORDER — ESCITALOPRAM OXALATE 10 MG PO TABS
10.0000 mg | ORAL_TABLET | Freq: Every day | ORAL | Status: DC
  Administered 2022-01-29 – 2022-02-02 (×5): 10 mg via ORAL
  Filled 2022-01-29 (×5): qty 1

## 2022-01-29 MED ORDER — TRAZODONE HCL 50 MG PO TABS
50.0000 mg | ORAL_TABLET | Freq: Every day | ORAL | Status: DC
  Administered 2022-01-29: 50 mg via ORAL
  Filled 2022-01-29: qty 1

## 2022-01-29 MED ORDER — RISPERIDONE 1 MG PO TABS
0.5000 mg | ORAL_TABLET | Freq: Every day | ORAL | Status: DC
  Administered 2022-01-29 – 2022-01-30 (×2): 0.5 mg via ORAL
  Filled 2022-01-29 (×2): qty 1

## 2022-01-29 MED ORDER — LEVETIRACETAM 500 MG PO TABS
500.0000 mg | ORAL_TABLET | Freq: Two times a day (BID) | ORAL | Status: DC
  Administered 2022-01-29 – 2022-02-02 (×8): 500 mg via ORAL
  Filled 2022-01-29 (×9): qty 1
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
## 2022-01-29 NOTE — Plan of Care (Incomplete)
Pt endorses anxiety/depression at this time. Pt denies SI/HI/AVH or pain at this time. Pt is calm and cooperative. Pt provided with support and encouragement. Pt monitored q15 minutes for safety per unit policy. Plan of care ongoing.   Pt reports moving down to [place] from [place] after the funeral of a family member. Pt lives with his girlfriend in his apartment. He reports having no support system. He states tries to talk with his girlfriend but she turns the conversation to being about herself which then leads to a verbal conflict and him leaving the apartment to cool off. Pt reports losing his father last year who was 95 years ol  Problem: Coping: Goal: Will verbalize feelings Outcome: Progressing   Problem: Self-Concept: Goal: Level of anxiety will decrease Outcome: Not Progressing

## 2022-01-29 NOTE — Plan of Care (Signed)
D- Patient alert and oriented. Patient presented in an anxious, but pleasant mood on assessment reporting that he didn't sleep well last night, stating "I couldn't sleep", but did not have any complaints to voice to this Probation officer. Patient denied depression, but endorsed hopelessness, and anxiety, stating "everything I've been through, I try and it gets good for a while, it's not getting any better". Patient also denied SI, HI, AVH, and pain to this Probation officer. Per his self-inventory, patient's stated goal for today is "understanding my depression".  A- Scheduled medications administered to patient, per MD orders. Support and encouragement provided.  Routine safety checks conducted every 15 minutes.  Patient informed to notify staff with problems or concerns.  R- No adverse drug reactions noted. Patient contracts for safety at this time. Patient compliant with medications. Patient receptive, calm, and cooperative. Patient interacts well with others on the unit. Patient remains safe at this time.  Problem: Education: Goal: Knowledge of General Education information will improve Description: Including pain rating scale, medication(s)/side effects and non-pharmacologic comfort measures Outcome: Progressing   Problem: Health Behavior/Discharge Planning: Goal: Ability to manage health-related needs will improve Outcome: Progressing   Problem: Clinical Measurements: Goal: Ability to maintain clinical measurements within normal limits will improve Outcome: Progressing Goal: Will remain free from infection Outcome: Progressing Goal: Diagnostic test results will improve Outcome: Progressing Goal: Respiratory complications will improve Outcome: Progressing Goal: Cardiovascular complication will be avoided Outcome: Progressing   Problem: Activity: Goal: Risk for activity intolerance will decrease Outcome: Progressing   Problem: Nutrition: Goal: Adequate nutrition will be maintained Outcome:  Progressing   Problem: Coping: Goal: Level of anxiety will decrease Outcome: Progressing   Problem: Elimination: Goal: Will not experience complications related to bowel motility Outcome: Progressing Goal: Will not experience complications related to urinary retention Outcome: Progressing   Problem: Pain Managment: Goal: General experience of comfort will improve Outcome: Progressing   Problem: Safety: Goal: Ability to remain free from injury will improve Outcome: Progressing   Problem: Skin Integrity: Goal: Risk for impaired skin integrity will decrease Outcome: Progressing   Problem: Education: Goal: Knowledge of [facility] Education information/materials will improve Outcome: Progressing Goal: Emotional status will improve Outcome: Progressing Goal: Mental status will improve Outcome: Progressing Goal: Verbalization of understanding the information provided will improve Outcome: Progressing   Problem: Activity: Goal: Interest or engagement in activities will improve Outcome: Progressing Goal: Sleeping patterns will improve Outcome: Progressing   Problem: Coping: Goal: Ability to verbalize frustrations and anger appropriately will improve Outcome: Progressing Goal: Ability to demonstrate self-control will improve Outcome: Progressing   Problem: Health Behavior/Discharge Planning: Goal: Identification of resources available to assist in meeting health care needs will improve Outcome: Progressing Goal: Compliance with treatment plan for underlying cause of condition will improve Outcome: Progressing   Problem: Physical Regulation: Goal: Ability to maintain clinical measurements within normal limits will improve Outcome: Progressing   Problem: Safety: Goal: Periods of time without injury will increase Outcome: Progressing   Problem: Education: Goal: Ability to state activities that reduce stress will improve Outcome: Progressing   Problem:  Coping: Goal: Ability to identify and develop effective coping behavior will improve Outcome: Progressing   Problem: Self-Concept: Goal: Ability to identify factors that promote anxiety will improve Outcome: Progressing Goal: Level of anxiety will decrease Outcome: Progressing Goal: Ability to modify response to factors that promote anxiety will improve Outcome: Progressing   Problem: Education: Goal: Utilization of techniques to improve thought processes will improve Outcome: Progressing Goal: Knowledge of  the prescribed therapeutic regimen will improve Outcome: Progressing   Problem: Activity: Goal: Interest or engagement in leisure activities will improve Outcome: Progressing Goal: Imbalance in normal sleep/wake cycle will improve Outcome: Progressing   Problem: Coping: Goal: Coping ability will improve Outcome: Progressing Goal: Will verbalize feelings Outcome: Progressing   Problem: Health Behavior/Discharge Planning: Goal: Ability to make decisions will improve Outcome: Progressing Goal: Compliance with therapeutic regimen will improve Outcome: Progressing   Problem: Role Relationship: Goal: Will demonstrate positive changes in social behaviors and relationships Outcome: Progressing   Problem: Safety: Goal: Ability to disclose and discuss suicidal ideas will improve Outcome: Progressing Goal: Ability to identify and utilize support systems that promote safety will improve Outcome: Progressing   Problem: Self-Concept: Goal: Will verbalize positive feelings about self Outcome: Progressing Goal: Level of anxiety will decrease Outcome: Progressing

## 2022-01-30 DIAGNOSIS — F332 Major depressive disorder, recurrent severe without psychotic features: Secondary | ICD-10-CM | POA: Diagnosis not present

## 2022-01-30 MED ORDER — TRAZODONE HCL 100 MG PO TABS
100.0000 mg | ORAL_TABLET | Freq: Every day | ORAL | Status: DC
  Administered 2022-01-30: 100 mg via ORAL
  Filled 2022-01-30: qty 1

## 2022-01-30 NOTE — Plan of Care (Signed)
Pt is calm and cooperative, denies SI HI AVH, no complaints of physical distress.  Pt is compliant with medication Pt is somewhat isolative to his room but does spend some time in the dayroom.  Continued monitoring for safety via q 15 minute checks.    Problem: Coping: Goal: Level of anxiety will decrease Outcome: Progressing   Problem: Activity: Goal: Interest or engagement in activities will improve Outcome: Progressing Goal: Sleeping patterns will improve Outcome: Progressing   Problem: Activity: Goal: Sleeping patterns will improve Outcome: Progressing   Problem: Coping: Goal: Ability to verbalize frustrations and anger appropriately will improve Outcome: Progressing Goal: Ability to demonstrate self-control will improve Outcome: Progressing   Problem: Health Behavior/Discharge Planning: Goal: Identification of resources available to assist in meeting health care needs will improve Outcome: Progressing   Problem: Health Behavior/Discharge Planning: Goal: Compliance with treatment plan for underlying cause of condition will improve Outcome: Progressing   Problem: Education: Goal: Utilization of techniques to improve thought processes will improve Outcome: Progressing Goal: Knowledge of the prescribed therapeutic regimen will improve Outcome: Progressing

## 2022-01-30 NOTE — Plan of Care (Signed)

## 2022-01-30 NOTE — Progress Notes (Signed)
Patient is pleasant and cooperative.  Received his QHS medication without issue. Denies having any psychotic symptoms. Reports depression and anxiety are better. Brighter mood and affect since having visitor. Encouraged to speak to staff with any concerns that he may have. Q15 minute safety checks in place.     C Butler-Nicholson, LPN
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
## 2022-01-30 NOTE — Progress Notes (Addendum)
[facility] MD Progress Note  01/30/2022 8:08 AM Andrew Peters  MRN:  [id number] Subjective:   Patient has been appropriate on the unit, selectively social.  He reports ongoing depression in addition to feelings of hopelessness nests helplessness and worthlessness.  On a positive note, the states that he slept much better than he usually does but woke up around 2 AM and because of a another patient yelling on the unit around that time.  No side effects on his medications.  No new medical problems.  Denies symptoms of alcohol withdrawal.  Reflects that he had been using a lot of drugs particularly crack cocaine in the late 90s in [place] and so they may have contributed to the hallucinations.  Notes that the auditory hallucinations he experiences are an unfamiliar voice and occasionally command, recently telling him to "go outside."  Denies suicidal or homicidal thoughts.  Looks forward to his girlfriend coming to visit him on the unit.  Last use alcohol approximately 6 days ago.  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   Paranoid schizophrenia ([place])     Past Surgical History:  Procedure Laterality Date   COLONOSCOPY WITH PROPOFOL N/A 11/13/2014   Procedure: COLONOSCOPY WITH PROPOFOL;  Surgeon: Hulen Luster, MD;  Location: [facility] ENDOSCOPY;  Service: Gastroenterology;  Laterality: N/A;   CYST EXCISION Left 04/26/2021   Procedure: CYST REMOVAL;  Surgeon: Herbert Pun, MD;  Location: [facility] ORS;  Service: General;  Laterality: Left;   ESOPHAGOGASTRODUODENOSCOPY (EGD) WITH PROPOFOL N/A 11/13/2014   Procedure: ESOPHAGOGASTRODUODENOSCOPY (EGD) WITH PROPOFOL;  Surgeon: Hulen Luster, MD;  Location: [facility] ENDOSCOPY;  Service: Gastroenterology;  Laterality: N/A;   STOMACH SURGERY     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   Family History: History reviewed. No pertinent family history. Social History:  Social History   Substance and Sexual Activity  Alcohol Use Yes   Alcohol/week: 42.0 standard drinks of alcohol   Types: 42 Cans of beer per week     Social History   Substance and Sexual Activity  Drug Use No    Social History   Socioeconomic History   Marital status: Divorced    Spouse name: Not on file   Number of children: Not on file   Years of education: Not on file   Highest education level: Not on file  Occupational History   Not on file  Tobacco Use   Smoking status: Some Days    Packs/day: 0.25    Types: Cigarettes   Smokeless tobacco: Never  Vaping Use   Vaping Use: Never used  Substance and Sexual Activity   Alcohol use: Yes    Alcohol/week: 42.0 standard drinks of alcohol    Types: 42 Cans of beer per week   Drug use: No   Sexual activity: Yes    Birth control/protection: Condom  Other Topics Concern   Not on file  Social History Narrative   Pt reports he has a girlfriend who lives with him in his apartment   Social Determinants of Health   Financial Resource Strain: Not on file  Food Insecurity: No Food Insecurity (01/28/2022)   Hunger Vital Sign    Worried About Running Out of Food in the Last Year: Never true    Ran Out of Food in the  Last Year: Never true  Transportation Needs: No Transportation Needs (01/28/2022)   PRAPARE - Hydrologist (Medical): No    Lack of Transportation (Non-Medical): No  Physical Activity: Not on file  Stress: Not on file  Social Connections: Not on file   Additional Social History:                         Sleep: Fair  Appetite:  Fair  Current Medications: Current Facility-Administered Medications  Medication Dose Route Frequency Provider Last Rate Last Admin   acetaminophen (TYLENOL) tablet 650 mg  650 mg Oral Q6H PRN Sherlon Handing, NP       alum & mag  hydroxide-simeth (MAALOX/MYLANTA) 200-200-20 MG/5ML suspension 30 mL  30 mL Oral Q4H PRN Sherlon Handing, NP       escitalopram (LEXAPRO) tablet 10 mg  10 mg Oral Daily Rulon Sera, MD   10 mg at 01/29/22 1213   hydrOXYzine (ATARAX) tablet 25 mg  25 mg Oral Q6H PRN Sherlon Handing, NP       levETIRAcetam (KEPPRA) tablet 500 mg  500 mg Oral BID Rulon Sera, MD   500 mg at 01/29/22 1213   loperamide (IMODIUM) capsule 2-4 mg  2-4 mg Oral PRN Sherlon Handing, NP       LORazepam (ATIVAN) tablet 1 mg  1 mg Oral Q6H PRN Sherlon Handing, NP       magnesium hydroxide (MILK OF MAGNESIA) suspension 30 mL  30 mL Oral Daily PRN Sherlon Handing, NP       multivitamin with minerals tablet 1 tablet  1 tablet Oral Daily Waldon Merl F, NP   1 tablet at 01/29/22 0845   nicotine (NICODERM CQ - dosed in mg/24 hours) patch 14 mg  14 mg Transdermal Daily Clapacs, Madie Reno, MD       ondansetron (ZOFRAN-ODT) disintegrating tablet 4 mg  4 mg Oral Q6H PRN Waldon Merl F, NP       risperiDONE (RISPERDAL) tablet 0.5 mg  0.5 mg Oral Daily Rulon Sera, MD   0.5 mg at 01/29/22 1213   thiamine (VITAMIN B1) tablet 100 mg  100 mg Oral Daily Waldon Merl F, NP   100 mg at 01/29/22 0845   traZODone (DESYREL) tablet 100 mg  100 mg Oral QHS Rulon Sera, MD        Lab Results: No results found for this or any previous visit (from the past 48 hour(s)).  Blood Alcohol level:  Lab Results  Component Value Date   ETH <10 82/99/3716    Metabolic Disorder Labs: No results found for: "HGBA1C", "MPG" No results found for: "PROLACTIN" No results found for: "CHOL", "TRIG", "HDL", "CHOLHDL", "VLDL", "LDLCALC"      Psychiatric Specialty Exam:   Presentation  General Appearance:  Appropriate for Environment   Eye Contact: Good   Speech: Clear and Coherent   Speech Volume: Normal   Handedness: Right     Mood and Affect  Mood: Depressed   Affect: restricted     Thought Process   Thought Processes: Coherent   Past Diagnosis of Schizophrenia or Psychoactive disorder: No data recorded Descriptions of Associations:Intact   Orientation:Full (Time, Place and Person)   Thought Content:WDL   Passive S/I.    Sensorium  Memory: Immediate Good   Judgment: Fair   Insight: Fair     Materials engineer: Fair   Attention Span: Fair  Recall: Smiley Houseman of Knowledge: Fair   Language: Fair     Psychomotor Activity  Psychomotor Activity:No data recorded   Assets  Assets: Communication Skills; Desire for Improvement; Financial Resources/Insurance; Housing; Resilience     Physical Exam: Physical Exam ROS   Treatment Plan Summary: Daily contact with patient to assess and evaluate symptoms and progress in treatment and Medication management   Observation Level/Precautions:  Continuous Observation  Laboratory:    Psychotherapy:    Medications:    Consultations:    Discharge Concerns:    Estimated LOS:  Other:      Physician Treatment Plan for Primary Diagnosis: Depression Long Term Goal(s): Improvement in symptoms so as ready for discharge   Short Term Goals: Ability to identify changes in lifestyle to reduce recurrence of condition will improve and Ability to verbalize feelings will improve   Physician Treatment Plan for Secondary Diagnosis: Principal Problem:   Depression   Long Term Goal(s): Improvement in symptoms so as ready for discharge   Short Term Goals: Ability to verbalize feelings will improve   12/23 Start Lexapro 10 mg p.o. daily Start low-dose Risperdal 0.5 mg p.o. daily.  Risk-benefit side effects and alternatives to EPS NMS weight gain and sedation Add trazodone 50 mg p.o. nightly Hemoglobin A1c was 6.2 in November 2023 Labs and status reviewed Continue CIWA protocol with Ativan  Physical Exam: Physical Exam ROS Blood pressure (!) 112/90, pulse 76, temperature 97.7 F (36.5 C), temperature  source Oral, resp. rate 18, height '5\' 7"'$  (1.702 m), weight 78.7 kg, SpO2 98 %. Body mass index is 27.17 kg/m.   Treatment Plan Summary: 12/24 Discontinue CIWA protocol Increase trazodone to 50 mg nightly  12/23 Start Lexapro 10 mg p.o. daily Start low-dose Risperdal 0.5 mg p.o. daily.  Risk-benefit side effects and alternatives to EPS NMS weight gain and sedation Add trazodone 50 mg p.o. nightly Hemoglobin A1c was 6.2 in November 2023 Labs and status reviewed Continue CIWA protocol with Ativan  Rulon Sera, MD 01/30/2022, 8:08 AM

## 2022-01-31 DIAGNOSIS — F332 Major depressive disorder, recurrent severe without psychotic features: Secondary | ICD-10-CM | POA: Diagnosis not present

## 2022-01-31 MED ORDER — TRAZODONE HCL 50 MG PO TABS
150.0000 mg | ORAL_TABLET | Freq: Every day | ORAL | Status: DC
  Administered 2022-01-31 – 2022-02-01 (×2): 150 mg via ORAL
  Filled 2022-01-31 (×2): qty 1

## 2022-01-31 MED ORDER — RISPERIDONE 1 MG PO TABS: 1.0000 mg | ORAL_TABLET | Freq: Every day | ORAL | Status: DC

## 2022-01-31 MED ORDER — RISPERIDONE 1 MG PO TABS
1.0000 mg | ORAL_TABLET | Freq: Every day | ORAL | Status: DC
  Administered 2022-01-31 – 2022-02-01 (×2): 1 mg via ORAL
  Filled 2022-01-31 (×2): qty 1

## 2022-01-31 NOTE — [facility] Suicide Risk Assessment (Signed)
[place] INPATIENT:  Family/Significant Other Suicide Prevention Education  Suicide Prevention Education:  Contact Attempts: April Summers, [phone] ,significant other  has been identified by the patient as the family member/significant other with whom the patient will be residing, and identified as the person(s) who will aid the patient in the event of a mental health crisis.  With written consent from the patient, two attempts were made to provide suicide prevention education, prior to and/or following the patient's discharge.  We were unsuccessful in providing suicide prevention education.  A suicide education pamphlet was given to the patient to share with family/significant other.  Date and time of first attempt:01/31/22/11:25 AM Date and time of second attempt:  Andrew Peters 01/31/2022, 11:25 AM

## 2022-01-31 NOTE — Plan of Care (Signed)
Patient is alert but guarded on assessment.  Patient is medication compliant.  Patient denies SI/HI and AVH.  Patient also denies anxiety and depression but isolates himself to room and has slept most of shift.  Patient denies pain.  Patient can contract for safety.  Q 15 minute rounds in progress, will continue to monitor. Problem: Education: Goal: Knowledge of General Education information will improve Description: Including pain rating scale, medication(s)/side effects and non-pharmacologic comfort measures Outcome: Progressing   Problem: Coping: Goal: Level of anxiety will decrease Outcome: Progressing   Problem: Pain Managment: Goal: General experience of comfort will improve Outcome: Progressing   Problem: Safety: Goal: Periods of time without injury will increase Outcome: Progressing   Problem: Activity: Goal: Interest or engagement in activities will improve Outcome: Not Progressing   Problem: Activity: Goal: Interest or engagement in leisure activities will improve Outcome: Not Progressing Goal: Imbalance in normal sleep/wake cycle will improve Outcome: Not Progressing

## 2022-01-31 NOTE — Plan of Care (Signed)
  Problem: Education: Goal: Knowledge of General Education information will improve Description: Including pain rating scale, medication(s)/side effects and non-pharmacologic comfort measures Outcome: Progressing   Problem: Health Behavior/Discharge Planning: Goal: Ability to manage health-related needs will improve Outcome: Progressing   Problem: Clinical Measurements: Goal: Ability to maintain clinical measurements within normal limits will improve Outcome: Progressing Goal: Will remain free from infection Outcome: Progressing Goal: Diagnostic test results will improve Outcome: Progressing Goal: Respiratory complications will improve Outcome: Progressing Goal: Cardiovascular complication will be avoided Outcome: Progressing   Problem: Activity: Goal: Risk for activity intolerance will decrease Outcome: Progressing   Problem: Nutrition: Goal: Adequate nutrition will be maintained Outcome: Progressing   Problem: Coping: Goal: Level of anxiety will decrease Outcome: Progressing   Problem: Elimination: Goal: Will not experience complications related to bowel motility Outcome: Progressing Goal: Will not experience complications related to urinary retention Outcome: Progressing   Problem: Pain Managment: Goal: General experience of comfort will improve Outcome: Progressing   Problem: Safety: Goal: Ability to remain free from injury will improve Outcome: Progressing   Problem: Skin Integrity: Goal: Risk for impaired skin integrity will decrease Outcome: Progressing   Problem: Education: Goal: Knowledge of Friend General Education information/materials will improve Outcome: Progressing Goal: Emotional status will improve Outcome: Progressing Goal: Mental status will improve Outcome: Progressing Goal: Verbalization of understanding the information provided will improve Outcome: Progressing   Problem: Activity: Goal: Interest or engagement in activities will  improve Outcome: Progressing Goal: Sleeping patterns will improve Outcome: Progressing   Problem: Coping: Goal: Ability to verbalize frustrations and anger appropriately will improve Outcome: Progressing Goal: Ability to demonstrate self-control will improve Outcome: Progressing   Problem: Health Behavior/Discharge Planning: Goal: Identification of resources available to assist in meeting health care needs will improve Outcome: Progressing Goal: Compliance with treatment plan for underlying cause of condition will improve Outcome: Progressing   Problem: Physical Regulation: Goal: Ability to maintain clinical measurements within normal limits will improve Outcome: Progressing   Problem: Safety: Goal: Periods of time without injury will increase Outcome: Progressing   Problem: Education: Goal: Ability to state activities that reduce stress will improve Outcome: Progressing   Problem: Coping: Goal: Ability to identify and develop effective coping behavior will improve Outcome: Progressing   Problem: Self-Concept: Goal: Ability to identify factors that promote anxiety will improve Outcome: Progressing Goal: Level of anxiety will decrease Outcome: Progressing Goal: Ability to modify response to factors that promote anxiety will improve Outcome: Progressing   Problem: Education: Goal: Utilization of techniques to improve thought processes will improve Outcome: Progressing Goal: Knowledge of the prescribed therapeutic regimen will improve Outcome: Progressing   Problem: Activity: Goal: Interest or engagement in leisure activities will improve Outcome: Progressing Goal: Imbalance in normal sleep/wake cycle will improve Outcome: Progressing   Problem: Coping: Goal: Coping ability will improve Outcome: Progressing Goal: Will verbalize feelings Outcome: Progressing   Problem: Health Behavior/Discharge Planning: Goal: Ability to make decisions will improve Outcome:  Progressing Goal: Compliance with therapeutic regimen will improve Outcome: Progressing   Problem: Role Relationship: Goal: Will demonstrate positive changes in social behaviors and relationships Outcome: Progressing   Problem: Safety: Goal: Ability to disclose and discuss suicidal ideas will improve Outcome: Progressing Goal: Ability to identify and utilize support systems that promote safety will improve Outcome: Progressing   Problem: Self-Concept: Goal: Will verbalize positive feelings about self Outcome: Progressing Goal: Level of anxiety will decrease Outcome: Progressing

## 2022-01-31 NOTE — BH IP Treatment Plan (Signed)
Interdisciplinary Treatment and Diagnostic Plan Update  01/31/2022 Time of Session: [place] MRN: [id number]  Principal Diagnosis: Depression  Secondary Diagnoses: Principal Problem:   Depression   Current Medications:  Current Facility-Administered Medications  Medication Dose Route Frequency Provider Last Rate Last Admin   acetaminophen (TYLENOL) tablet 650 mg  650 mg Oral Q6H PRN Sherlon Handing, NP       alum & mag hydroxide-simeth (MAALOX/MYLANTA) 200-200-20 MG/5ML suspension 30 mL  30 mL Oral Q4H PRN Sherlon Handing, NP       escitalopram (LEXAPRO) tablet 10 mg  10 mg Oral Daily Rulon Sera, MD   10 mg at 01/31/22 5176   hydrOXYzine (ATARAX) tablet 25 mg  25 mg Oral Q6H PRN Sherlon Handing, NP       levETIRAcetam (KEPPRA) tablet 500 mg  500 mg Oral BID Rulon Sera, MD   500 mg at 01/31/22 1607   loperamide (IMODIUM) capsule 2-4 mg  2-4 mg Oral PRN Sherlon Handing, NP       magnesium hydroxide (MILK OF MAGNESIA) suspension 30 mL  30 mL Oral Daily PRN Waldon Merl F, NP       nicotine (NICODERM CQ - dosed in mg/24 hours) patch 14 mg  14 mg Transdermal Daily Clapacs, Madie Reno, MD       ondansetron (ZOFRAN-ODT) disintegrating tablet 4 mg  4 mg Oral Q6H PRN Sherlon Handing, NP       risperiDONE (RISPERDAL) tablet 1 mg  1 mg Oral QHS Rulon Sera, MD       traZODone (DESYREL) tablet 150 mg  150 mg Oral QHS Rulon Sera, MD       PTA Medications: Medications Prior to Admission  Medication Sig Dispense Refill Last Dose   benzonatate (TESSALON PERLES) 100 MG capsule Take 1-2 tabs TID prn cough (Patient not taking: Reported on 01/27/2022) 30 capsule 0    brompheniramine-pseudoephedrine-DM 30-2-10 MG/5ML syrup Take 5 mLs by mouth 4 (four) times daily as needed. (Patient not taking: Reported on 01/27/2022) 120 mL 0    chlorthalidone (HYGROTON) 25 MG tablet Take 25 mg by mouth daily.      ibuprofen (ADVIL) 600 MG tablet Take 1 tablet (600 mg total) by  mouth every 8 (eight) hours as needed. (Patient not taking: Reported on 01/27/2022) 15 tablet 0    levETIRAcetam (KEPPRA) 500 MG tablet Take 1 tablet (500 mg total) by mouth 2 (two) times daily. 60 tablet 1    lovastatin (MEVACOR) 20 MG tablet Take 20 mg by mouth at bedtime.      ondansetron (ZOFRAN-ODT) 4 MG disintegrating tablet Take 1 tablet (4 mg total) by mouth every 8 (eight) hours as needed. (Patient not taking: Reported on 01/27/2022) 20 tablet 0    pantoprazole (PROTONIX) 40 MG tablet Take 1 tablet (40 mg total) by mouth daily for 14 days. 14 tablet 0    promethazine (PHENERGAN) 25 MG suppository Place 1 suppository (25 mg total) rectally every 6 (six) hours as needed for nausea or vomiting. (Patient not taking: Reported on 01/27/2022) 12 each 0    tamsulosin (FLOMAX) 0.4 MG CAPS capsule Take 1 capsule (0.4 mg total) by mouth daily. 90 capsule 3     Patient Stressors: Loss of father and brother within the last year   Marital or family conflict   Medication change or noncompliance   Substance abuse   Other: depression    Patient Strengths: Capable of independent living  Hydrographic surveyor  for treatment/growth  Physical Health   Treatment Modalities: Medication Management, Group therapy, Case management,  1 to 1 session with clinician, Psychoeducation, Recreational therapy.   Physician Treatment Plan for Primary Diagnosis: Depression Long Term Goal(s): Improvement in symptoms so as ready for discharge   Short Term Goals: Ability to verbalize feelings will improve Ability to identify changes in lifestyle to reduce recurrence of condition will improve  Medication Management: Evaluate patient's response, side effects, and tolerance of medication regimen.  Therapeutic Interventions: 1 to 1 sessions, Unit Group sessions and Medication administration.  Evaluation of Outcomes: Not Met  Physician Treatment Plan for Secondary Diagnosis: Principal Problem:    Depression  Long Term Goal(s): Improvement in symptoms so as ready for discharge   Short Term Goals: Ability to verbalize feelings will improve Ability to identify changes in lifestyle to reduce recurrence of condition will improve     Medication Management: Evaluate patient's response, side effects, and tolerance of medication regimen.  Therapeutic Interventions: 1 to 1 sessions, Unit Group sessions and Medication administration.  Evaluation of Outcomes: Not Met   RN Treatment Plan for Primary Diagnosis: Depression Long Term Goal(s): Knowledge of disease and therapeutic regimen to maintain health will improve  Short Term Goals: Ability to remain free from injury will improve, Ability to verbalize frustration and anger appropriately will improve, Ability to demonstrate self-control, Ability to participate in decision making will improve, Ability to verbalize feelings will improve, Ability to disclose and discuss suicidal ideas, Ability to identify and develop effective coping behaviors will improve, and Compliance with prescribed medications will improve  Medication Management: RN will administer medications as ordered by provider, will assess and evaluate patient's response and provide education to patient for prescribed medication. RN will report any adverse and/or side effects to prescribing provider.  Therapeutic Interventions: 1 on 1 counseling sessions, Psychoeducation, Medication administration, Evaluate responses to treatment, Monitor vital signs and CBGs as ordered, Perform/monitor CIWA, COWS, AIMS and Fall Risk screenings as ordered, Perform wound care treatments as ordered.  Evaluation of Outcomes: Not Met   LCSW Treatment Plan for Primary Diagnosis: Depression Long Term Goal(s): Safe transition to appropriate next level of care at discharge, Engage patient in therapeutic group addressing interpersonal concerns.  Short Term Goals: Engage patient in aftercare planning with  referrals and resources, Increase social support, Increase ability to appropriately verbalize feelings, Increase emotional regulation, Facilitate acceptance of mental health diagnosis and concerns, Facilitate patient progression through stages of change regarding substance use diagnoses and concerns, Identify triggers associated with mental health/substance abuse issues, and Increase skills for wellness and recovery  Therapeutic Interventions: Assess for all discharge needs, 1 to 1 time with Social worker, Explore available resources and support systems, Assess for adequacy in community support network, Educate family and significant other(s) on suicide prevention, Complete Psychosocial Assessment, Interpersonal group therapy.  Evaluation of Outcomes: Not Met   Progress in Treatment: Attending groups: No. Participating in groups: No. Taking medication as prescribed: Yes. Toleration medication: Yes. Family/Significant other contact made: No, will contact:  when given permission Patient understands diagnosis: Yes. Discussing patient identified problems/goals with staff: Yes. Medical problems stabilized or resolved: Yes. Denies suicidal/homicidal ideation: Yes. Issues/concerns per patient self-inventory: No. Other: None  New problem(s) identified: No, Describe:  None  New Short Term/Long Term Goal(s): Patient to work towards detox, elimination of symptoms of psychosis, medication management for mood stabilization; elimination of SI thoughts; development of comprehensive mental wellness plan.   Patient Goals:  " outpatient substance use treatment, get  better and go home"  Discharge Plan or Barriers: CSW will assist pt with development of appropriate discharge/aftercare plan  Reason for Continuation of Hospitalization: Depression Hallucinations  Estimated Length of Stay: 1-7 days  Last 3 [place] Suicide Severity Risk Score: Flowsheet Row Admission (Current) from 01/28/2022 in [place] ED from 01/27/2022 in [place] ED from 08/19/2021 in [place] No Risk No Risk No Risk       Last PHQ 2/9 Scores:     No data to display          Scribe for Treatment Team: Samin Milke A [place], [place] 01/31/2022 10:59 AM

## 2022-01-31 NOTE — Progress Notes (Addendum)
[facility] MD Progress Note  01/31/2022 8:13 AM Andrew Peters  MRN:  [id number] Subjective:   12/25 Patient reports gradual improvement in his mood.  No side effects on his medication.  No new medical problems.  Denies have any cravings for alcohol, no symptoms of alcohol withdrawal.  CIWA protocol was discontinued yesterday.  He is open to trying a medication such as naltrexone.  He is also open to outpatient since abuse treatment in addition to psychiatric services and therapy..  Denies having any safety concerns.  This morning he heard a whistle in his room but has not experienced any voices or visual hallucinations, or even sounds other than this.    In regard to sleep, he slept from 830 to 2 AM which is an improvement.  He did have some nightmares which he usually does not have.  Regressed an increase in trazodone.  Good visit with his girlfriend yesterday.  12/24 Patient has been appropriate on the unit, selectively social.  He reports ongoing depression in addition to feelings of hopelessness nests helplessness and worthlessness.  On a positive note, the states that he slept much better than he usually does but woke up around 2 AM and because of a another patient yelling on the unit around that time.  No side effects on his medications.  No new medical problems.  Denies symptoms of alcohol withdrawal.  Reflects that he had been using a lot of drugs particularly crack cocaine in the late 90s in [place] and so they may have contributed to the hallucinations.  Notes that the auditory hallucinations he experiences are an unfamiliar voice and occasionally command, recently telling him to "go outside."  Denies suicidal or homicidal thoughts.  Looks forward to his girlfriend coming to visit him on the unit.  Last use alcohol approximately 6 days ago.  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)     Past Surgical History:  Procedure Laterality Date   COLONOSCOPY WITH PROPOFOL N/A 11/13/2014   Procedure: COLONOSCOPY WITH PROPOFOL;  Surgeon: Hulen Luster, MD;  Location: [facility] ENDOSCOPY;  Service: Gastroenterology;  Laterality: N/A;   CYST EXCISION Left 04/26/2021   Procedure: CYST REMOVAL;  Surgeon: Herbert Pun, MD;  Location: [facility] ORS;  Service: General;  Laterality: Left;   ESOPHAGOGASTRODUODENOSCOPY (EGD) WITH PROPOFOL N/A 11/13/2014   Procedure: ESOPHAGOGASTRODUODENOSCOPY (EGD) WITH PROPOFOL;  Surgeon: Hulen Luster, MD;  Location: [facility] ENDOSCOPY;  Service: Gastroenterology;  Laterality: N/A;   STOMACH SURGERY     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   Family History: History reviewed. No pertinent family history. Social History:  Social History   Substance and Sexual Activity  Alcohol Use Yes   Alcohol/week: 42.0 standard drinks of alcohol   Types: 42 Cans of beer per week     Social History   Substance and Sexual Activity  Drug Use No    Social History   Socioeconomic History   Marital status: Divorced    Spouse name: Not on file   Number of children: Not on file   Years of education: Not on file   Highest education level: Not on file  Occupational History   Not on file  Tobacco Use   Smoking status: Some Days    Packs/day: 0.25  Types: Cigarettes   Smokeless tobacco: Never  Vaping Use   Vaping Use: Never used  Substance and Sexual Activity   Alcohol use: Yes    Alcohol/week: 42.0 standard drinks of alcohol    Types: 42 Cans of beer per week   Drug use: No   Sexual activity: Yes    Birth control/protection: Condom  Other Topics Concern   Not on file  Social History Narrative   Pt reports he has a girlfriend who lives with him in his apartment   Social Determinants of Health   Financial Resource Strain:  Not on file  Food Insecurity: No Food Insecurity (01/28/2022)   Hunger Vital Sign    Worried About Running Out of Food in the Last Year: Never true    Ran Out of Food in the Last Year: Never true  Transportation Needs: No Transportation Needs (01/28/2022)   PRAPARE - Hydrologist (Medical): No    Lack of Transportation (Non-Medical): No  Physical Activity: Not on file  Stress: Not on file  Social Connections: Not on file   Additional Social History:                         Sleep: Fair  Appetite:  Fair  Current Medications: Current Facility-Administered Medications  Medication Dose Route Frequency Provider Last Rate Last Admin   acetaminophen (TYLENOL) tablet 650 mg  650 mg Oral Q6H PRN Sherlon Handing, NP       alum & mag hydroxide-simeth (MAALOX/MYLANTA) 200-200-20 MG/5ML suspension 30 mL  30 mL Oral Q4H PRN Sherlon Handing, NP       escitalopram (LEXAPRO) tablet 10 mg  10 mg Oral Daily Rulon Sera, MD   10 mg at 01/30/22 1540   hydrOXYzine (ATARAX) tablet 25 mg  25 mg Oral Q6H PRN Sherlon Handing, NP       levETIRAcetam (KEPPRA) tablet 500 mg  500 mg Oral BID Rulon Sera, MD   500 mg at 01/30/22 1719   loperamide (IMODIUM) capsule 2-4 mg  2-4 mg Oral PRN Sherlon Handing, NP       magnesium hydroxide (MILK OF MAGNESIA) suspension 30 mL  30 mL Oral Daily PRN Waldon Merl F, NP       nicotine (NICODERM CQ - dosed in mg/24 hours) patch 14 mg  14 mg Transdermal Daily Clapacs, John T, MD       ondansetron (ZOFRAN-ODT) disintegrating tablet 4 mg  4 mg Oral Q6H PRN Sherlon Handing, NP       [START ON 02/01/2022] risperiDONE (RISPERDAL) tablet 1 mg  1 mg Oral Daily Rulon Sera, MD       traZODone (DESYREL) tablet 150 mg  150 mg Oral QHS Rulon Sera, MD        Lab Results: No results found for this or any previous visit (from the past 48 hour(s)).  Blood Alcohol level:  Lab Results  Component Value Date   ETH <10 08/67/6195     Metabolic Disorder Labs: No results found for: "HGBA1C", "MPG" No results found for: "PROLACTIN" No results found for: "CHOL", "TRIG", "HDL", "CHOLHDL", "VLDL", "LDLCALC"      Psychiatric Specialty Exam:   Presentation  General Appearance:  Appropriate for Environment   Eye Contact: Good   Speech: Clear and Coherent   Speech Volume: Normal   Handedness: Right     Mood and Affect  Mood: better   Affect: restricted  Thought Process  Thought Processes: Coherent  Descriptions of Associations:Intact   Orientation:Full (Time, Place and Person)   Thought Content:WDL   No  S/I.    Sensorium  Memory: Immediate Good   Judgment: Fair   Insight: Fair     Community education officer  Concentration: better   Attention Span: Fair   Recall: [employer] of Knowledge: Fair   Language: Good     Psychomotor Activity  Psychomotor Activity:No data recorded   Assets  Assets: Communication Skills; Desire for Improvement; Financial Resources/Insurance; Housing; Resilience     Physical Exam: Physical Exam ROS   Treatment Plan Summary: Daily contact with patient to assess and evaluate symptoms and progress in treatment and Medication management   Observation Level/Precautions:  Continuous Observation  Laboratory:    Psychotherapy:    Medications:    Consultations:    Discharge Concerns:    Estimated LOS:  Other:      Physician Treatment Plan for Primary Diagnosis: Depression Long Term Goal(s): Improvement in symptoms so as ready for discharge   Short Term Goals: Ability to identify changes in lifestyle to reduce recurrence of condition will improve and Ability to verbalize feelings will improve   Physician Treatment Plan for Secondary Diagnosis: Principal Problem:   Depression   Long Term Goal(s): Improvement in symptoms so as ready for discharge   Short Term Goals: Ability to verbalize feelings will improve   12/23 Start Lexapro 10  mg p.o. daily Start low-dose Risperdal 0.5 mg p.o. daily.  Risk-benefit side effects and alternatives to EPS NMS weight gain and sedation Add trazodone 50 mg p.o. nightly Hemoglobin A1c was 6.2 in November 2023 Labs and status reviewed Continue CIWA protocol with Ativan  Physical Exam: Physical Exam ROS Blood pressure 132/81, pulse 78, temperature 98.1 F (36.7 C), temperature source Oral, resp. rate 18, height '5\' 7"'$  (1.702 m), weight 78.7 kg, SpO2 99 %. Body mass index is 27.17 kg/m.   Treatment Plan Summary: 12/25 Increase Risperdal to 1 mg p.o. nightly Increase trazodone 150 mg Consider  naltrexone 50 mg p.o. daily day tomorrow.   Patient history as did an outpatient chemical dependency services  12/24 Discontinue CIWA protocol Increase trazodone to 50 mg nightly  12/23 Start Lexapro 10 mg p.o. daily Start low-dose Risperdal 0.5 mg p.o. daily.  Risk-benefit side effects and alternatives to EPS NMS weight gain and sedation Add trazodone 50 mg p.o. nightly Hemoglobin A1c was 6.2 in November 2023 Labs and status reviewed Continue CIWA protocol with Ativan  Rulon Sera, MD 01/31/2022, 8:13 AM

## 2022-01-31 NOTE — Progress Notes (Signed)
D- Patient alert and oriented x4. Affect bright/mood positive. Denies SI/HI/AVH. He denies pain. I am feeling good today and want to set up appointment with a therapist. He states "I want do anything and everything to help me meet my goals of going home and getting therapy". A- Scheduled medications administered to patient per MD orders. Support and encouragement provided.  Routine safety checks conducted every 15 minutes without incident.  Patient informed to notify staff with problems or concerns. R- No adverse drug reactions noted. Patient compliant with medications, treatment plan and participates in discharge planning. He is receptive, calm, and cooperative and interacts well with others on the unit.  Patient contracts for safety and remains safe on the unit at this time.

## 2022-02-01 DIAGNOSIS — F332 Major depressive disorder, recurrent severe without psychotic features: Secondary | ICD-10-CM | POA: Diagnosis not present

## 2022-02-01 MED ORDER — NALTREXONE HCL 50 MG PO TABS
50.0000 mg | ORAL_TABLET | Freq: Every day | ORAL | Status: DC
  Administered 2022-02-01: 50 mg via ORAL
  Filled 2022-02-01 (×2): qty 1

## 2022-02-01 NOTE — Progress Notes (Deleted)
D- Patient alert and oriented x4. Affect bright/mood positive. Denies SI/HI/ AVH. He denies pain. He states he is "ready to go home tomorrow".  A- Scheduled medications administered to patient, per MD orders. He began Naltrexone today for cravings. Support and encouragement provided.  Routine safety checks conducted every 15 minutes without incident.  Patient informed to notify staff with problems or concerns. R- No adverse drug reactions noted. Tolerating Naltrexone well. Patient contracts for safety at this time. Patient compliant with medications and treatment plan. Patient receptive, calm, and cooperative. Patient interacts well with others on the unit.  Patient remains safe at this time.

## 2022-02-01 NOTE — Plan of Care (Signed)
  Problem: Education: Goal: Knowledge of General Education information will improve Description: Including pain rating scale, medication(s)/side effects and non-pharmacologic comfort measures Outcome: Progressing   Problem: Health Behavior/Discharge Planning: Goal: Ability to manage health-related needs will improve Outcome: Progressing   Problem: Clinical Measurements: Goal: Ability to maintain clinical measurements within normal limits will improve Outcome: Progressing Goal: Will remain free from infection Outcome: Progressing Goal: Diagnostic test results will improve Outcome: Progressing Goal: Respiratory complications will improve Outcome: Progressing Goal: Cardiovascular complication will be avoided Outcome: Progressing   Problem: Activity: Goal: Risk for activity intolerance will decrease Outcome: Progressing   Problem: Nutrition: Goal: Adequate nutrition will be maintained Outcome: Progressing   Problem: Coping: Goal: Level of anxiety will decrease Outcome: Progressing   Problem: Elimination: Goal: Will not experience complications related to bowel motility Outcome: Progressing Goal: Will not experience complications related to urinary retention Outcome: Progressing   Problem: Pain Managment: Goal: General experience of comfort will improve Outcome: Progressing   Problem: Safety: Goal: Ability to remain free from injury will improve Outcome: Progressing   Problem: Skin Integrity: Goal: Risk for impaired skin integrity will decrease Outcome: Progressing   Problem: Education: Goal: Knowledge of [place] [facility] Education information/materials will improve Outcome: Progressing Goal: Emotional status will improve Outcome: Progressing Goal: Mental status will improve Outcome: Progressing Goal: Verbalization of understanding the information provided will improve Outcome: Progressing   Problem: Activity: Goal: Interest or engagement in activities will  improve Outcome: Progressing Goal: Sleeping patterns will improve Outcome: Progressing   Problem: Coping: Goal: Ability to verbalize frustrations and anger appropriately will improve Outcome: Progressing Goal: Ability to demonstrate self-control will improve Outcome: Progressing   Problem: Health Behavior/Discharge Planning: Goal: Identification of resources available to assist in meeting health care needs will improve Outcome: Progressing Goal: Compliance with treatment plan for underlying cause of condition will improve Outcome: Progressing   Problem: Physical Regulation: Goal: Ability to maintain clinical measurements within normal limits will improve Outcome: Progressing   Problem: Safety: Goal: Periods of time without injury will increase Outcome: Progressing   Problem: Education: Goal: Ability to state activities that reduce stress will improve Outcome: Progressing   Problem: Coping: Goal: Ability to identify and develop effective coping behavior will improve Outcome: Progressing   Problem: Self-Concept: Goal: Ability to identify factors that promote anxiety will improve Outcome: Progressing Goal: Level of anxiety will decrease Outcome: Progressing Goal: Ability to modify response to factors that promote anxiety will improve Outcome: Progressing   Problem: Education: Goal: Utilization of techniques to improve thought processes will improve Outcome: Progressing Goal: Knowledge of the prescribed therapeutic regimen will improve Outcome: Progressing   Problem: Activity: Goal: Interest or engagement in leisure activities will improve Outcome: Progressing Goal: Imbalance in normal sleep/wake cycle will improve Outcome: Progressing   Problem: Coping: Goal: Coping ability will improve Outcome: Progressing Goal: Will verbalize feelings Outcome: Progressing   Problem: Health Behavior/Discharge Planning: Goal: Ability to make decisions will improve Outcome:  Progressing Goal: Compliance with therapeutic regimen will improve Outcome: Progressing   Problem: Role Relationship: Goal: Will demonstrate positive changes in social behaviors and relationships Outcome: Progressing   Problem: Safety: Goal: Ability to disclose and discuss suicidal ideas will improve Outcome: Progressing Goal: Ability to identify and utilize support systems that promote safety will improve Outcome: Progressing   Problem: Self-Concept: Goal: Will verbalize positive feelings about self Outcome: Progressing Goal: Level of anxiety will decrease Outcome: Progressing

## 2022-02-01 NOTE — Progress Notes (Signed)
Recreation Therapy Notes  INPATIENT RECREATION THERAPY ASSESSMENT  Patient Details Name: Andrew Peters MRN: [id number] DOB: October 09, 1965 Today's Date: 02/01/2022       Information Obtained From: Patient  Able to Participate in Assessment/Interview: Yes  Patient Presentation: Responsive  Reason for Admission (Per Patient): Active Symptoms  Patient Stressors:    Coping Skills:   Music, TV, Substance Abuse, Talk  Leisure Interests (2+):  Music - Listen, Sports - Football, Sports - Dance, Sports - [employer], Sports - Basketball, Individual - TV (Skating)  Frequency of Recreation/Participation: Biomedical engineer of Community Resources:  Yes  Community Resources:  [employer], [employer], Tax inspector, Patent examiner, Engineer, drilling  Current Use:    If no, Barriers?: Transportation, Museum/gallery curator  Expressed Interest in [place]: Yes  [employer] of Residence:  Insurance underwriter  Patient Main Form of Transportation: Walk  Patient Strengths:  Outgoing, kind, respectful, helpful  Patient Identified Areas of Improvement:  my temper  Patient Goal for Hospitalization:  Get back on my medication and get outpatient  Current SI (including self-harm):  No  Current HI:  No  Current AVH: No  Staff Intervention Plan: Group Attendance, Collaborate with Interdisciplinary Treatment Team  Consent to Intern Participation: N/A  Sahira Cataldi 02/01/2022, 11:44 AM

## 2022-02-01 NOTE — Progress Notes (Signed)
[facility] MD Progress Note  02/01/2022 8:10 AM Andrew Peters  MRN:  [id number] Subjective:   12/26 Patient reports gradual improvement meant and mood.  He was glad to report that he slept 9 hours, better than he has in a very long time.  He showered.  He exercise.  Eating well.  No side effects on the medication.  No medical problems.  Denies grades for alcohol but would like to go and start the naltrexone.  He would like to go into chemical dependency treatment on an outpatient basis.  12/25 Patient reports gradual improvement in his mood.  No side effects on his medication.  No new medical problems.  Denies have any cravings for alcohol, no symptoms of alcohol withdrawal.  CIWA protocol was discontinued yesterday.  He is open to trying a medication such as naltrexone.  He is also open to outpatient since abuse treatment in addition to psychiatric services and therapy..  Denies having any safety concerns.  This morning he heard a whistle in his room but has not experienced any voices or visual hallucinations, or even sounds other than this.    In regard to sleep, he slept from 830 to 2 AM which is an improvement.  He did have some nightmares which he usually does not have.  Regressed an increase in trazodone.  Good visit with his girlfriend yesterday.  12/24 Patient has been appropriate on the unit, selectively social.  He reports ongoing depression in addition to feelings of hopelessness nests helplessness and worthlessness.  On a positive note, the states that he slept much better than he usually does but woke up around 2 AM and because of a another patient yelling on the unit around that time.  No side effects on his medications.  No new medical problems.  Denies symptoms of alcohol withdrawal.  Reflects that he had been using a lot of drugs particularly crack cocaine in the late 90s in [place] and so they may have contributed to the hallucinations.  Notes that the auditory  hallucinations he experiences are an unfamiliar voice and occasionally command, recently telling him to "go outside."  Denies suicidal or homicidal thoughts.  Looks forward to his girlfriend coming to visit him on the unit.  Last use alcohol approximately 6 days ago.  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   Paranoid schizophrenia ([place])     Past Surgical History:  Procedure Laterality Date   COLONOSCOPY WITH PROPOFOL N/A 11/13/2014   Procedure: COLONOSCOPY WITH PROPOFOL;  Surgeon: Hulen Luster, MD;  Location: [facility] ENDOSCOPY;  Service: Gastroenterology;  Laterality: N/A;   CYST EXCISION Left 04/26/2021   Procedure: CYST REMOVAL;  Surgeon: Herbert Pun, MD;  Location: [facility] ORS;  Service: General;  Laterality: Left;   ESOPHAGOGASTRODUODENOSCOPY (EGD) WITH PROPOFOL N/A 11/13/2014   Procedure: ESOPHAGOGASTRODUODENOSCOPY (EGD) WITH PROPOFOL;  Surgeon: Hulen Luster, MD;  Location: [facility] ENDOSCOPY;  Service: Gastroenterology;  Laterality: N/A;   STOMACH SURGERY     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   Family History: History reviewed. No pertinent family history. Social History:  Social History   Substance and Sexual Activity  Alcohol Use Yes   Alcohol/week: 42.0 standard drinks of alcohol   Types: 42 Cans of beer per week     Social History  Substance and Sexual Activity  Drug Use No    Social History   Socioeconomic History   Marital status: Divorced    Spouse name: Not on file   Number of children: Not on file   Years of education: Not on file   Highest education level: Not on file  Occupational History   Not on file  Tobacco Use   Smoking status: Some Days    Packs/day: 0.25    Types: Cigarettes   Smokeless tobacco: Never  Vaping Use   Vaping Use: Never used  Substance  and Sexual Activity   Alcohol use: Yes    Alcohol/week: 42.0 standard drinks of alcohol    Types: 42 Cans of beer per week   Drug use: No   Sexual activity: Yes    Birth control/protection: Condom  Other Topics Concern   Not on file  Social History Narrative   Pt reports he has a girlfriend who lives with him in his apartment   Social Determinants of Health   Financial Resource Strain: Not on file  Food Insecurity: No Food Insecurity (01/28/2022)   Hunger Vital Sign    Worried About Running Out of Food in the Last Year: Never true    Ran Out of Food in the Last Year: Never true  Transportation Needs: No Transportation Needs (01/28/2022)   PRAPARE - Hydrologist (Medical): No    Lack of Transportation (Non-Medical): No  Physical Activity: Not on file  Stress: Not on file  Social Connections: Not on file   Additional Social History:                         Sleep: Fair  Appetite:  Fair  Current Medications: Current Facility-Administered Medications  Medication Dose Route Frequency Provider Last Rate Last Admin   acetaminophen (TYLENOL) tablet 650 mg  650 mg Oral Q6H PRN Sherlon Handing, NP       alum & mag hydroxide-simeth (MAALOX/MYLANTA) 200-200-20 MG/5ML suspension 30 mL  30 mL Oral Q4H PRN Waldon Merl F, NP       escitalopram (LEXAPRO) tablet 10 mg  10 mg Oral Daily Rulon Sera, MD   10 mg at 01/31/22 7353   levETIRAcetam (KEPPRA) tablet 500 mg  500 mg Oral BID Rulon Sera, MD   500 mg at 01/31/22 1653   magnesium hydroxide (MILK OF MAGNESIA) suspension 30 mL  30 mL Oral Daily PRN Waldon Merl F, NP       nicotine (NICODERM CQ - dosed in mg/24 hours) patch 14 mg  14 mg Transdermal Daily Clapacs, John T, MD       risperiDONE (RISPERDAL) tablet 1 mg  1 mg Oral QHS Rulon Sera, MD   1 mg at 01/31/22 2146   traZODone (DESYREL) tablet 150 mg  150 mg Oral QHS Rulon Sera, MD   150 mg at 01/31/22 2146    Lab Results: No  results found for this or any previous visit (from the past 24 hour(s)).  Blood Alcohol level:  Lab Results  Component Value Date   ETH <10 29/92/4268    Metabolic Disorder Labs: No results found for: "HGBA1C", "MPG" No results found for: "PROLACTIN" No results found for: "CHOL", "TRIG", "HDL", "CHOLHDL", "VLDL", "[place]"      Psychiatric Specialty Exam:   Presentation  General Appearance:  Appropriate for Environment   Eye Contact: Good   Speech: Clear and Coherent   Speech Volume:  Normal   Handedness: Right     Mood and Affect  Mood: down   Affect: neutral     Thought Process  Thought Processes: Coherent  Descriptions of Associations:Intact   Orientation:Full (Time, Place and Person)   Thought Content:WDL   No  S/I.    Sensorium  Memory: Immediate Good   Judgment: Fair   Insight: Fair     Community education officer  Concentration: better   Attention Span: Fair   Recall: [employer] of Knowledge: Fair   Language: Good     Psychomotor Activity  Psychomotor Activity:No data recorded   Assets  Assets: Communication Skills; Desire for Improvement; Financial Resources/Insurance; Housing; Resilience     Physical Exam: Physical Exam ROS   Treatment Plan Summary: Daily contact with patient to assess and evaluate symptoms and progress in treatment and Medication management   Observation Level/Precautions:  Continuous Observation  Laboratory:    Psychotherapy:    Medications:    Consultations:    Discharge Concerns:    Estimated LOS:  Other:      Physician Treatment Plan for Primary Diagnosis: Depression Long Term Goal(s): Improvement in symptoms so as ready for discharge   Short Term Goals: Ability to identify changes in lifestyle to reduce recurrence of condition will improve and Ability to verbalize feelings will improve   Physician Treatment Plan for Secondary Diagnosis: Principal Problem:   Depression   Long Term  Goal(s): Improvement in symptoms so as ready for discharge   Short Term Goals: Ability to verbalize feelings will improve   12/23 Start Lexapro 10 mg p.o. daily Start low-dose Risperdal 0.5 mg p.o. daily.  Risk-benefit side effects and alternatives to EPS NMS weight gain and sedation Add trazodone 50 mg p.o. nightly Hemoglobin A1c was 6.2 in November 2023 Labs and status reviewed Continue CIWA protocol with Ativan  Physical Exam: Physical Exam ROS Blood pressure 109/76, pulse 88, temperature 97.8 F (36.6 C), temperature source Oral, resp. rate 18, height '5\' 7"'$  (1.702 m), weight 78.7 kg, SpO2 99 %. Body mass index is 27.17 kg/m.   Treatment Plan Summary: 12/26 Start naltrexone 50 mg p.o. daily risks and benefits side effects and alternatives reviewed LFTs within normal limits Likely discharge tomorrow  12/25 Increase Risperdal to 1 mg p.o. nightly Increase trazodone 150 mg Consider  naltrexone 50 mg p.o. daily day tomorrow.   Patient history as did an outpatient chemical dependency services  12/24 Discontinue CIWA protocol Increase trazodone to 50 mg nightly  12/23 Start Lexapro 10 mg p.o. daily Start low-dose Risperdal 0.5 mg p.o. daily.  Risk-benefit side effects and alternatives to EPS NMS weight gain and sedation Add trazodone 50 mg p.o. nightly Hemoglobin A1c was 6.2 in November 2023 Labs and status reviewed Continue CIWA protocol with Ativan  Rulon Sera, MD 02/01/2022, 8:10 AM

## 2022-02-01 NOTE — Progress Notes (Signed)
Recreation Therapy Notes    Date: 02/01/2022  Time: 10:25 am     Location: Craft room   Behavioral response: Appropriate  Intervention Topic:  Leisure   Discussion/Intervention:  Group content today was focused on leisure. The group defined what leisure is and some positive leisure activities they participate in. Individuals identified the difference between good and bad leisure. Participants expressed how they feel after participating in the leisure of their choice. The group discussed how they go about picking a leisure activity and if others are involved in their leisure activities. The patient stated how many leisure activities they have to choose from and reasons why it is important to have leisure time. Individuals participated in the intervention "Exploration of Leisure" where they had a chance to identify new leisure activities as well as benefits of leisure. Clinical Observations/Feedback: Patient came to group and identified music, skating, football, baseball, basketball and watching television as leisure activities he enjoys. Individual was social with staff while participating in the intervention.  Ladell Lea LRT/CTRS         Norfleet Capers 02/01/2022 11:38 AM

## 2022-02-01 NOTE — Progress Notes (Signed)
Recreation Therapy Notes  INPATIENT RECREATION TR PLAN  Patient Details Name: Andrew Peters MRN: [id number] DOB: [birth year] Today's Date: 02/01/2022  Rec Therapy Plan Is patient appropriate for Therapeutic Recreation?: Yes Treatment times per week: at least 3 Estimated Length of Stay: 5-7 days TR Treatment/Interventions: Group participation (Comment)  Discharge Criteria Pt will be discharged from therapy if:: Discharged Treatment plan/goals/alternatives discussed and agreed upon by:: Patient/family  Discharge Summary     Leyan Branden 02/01/2022, 11:44 AM

## 2022-02-01 NOTE — Progress Notes (Signed)
  [facility] Adult Case Management Discharge Plan :  Will you be returning to the same living situation after discharge:  Yes,  pt reports that he is returning home At discharge, do you have transportation home?: Yes,  pt reports that his girlfriend will provide transportation.  Do you have the ability to pay for your medications: Yes,  [place] [facility] [place]  Release of information consent forms completed and in the chart;  Patient's signature needed at discharge.  Patient to Follow up at:  Follow-up Information     [place] Follow up.   Why: Walk in appointments are Monday, Wednesday and Friday 8AM to 4:30PM.  Please arrive by 7:30 AM.  Appointments are first come, first serve. Contact information: [place] [address] [phone]                 Next level of care provider has access to [place] and Suicide Prevention discussed: Yes,  SPE completed with the patient.      Has patient been referred to the Quitline?: Patient refused referral  Patient has been referred for addiction treatment: Pt. refused referral  Rozann Lesches, LCSW 02/01/2022, 1:46 PM

## 2022-02-01 NOTE — Progress Notes (Incomplete)
D- Patient alert and oriented x4. Affect bright/mood positive. Denies SI/HI/ AVH. Denies pain. States he is "ready to go home tomorrow".  A- Scheduled medications administered to patient, per MD orders. Support and encouragement provided.  Routine safety checks conducted every 15 minutes without incident.  Patient informed to notify staff with problems or concerns. R- Patient complains of adverse drug reactions to Naltrexone. Staes he feels "dizzy and room spinning".  VS T=97.7 P=84 R=16 BP= 106/85. MD notified. Patient instructed to use call light if he needs to get up for any reason. He verbalizes understanding.  Patient compliant with other medications and treatment plan. Patient receptive, calm, and cooperative. Patient interacts well with others on the unit and remains safe on the unit at this time.

## 2022-02-01 NOTE — [facility] Group Notes (Signed)
[place] Group Notes:  (Nursing/MHT/Case Management/Adjunct)  Date:  02/01/2022  Time:  8:39 PM  Type of Therapy:   Wrap up  Participation Level:  Did Not Attend    Summary of Progress/Problems:  Andrew Peters 02/01/2022, 8:39 PM

## 2022-02-01 NOTE — Progress Notes (Signed)
Andrew Peters is resting in bed and states he is still feeling "dizzy, but ok". Note sent to Dr. Daneil Dolin. Instructed to use call light for assistance. He verbalizes understanding.

## 2022-02-01 NOTE — [facility] Suicide Risk Assessment (Signed)
[place] INPATIENT:  Family/Significant Other Suicide Prevention Education  Suicide Prevention Education:  Education Completed; girlfriend, Andrew Peters, [phone] has been identified by the patient as the family member/significant other with whom the patient will be residing, and identified as the person(s) who will aid the patient in the event of a mental health crisis (suicidal ideations/suicide attempt).  With written consent from the patient, the family member/significant other has been provided the following suicide prevention education, prior to the and/or following the discharge of the patient.  The suicide prevention education provided includes the following: Suicide risk factors Suicide prevention and interventions National Suicide Hotline telephone number [facility] assessment telephone number [facility] Emergency Assistance [place] and/or Residential Mobile Crisis Unit telephone number  Request made of family/significant other to: Remove weapons (e.g., guns, rifles, knives), all items previously/currently identified as safety concern.   Remove drugs/medications (over-the-counter, prescriptions, illicit drugs), all items previously/currently identified as a safety concern.  The family member/significant other verbalizes understanding of the suicide prevention education information provided.  The family member/significant other agrees to remove the items of safety concern listed above.  Andrew Peters 02/01/2022, 1:50 PM

## 2022-02-01 NOTE — Group Note (Signed)
LCSW Group Therapy Note  Group Date: 02/01/2022 Start Time: 1300 End Time: 1400   Type of Therapy and Topic:  Group Therapy - How To Cope with Nervousness about Discharge   Participation Level:  Active   Description of Group This process group involved identification of patients' feelings about discharge. Some of them are scheduled to be discharged soon, while others are new admissions, but each of them was asked to share thoughts and feelings surrounding discharge from the hospital. One common theme was that they are excited at the prospect of going home, while another was that many of them are apprehensive about sharing why they were hospitalized. Patients were given the opportunity to discuss these feelings with their peers in preparation for discharge.  Therapeutic Goals  Patient will identify their overall feelings about pending discharge. Patient will think about how they might proactively address issues that they believe will once again arise once they get home (i.e. with parents). Patients will participate in discussion about having hope for change.   Summary of Patient Progress:  Patient was very active throughout the session. Patient demonstrated fair insight into the subject matter.  Patient engaged in discussion on discharge planning.  Patient was appropriate and requested assistance with establishing an aftercare appointment.   Therapeutic Modalities Cognitive Behavioral Therapy   Maryjane Hurter 02/01/2022  1:29 PM

## 2022-02-02 DIAGNOSIS — F332 Major depressive disorder, recurrent severe without psychotic features: Secondary | ICD-10-CM | POA: Diagnosis not present

## 2022-02-02 MED ORDER — LEVETIRACETAM 500 MG PO TABS: 500.0000 mg | ORAL_TABLET | Freq: Two times a day (BID) | ORAL | 0 refills | Status: AC

## 2022-02-02 MED ORDER — ESCITALOPRAM OXALATE 10 MG PO TABS: 10.0000 mg | ORAL_TABLET | Freq: Every day | ORAL | 0 refills | Status: AC

## 2022-02-02 MED ORDER — NALTREXONE HCL 50 MG PO TABS: 25.0000 mg | ORAL_TABLET | Freq: Every day | ORAL | 0 refills | Status: AC

## 2022-02-02 MED ORDER — TRAZODONE HCL 150 MG PO TABS: 150.0000 mg | ORAL_TABLET | Freq: Every day | ORAL | 0 refills | Status: AC

## 2022-02-02 MED ORDER — RISPERIDONE 1 MG PO TABS: 1.0000 mg | ORAL_TABLET | Freq: Every day | ORAL | 0 refills | Status: AC

## 2022-02-02 MED ORDER — TAMSULOSIN HCL 0.4 MG PO CAPS: 0.4000 mg | ORAL_CAPSULE | Freq: Every day | ORAL | 0 refills | Status: AC

## 2022-02-02 MED ORDER — NALTREXONE HCL 50 MG PO TABS
25.0000 mg | ORAL_TABLET | Freq: Every day | ORAL | Status: DC
  Filled 2022-02-02: qty 1
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
## 2022-02-02 NOTE — Progress Notes (Signed)
Recreation Therapy Notes      Date: 02/02/2022   Time: 10:25 am   Location: Craft room      Behavioral response: N/A   Intervention Topic: Values    Discussion/Intervention: Patient refused to attend group.    Clinical Observations/Feedback:  Patient refused to attend group.    Chelsie Burel LRT/CTRS        Gaynor Genco 02/02/2022 11:06 AM

## 2022-02-02 NOTE — Progress Notes (Signed)
Recreation Therapy Notes  INPATIENT RECREATION TR PLAN  Patient Details Name: Andrew Peters MRN: [id number] DOB: 1965-06-18 Today's Date: 02/02/2022  Rec Therapy Plan Is patient appropriate for Therapeutic Recreation?: Yes Treatment times per week: at least 3 Estimated Length of Stay: 5-7 days TR Treatment/Interventions: Group participation (Comment)  Discharge Criteria Pt will be discharged from therapy if:: Discharged Treatment plan/goals/alternatives discussed and agreed upon by:: Patient/family  Discharge Summary Short term goals set: Patient will engage in groups without prompting or encouragement from LRT x3 group sessions within 5 recreation therapy group sessions Short term goals met: Adequate for discharge Progress toward goals comments: Groups attended Which groups?: Leisure education Reason goals not met: N/A Therapeutic equipment acquired: N/A Reason patient discharged from therapy: Discharge from hospital Pt/family agrees with progress & goals achieved: Yes Date patient discharged from therapy: 02/02/22   Saliyah Gillin 02/02/2022, 12:50 PM
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
## 2022-02-02 NOTE — Progress Notes (Signed)
Patient refused scheduled Naltrexone, stating that is makes him feel dizzy. This was also reported to this writer from the previous shift. MD will be notified.
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
## 2022-02-02 NOTE — Progress Notes (Signed)
D- Patient alert and oriented. Patient presents in a pleasant mood on assessment reporting that he slept good last night and had no complaints to voice to this Probation officer. Although patient denies signs/symptoms of depression and anxiety, he stated that he is anxious to go home. Patient also denies SI, HI, AVH, and pain at this time. Patient's goal for today is"getting out of here today", in which he will "pack my bag and wait for the doctor to come in', in order to achieve his goal.  A- Scheduled medications administered to patient, per MD orders. Support and encouragement provided. Routine safety checks conducted every 15 minutes. Patient informed to notify staff with problems or concerns.  R- No adverse drug reactions noted. Patient contracts for safety at this time. Patient compliant with medications and treatment plan. Patient receptive, calm, and cooperative. Patient interacts well with others on the unit. Patient remains safe at this time.

## 2022-02-02 NOTE — Plan of Care (Signed)
  Problem: Group Participation Goal: STG - Patient will engage in groups without prompting or encouragement from LRT x3 group sessions within 5 recreation therapy group sessions Description: STG - Patient will engage in groups without prompting or encouragement from LRT x3 group sessions within 5 recreation therapy group sessions 02/02/2022 1249 by Ernest Haber, LRT Outcome: Adequate for Discharge 02/02/2022 1249 by Ernest Haber, LRT Outcome: Adequate for Discharge

## 2022-02-02 NOTE — Progress Notes (Signed)
Patient ID: Andrew Peters, male   DOB: February 28, 1965, 55 y.o.   MRN: [id number]  Discharge Note:  Patient denies SI/HI/AVH at this time. Discharge instructions, AVS, prescriptions, and transition record gone over with patient. Patient agrees to comply with medication management, follow-up visit, and outpatient therapy. Patient belongings returned to patient. Patient questions and concerns addressed and answered. Patient ambulatory off unit. Patient discharged to home, via [place] services.

## 2022-02-02 NOTE — Plan of Care (Signed)
Patient is calm and cooperative, denies SI HI AVH, statess he is going home tomorrow. Pt states that he did not like his reaction to naltrexone and that he will let the MD know; pt felt extremely dizzy as side effect.  Pt states he does not want to take naltrexone.  Continued monitoring via q 15 m observations.

## 2022-03-21 DIAGNOSIS — Z0000 Encounter for general adult medical examination without abnormal findings: Secondary | ICD-10-CM | POA: Diagnosis not present

## 2022-03-21 DIAGNOSIS — R7303 Prediabetes: Secondary | ICD-10-CM | POA: Diagnosis not present

## 2022-03-21 DIAGNOSIS — Z125 Encounter for screening for malignant neoplasm of prostate: Secondary | ICD-10-CM | POA: Diagnosis not present

## 2022-03-21 DIAGNOSIS — I10 Essential (primary) hypertension: Secondary | ICD-10-CM | POA: Diagnosis not present

## 2022-03-21 DIAGNOSIS — Z720 Tobacco use: Secondary | ICD-10-CM | POA: Diagnosis not present

## 2022-03-21 DIAGNOSIS — E782 Mixed hyperlipidemia: Secondary | ICD-10-CM | POA: Diagnosis not present

## 2022-06-20 DIAGNOSIS — Z79899 Other long term (current) drug therapy: Secondary | ICD-10-CM | POA: Diagnosis not present

## 2022-06-29 DIAGNOSIS — Z79899 Other long term (current) drug therapy: Secondary | ICD-10-CM | POA: Diagnosis not present

## 2022-07-07 DIAGNOSIS — F251 Schizoaffective disorder, depressive type: Secondary | ICD-10-CM | POA: Diagnosis not present

## 2022-07-22 ENCOUNTER — Other Ambulatory Visit: Payer: Self-pay | Admitting: Physician Assistant

## 2022-07-28 DIAGNOSIS — F251 Schizoaffective disorder, depressive type: Secondary | ICD-10-CM | POA: Diagnosis not present

## 2022-08-04 DIAGNOSIS — F251 Schizoaffective disorder, depressive type: Secondary | ICD-10-CM | POA: Diagnosis not present

## 2022-08-10 DIAGNOSIS — Z79899 Other long term (current) drug therapy: Secondary | ICD-10-CM | POA: Diagnosis not present

## 2022-08-18 DIAGNOSIS — F251 Schizoaffective disorder, depressive type: Secondary | ICD-10-CM | POA: Diagnosis not present

## 2022-08-30 DIAGNOSIS — M545 Low back pain, unspecified: Secondary | ICD-10-CM | POA: Diagnosis not present

## 2022-08-31 DIAGNOSIS — M9901 Segmental and somatic dysfunction of cervical region: Secondary | ICD-10-CM | POA: Diagnosis not present

## 2022-08-31 DIAGNOSIS — M25432 Effusion, left wrist: Secondary | ICD-10-CM | POA: Diagnosis not present

## 2022-08-31 DIAGNOSIS — M25532 Pain in left wrist: Secondary | ICD-10-CM | POA: Diagnosis not present

## 2022-08-31 DIAGNOSIS — M9903 Segmental and somatic dysfunction of lumbar region: Secondary | ICD-10-CM | POA: Diagnosis not present

## 2022-08-31 DIAGNOSIS — M542 Cervicalgia: Secondary | ICD-10-CM | POA: Diagnosis not present

## 2022-08-31 DIAGNOSIS — M5412 Radiculopathy, cervical region: Secondary | ICD-10-CM | POA: Diagnosis not present

## 2022-09-01 DIAGNOSIS — M25532 Pain in left wrist: Secondary | ICD-10-CM | POA: Diagnosis not present

## 2022-09-01 DIAGNOSIS — M25432 Effusion, left wrist: Secondary | ICD-10-CM | POA: Diagnosis not present

## 2022-09-01 DIAGNOSIS — M9901 Segmental and somatic dysfunction of cervical region: Secondary | ICD-10-CM | POA: Diagnosis not present

## 2022-09-01 DIAGNOSIS — M5412 Radiculopathy, cervical region: Secondary | ICD-10-CM | POA: Diagnosis not present

## 2022-09-01 DIAGNOSIS — F251 Schizoaffective disorder, depressive type: Secondary | ICD-10-CM | POA: Diagnosis not present

## 2022-09-01 DIAGNOSIS — M542 Cervicalgia: Secondary | ICD-10-CM | POA: Diagnosis not present

## 2022-09-01 DIAGNOSIS — M9903 Segmental and somatic dysfunction of lumbar region: Secondary | ICD-10-CM | POA: Diagnosis not present

## 2022-09-02 DIAGNOSIS — M25532 Pain in left wrist: Secondary | ICD-10-CM | POA: Diagnosis not present

## 2022-09-02 DIAGNOSIS — M25432 Effusion, left wrist: Secondary | ICD-10-CM | POA: Diagnosis not present

## 2022-09-02 DIAGNOSIS — M9903 Segmental and somatic dysfunction of lumbar region: Secondary | ICD-10-CM | POA: Diagnosis not present

## 2022-09-02 DIAGNOSIS — M542 Cervicalgia: Secondary | ICD-10-CM | POA: Diagnosis not present

## 2022-09-02 DIAGNOSIS — M5412 Radiculopathy, cervical region: Secondary | ICD-10-CM | POA: Diagnosis not present

## 2022-09-02 DIAGNOSIS — M9901 Segmental and somatic dysfunction of cervical region: Secondary | ICD-10-CM | POA: Diagnosis not present

## 2022-09-06 DIAGNOSIS — M9901 Segmental and somatic dysfunction of cervical region: Secondary | ICD-10-CM | POA: Diagnosis not present

## 2022-09-06 DIAGNOSIS — M25532 Pain in left wrist: Secondary | ICD-10-CM | POA: Diagnosis not present

## 2022-09-06 DIAGNOSIS — M542 Cervicalgia: Secondary | ICD-10-CM | POA: Diagnosis not present

## 2022-09-06 DIAGNOSIS — M9903 Segmental and somatic dysfunction of lumbar region: Secondary | ICD-10-CM | POA: Diagnosis not present

## 2022-09-06 DIAGNOSIS — M5412 Radiculopathy, cervical region: Secondary | ICD-10-CM | POA: Diagnosis not present

## 2022-09-06 DIAGNOSIS — M25432 Effusion, left wrist: Secondary | ICD-10-CM | POA: Diagnosis not present

## 2022-09-07 DIAGNOSIS — M25532 Pain in left wrist: Secondary | ICD-10-CM | POA: Diagnosis not present

## 2022-09-07 DIAGNOSIS — M9901 Segmental and somatic dysfunction of cervical region: Secondary | ICD-10-CM | POA: Diagnosis not present

## 2022-09-07 DIAGNOSIS — M542 Cervicalgia: Secondary | ICD-10-CM | POA: Diagnosis not present

## 2022-09-07 DIAGNOSIS — M25432 Effusion, left wrist: Secondary | ICD-10-CM | POA: Diagnosis not present

## 2022-09-07 DIAGNOSIS — M9903 Segmental and somatic dysfunction of lumbar region: Secondary | ICD-10-CM | POA: Diagnosis not present

## 2022-09-07 DIAGNOSIS — M5412 Radiculopathy, cervical region: Secondary | ICD-10-CM | POA: Diagnosis not present

## 2022-09-08 DIAGNOSIS — M25432 Effusion, left wrist: Secondary | ICD-10-CM | POA: Diagnosis not present

## 2022-09-08 DIAGNOSIS — F251 Schizoaffective disorder, depressive type: Secondary | ICD-10-CM | POA: Diagnosis not present

## 2022-09-08 DIAGNOSIS — M542 Cervicalgia: Secondary | ICD-10-CM | POA: Diagnosis not present

## 2022-09-08 DIAGNOSIS — M9901 Segmental and somatic dysfunction of cervical region: Secondary | ICD-10-CM | POA: Diagnosis not present

## 2022-09-08 DIAGNOSIS — M25532 Pain in left wrist: Secondary | ICD-10-CM | POA: Diagnosis not present

## 2022-09-08 DIAGNOSIS — M9903 Segmental and somatic dysfunction of lumbar region: Secondary | ICD-10-CM | POA: Diagnosis not present

## 2022-09-08 DIAGNOSIS — M5412 Radiculopathy, cervical region: Secondary | ICD-10-CM | POA: Diagnosis not present

## 2022-09-12 DIAGNOSIS — M5412 Radiculopathy, cervical region: Secondary | ICD-10-CM | POA: Diagnosis not present

## 2022-09-12 DIAGNOSIS — M25432 Effusion, left wrist: Secondary | ICD-10-CM | POA: Diagnosis not present

## 2022-09-12 DIAGNOSIS — M9903 Segmental and somatic dysfunction of lumbar region: Secondary | ICD-10-CM | POA: Diagnosis not present

## 2022-09-12 DIAGNOSIS — M25532 Pain in left wrist: Secondary | ICD-10-CM | POA: Diagnosis not present

## 2022-09-12 DIAGNOSIS — M9901 Segmental and somatic dysfunction of cervical region: Secondary | ICD-10-CM | POA: Diagnosis not present

## 2022-09-12 DIAGNOSIS — M542 Cervicalgia: Secondary | ICD-10-CM | POA: Diagnosis not present

## 2022-09-14 DIAGNOSIS — M25532 Pain in left wrist: Secondary | ICD-10-CM | POA: Diagnosis not present

## 2022-09-14 DIAGNOSIS — M5412 Radiculopathy, cervical region: Secondary | ICD-10-CM | POA: Diagnosis not present

## 2022-09-14 DIAGNOSIS — M542 Cervicalgia: Secondary | ICD-10-CM | POA: Diagnosis not present

## 2022-09-14 DIAGNOSIS — M25432 Effusion, left wrist: Secondary | ICD-10-CM | POA: Diagnosis not present

## 2022-09-14 DIAGNOSIS — M9903 Segmental and somatic dysfunction of lumbar region: Secondary | ICD-10-CM | POA: Diagnosis not present

## 2022-09-14 DIAGNOSIS — M9901 Segmental and somatic dysfunction of cervical region: Secondary | ICD-10-CM | POA: Diagnosis not present

## 2022-09-19 DIAGNOSIS — Z79899 Other long term (current) drug therapy: Secondary | ICD-10-CM | POA: Diagnosis not present

## 2022-12-22 DIAGNOSIS — E782 Mixed hyperlipidemia: Secondary | ICD-10-CM | POA: Diagnosis not present

## 2022-12-22 DIAGNOSIS — R7303 Prediabetes: Secondary | ICD-10-CM | POA: Diagnosis not present

## 2024-02-03 ENCOUNTER — Emergency Department: Payer: MEDICAID

## 2024-02-03 ENCOUNTER — Other Ambulatory Visit: Payer: Self-pay

## 2024-02-03 DIAGNOSIS — X501XXA Overexertion from prolonged static or awkward postures, initial encounter: Secondary | ICD-10-CM | POA: Diagnosis not present

## 2024-02-03 DIAGNOSIS — Z85028 Personal history of other malignant neoplasm of stomach: Secondary | ICD-10-CM | POA: Diagnosis not present

## 2024-02-03 DIAGNOSIS — I10 Essential (primary) hypertension: Secondary | ICD-10-CM | POA: Insufficient documentation

## 2024-02-03 DIAGNOSIS — Y9351 Activity, roller skating (inline) and skateboarding: Secondary | ICD-10-CM | POA: Insufficient documentation

## 2024-02-03 DIAGNOSIS — M25532 Pain in left wrist: Secondary | ICD-10-CM | POA: Diagnosis present

## 2024-02-03 DIAGNOSIS — S63502A Unspecified sprain of left wrist, initial encounter: Secondary | ICD-10-CM | POA: Insufficient documentation

## 2024-02-03 NOTE — ED Triage Notes (Signed)
 Pt fell on out strectched arm while at the skating rink. Endorsing left wrist pain radiating down to the fingers. Denies forearm pain. PMS intact.

## 2024-02-04 ENCOUNTER — Emergency Department
Admission: EM | Admit: 2024-02-04 | Discharge: 2024-02-04 | Disposition: A | Discharge status: Home / Self Care | Payer: MEDICAID

## 2024-02-04 DIAGNOSIS — S63502A Unspecified sprain of left wrist, initial encounter: Secondary | ICD-10-CM

## 2024-02-04 MED ORDER — ACETAMINOPHEN 500 MG PO TABS: 1000.0000 mg | ORAL_TABLET | Freq: Four times a day (QID) | ORAL | 2 refills | Status: AC | PRN

## 2024-02-04 MED ORDER — IBUPROFEN 600 MG PO TABS
600.0000 mg | ORAL_TABLET | Freq: Once | ORAL | Status: AC
  Administered 2024-02-04: 600 mg via ORAL
  Filled 2024-02-04: qty 1

## 2024-02-04 MED ORDER — IBUPROFEN 200 MG PO TABS: 600.0000 mg | ORAL_TABLET | Freq: Three times a day (TID) | ORAL | 2 refills | Status: AC | PRN

## 2024-02-04 MED ORDER — ACETAMINOPHEN 500 MG PO TABS
1000.0000 mg | ORAL_TABLET | Freq: Once | ORAL | Status: AC
  Administered 2024-02-04: 1000 mg via ORAL
  Filled 2024-02-04: qty 2

## 2024-02-04 NOTE — ED Provider Notes (Signed)
 "  [facility] Provider Note    Event Date/Time   First MD Initiated Contact with Patient 02/04/24 [id number]     (approximate)   History   Wrist Pain  Pt fell on out strectched arm while at the skating rink. Endorsing left wrist pain radiating down to the fingers. Denies forearm pain. PMS intact.    HPI Andrew Peters is a 58 y.o. male PMH gastric cancer, schizophrenia, hyperlipidemia, hypertension presents for evaluation of left wrist pain -Patient was skating earlier today, fell backwards landing onto his left wrist.  No head strike or LOC.  Has ongoing left wrist pain since then.  Has not yet taken any pain medications.     Physical Exam   Triage Vital Signs: ED Triage Vitals  Encounter Vitals Group     BP 02/03/24 2205 (!) 140/104     Girls Systolic BP Percentile --      Girls Diastolic BP Percentile --      Boys Systolic BP Percentile --      Boys Diastolic BP Percentile --      Pulse Rate 02/03/24 2205 87     Resp 02/03/24 2205 18     Temp 02/03/24 2205 98.9 F (37.2 C)     Temp Source 02/04/24 0220 Oral     SpO2 02/03/24 2205 98 %     Weight --      Height --      Head Circumference --      Peak Flow --      Pain Score 02/03/24 2156 10     Pain Loc --      Pain Education --      Exclude from Growth Chart --     Most recent vital signs: Vitals:   02/03/24 2205 02/04/24 0220  BP: (!) 140/104 (!) 152/98  Pulse: 87 92  Resp: 18 19  Temp: 98.9 F (37.2 C) 98.1 F (36.7 C)  SpO2: 98% 98%     General: Awake, no distress.  HEENT: Normocephalic, atraumatic CV:  Good peripheral perfusion. RRR, RP 2+ Resp:  Normal effort. LUE:  Full range of motion of all joints though pain with flexing and extending left wrist.  Some tenderness to palpation over dorsum of the wrist though no clear snuffbox tenderness.  Neurovascularly intact.  No wounds.  No deformity.   ED Results / Procedures / Treatments   Labs (all labs ordered are listed,  but only abnormal results are displayed) Labs Reviewed - No data to display   EKG  N/a   RADIOLOGY Radiology interpreted by myself and radiology report reviewed.  No acute pathology identified.    PROCEDURES:  Critical Care performed: No  Procedures   MEDICATIONS ORDERED IN ED: Medications  acetaminophen  (TYLENOL ) tablet 1,000 mg (has no administration in time range)  ibuprofen  (ADVIL ) tablet 600 mg (has no administration in time range)     IMPRESSION / MDM / ASSESSMENT AND PLAN / ED COURSE  I reviewed the triage vital signs and the nursing notes.                              DDX/MDM/AP: Differential diagnosis includes, but is not limited to, sprain versus fracture.  No evidence of other traumatic injuries.  Plan: - X-ray left wrist Tylenol , Motrin   Patient's presentation is most consistent with acute complicated illness / injury requiring diagnostic workup.  ED course below.  X-ray with  no evidence of fracture or dislocation.  Given notable point tenderness, will refer to Ortho and provide removal wrist brace for use in the meantime.  Considered but doubt occult fracture.  Rx Tylenol , Motrin .  Clinical Course as of 02/04/24 0432  Sun Feb 04, 2024  0401 XR L wrist: IMPRESSION: 1. No acute findings.   [MM]    Clinical Course User Index [MM] Clarine Ozell LABOR, MD     FINAL CLINICAL IMPRESSION(S) / ED DIAGNOSES   Final diagnoses:  Sprain of left wrist, unspecified location, initial encounter     Rx / DC Orders   ED Discharge Orders          Ordered    acetaminophen  (TYLENOL ) 500 MG tablet  Every 6 hours PRN        02/04/24 0428    ibuprofen  (MOTRIN  IB) 200 MG tablet  Every 8 hours PRN        02/04/24 0428    Ambulatory referral to Orthopedic Surgery       Comments: Left wrist pain, negative x-ray   02/04/24 0428             Note:  This document was prepared using Dragon voice recognition software and may include unintentional dictation  errors.   Clarine Ozell LABOR, MD 02/04/24 [phone]  "

## 2024-02-04 NOTE — Discharge Instructions (Addendum)
 Your evaluation in the emergency department was overall reassuring.  I suspect you have a sprain of your left wrist but given your notable tenderness I have placed a referral for you to follow-up with an orthopedic provider however, they will contact you to schedule appointment.  Please wear the provided splint in the meantime, and I prescribed Tylenol  and Motrin  to use as needed for any ongoing discomfort.  You should also rest, ice, and elevate your wrist.
